# Patient Record
Sex: Male | Born: 1960 | Race: Black or African American | Hispanic: No | Marital: Married | Smoking: Former smoker
Health system: Southern US, Community
[De-identification: ages and names within clinical notes are randomized; demographics above are authoritative.]

## PROBLEM LIST (undated history)

## (undated) DIAGNOSIS — Z5189 Encounter for other specified aftercare: Secondary | ICD-10-CM

## (undated) DIAGNOSIS — B1920 Unspecified viral hepatitis C without hepatic coma: Secondary | ICD-10-CM

## (undated) DIAGNOSIS — E119 Type 2 diabetes mellitus without complications: Secondary | ICD-10-CM

## (undated) DIAGNOSIS — K760 Fatty (change of) liver, not elsewhere classified: Secondary | ICD-10-CM

## (undated) DIAGNOSIS — E785 Hyperlipidemia, unspecified: Secondary | ICD-10-CM

## (undated) DIAGNOSIS — I10 Essential (primary) hypertension: Secondary | ICD-10-CM

## (undated) DIAGNOSIS — D126 Benign neoplasm of colon, unspecified: Secondary | ICD-10-CM

## (undated) HISTORY — DX: Hyperlipidemia, unspecified: E78.5

## (undated) HISTORY — DX: Fatty (change of) liver, not elsewhere classified: K76.0

## (undated) HISTORY — DX: Benign neoplasm of colon, unspecified: D12.6

## (undated) HISTORY — DX: Encounter for other specified aftercare: Z51.89

## (undated) HISTORY — DX: Unspecified viral hepatitis C without hepatic coma: B19.20

## (undated) HISTORY — PX: COLONOSCOPY: SHX174

## (undated) HISTORY — DX: Type 2 diabetes mellitus without complications: E11.9

## (undated) HISTORY — DX: Essential (primary) hypertension: I10

## (undated) HISTORY — PX: POLYPECTOMY: SHX149

---

## 1998-08-06 ENCOUNTER — Emergency Department (HOSPITAL_COMMUNITY): Admission: EM | Admit: 1998-08-06 | Discharge: 1998-08-06 | Payer: Self-pay | Admitting: Emergency Medicine

## 1998-08-07 ENCOUNTER — Encounter: Payer: Self-pay | Admitting: Emergency Medicine

## 1998-08-13 ENCOUNTER — Ambulatory Visit (HOSPITAL_COMMUNITY): Admission: RE | Admit: 1998-08-13 | Discharge: 1998-08-13 | Payer: Self-pay | Admitting: Orthopedic Surgery

## 2001-04-30 ENCOUNTER — Encounter: Admission: RE | Admit: 2001-04-30 | Discharge: 2001-04-30 | Payer: Self-pay | Admitting: Family Medicine

## 2001-05-02 ENCOUNTER — Encounter: Admission: RE | Admit: 2001-05-02 | Discharge: 2001-05-02 | Payer: Self-pay | Admitting: Family Medicine

## 2005-07-11 ENCOUNTER — Emergency Department (HOSPITAL_COMMUNITY): Admission: EM | Admit: 2005-07-11 | Discharge: 2005-07-11 | Payer: Self-pay | Admitting: Emergency Medicine

## 2007-06-26 ENCOUNTER — Emergency Department (HOSPITAL_COMMUNITY): Admission: EM | Admit: 2007-06-26 | Discharge: 2007-06-26 | Payer: Self-pay | Admitting: Emergency Medicine

## 2007-09-27 ENCOUNTER — Encounter: Payer: Self-pay | Admitting: Gastroenterology

## 2007-10-15 ENCOUNTER — Encounter: Payer: Self-pay | Admitting: Gastroenterology

## 2008-02-21 ENCOUNTER — Ambulatory Visit: Payer: Self-pay | Admitting: Gastroenterology

## 2008-07-08 ENCOUNTER — Ambulatory Visit: Payer: Self-pay | Admitting: Gastroenterology

## 2008-12-01 ENCOUNTER — Ambulatory Visit: Payer: Self-pay | Admitting: Gastroenterology

## 2008-12-01 DIAGNOSIS — K921 Melena: Secondary | ICD-10-CM | POA: Insufficient documentation

## 2008-12-01 DIAGNOSIS — D509 Iron deficiency anemia, unspecified: Secondary | ICD-10-CM | POA: Insufficient documentation

## 2008-12-01 DIAGNOSIS — B182 Chronic viral hepatitis C: Secondary | ICD-10-CM | POA: Insufficient documentation

## 2008-12-29 ENCOUNTER — Telehealth: Payer: Self-pay | Admitting: Gastroenterology

## 2009-02-10 ENCOUNTER — Ambulatory Visit: Payer: Self-pay | Admitting: Gastroenterology

## 2011-07-22 ENCOUNTER — Ambulatory Visit (INDEPENDENT_AMBULATORY_CARE_PROVIDER_SITE_OTHER): Payer: BC Managed Care – PPO

## 2011-07-22 DIAGNOSIS — Z833 Family history of diabetes mellitus: Secondary | ICD-10-CM

## 2011-07-22 DIAGNOSIS — R12 Heartburn: Secondary | ICD-10-CM

## 2011-07-22 DIAGNOSIS — I10 Essential (primary) hypertension: Secondary | ICD-10-CM

## 2011-08-06 ENCOUNTER — Ambulatory Visit (INDEPENDENT_AMBULATORY_CARE_PROVIDER_SITE_OTHER): Payer: BC Managed Care – PPO

## 2011-08-06 DIAGNOSIS — E789 Disorder of lipoprotein metabolism, unspecified: Secondary | ICD-10-CM

## 2011-08-06 DIAGNOSIS — M542 Cervicalgia: Secondary | ICD-10-CM

## 2011-08-06 DIAGNOSIS — B1920 Unspecified viral hepatitis C without hepatic coma: Secondary | ICD-10-CM

## 2011-08-11 ENCOUNTER — Ambulatory Visit (INDEPENDENT_AMBULATORY_CARE_PROVIDER_SITE_OTHER): Payer: BC Managed Care – PPO

## 2011-08-11 DIAGNOSIS — R1011 Right upper quadrant pain: Secondary | ICD-10-CM

## 2011-08-11 DIAGNOSIS — B1920 Unspecified viral hepatitis C without hepatic coma: Secondary | ICD-10-CM

## 2011-08-11 DIAGNOSIS — S139XXA Sprain of joints and ligaments of unspecified parts of neck, initial encounter: Secondary | ICD-10-CM

## 2011-08-11 DIAGNOSIS — Z23 Encounter for immunization: Secondary | ICD-10-CM

## 2011-08-11 DIAGNOSIS — Z0000 Encounter for general adult medical examination without abnormal findings: Secondary | ICD-10-CM

## 2011-08-23 ENCOUNTER — Other Ambulatory Visit: Payer: Self-pay | Admitting: Internal Medicine

## 2011-08-23 DIAGNOSIS — R911 Solitary pulmonary nodule: Secondary | ICD-10-CM

## 2011-08-24 ENCOUNTER — Telehealth: Payer: Self-pay

## 2011-08-24 DIAGNOSIS — I10 Essential (primary) hypertension: Secondary | ICD-10-CM

## 2011-08-24 NOTE — Telephone Encounter (Signed)
Patient is requesting refill on his blood pressure medication.

## 2011-08-25 MED ORDER — LISINOPRIL-HYDROCHLOROTHIAZIDE 10-12.5 MG PO TABS: 1.0000 | ORAL_TABLET | Freq: Every day | ORAL | Status: DC

## 2011-08-30 ENCOUNTER — Other Ambulatory Visit: Payer: Self-pay

## 2011-08-30 ENCOUNTER — Ambulatory Visit
Admission: RE | Admit: 2011-08-30 | Discharge: 2011-08-30 | Disposition: A | Discharge status: Home / Self Care | Payer: Self-pay | Source: Ambulatory Visit | Attending: Internal Medicine | Admitting: Internal Medicine

## 2011-08-30 DIAGNOSIS — R911 Solitary pulmonary nodule: Secondary | ICD-10-CM

## 2011-08-30 MED ORDER — IOHEXOL 300 MG/ML  SOLN
75.0000 mL | Freq: Once | INTRAMUSCULAR | Status: AC | PRN
  Administered 2011-08-30: 75 mL via INTRAVENOUS

## 2011-09-05 ENCOUNTER — Telehealth: Payer: Self-pay

## 2011-09-05 NOTE — Telephone Encounter (Signed)
.  [facility] PT HAD LAB WORK DONE AND WOULD LIKE TO KNOW RESULTS PLEASE CALL [phone]

## 2011-09-05 NOTE — Telephone Encounter (Signed)
Message copied by Kerney Elbe on Mon Sep 05, 2011 12:06 PM ------      Message from: Jonita Albee      Created: Sun Sep 04, 2011  5:21 PM       Please pull chart

## 2011-09-06 NOTE — Telephone Encounter (Signed)
Called [facility] at Dr Ernestene Mention request to have results of test faxed. Received results and gave to Dr Perrin Maltese for review.

## 2011-09-13 ENCOUNTER — Ambulatory Visit (INDEPENDENT_AMBULATORY_CARE_PROVIDER_SITE_OTHER): Payer: BC Managed Care – PPO | Admitting: Internal Medicine

## 2011-09-13 VITALS — BP 155/85 | HR 71 | Temp 98.1°F | Resp 16 | Ht 70.25 in | Wt 195.0 lb

## 2011-09-13 DIAGNOSIS — B182 Chronic viral hepatitis C: Secondary | ICD-10-CM

## 2011-09-13 DIAGNOSIS — Z23 Encounter for immunization: Secondary | ICD-10-CM

## 2011-09-13 DIAGNOSIS — B1920 Unspecified viral hepatitis C without hepatic coma: Secondary | ICD-10-CM

## 2011-09-13 NOTE — Progress Notes (Signed)
  Subjective:    Patient ID: Alejandro [name], male    DOB: May 16, 1961, 51 y.o.   MRN: [id number]  HPI  Needs 2nd Hep B vac.  Review of Systems     Objective:   Physical Exam        Assessment & Plan:   Ordered Hep B vac.  Referral to Hep C clinic in process.

## 2012-01-19 ENCOUNTER — Ambulatory Visit (INDEPENDENT_AMBULATORY_CARE_PROVIDER_SITE_OTHER): Payer: BC Managed Care – PPO | Admitting: Gastroenterology

## 2012-01-19 DIAGNOSIS — B182 Chronic viral hepatitis C: Secondary | ICD-10-CM

## 2012-02-09 NOTE — Progress Notes (Signed)
NAME:  TRAYE, BATES  MR#:  [id number]      DATE:  01/19/2012  DOB:  [birth year]    cc: Primary Care Physician: Same Referring Physician: Robert Bellow, MD, [facility], [address] [place], [place] [address], Fax [phone]    REASON FOR VISIT:  Follow up of genotype 1b hepatitis C.   HISTORY:  It should be noted that the patient is a poor historian. For example, he has been followed by [place] for his genotype 1b hepatitis C with last appointment on 07/08/2008. He was unaware that he was seen here in the past until I reminded him. It will be recalled that he was first seen in our clinic for genotype 1b hepatitis C on 02/21/2008, having been first found to be hepatitis C antibody positive on routine lab work in December 2008. He was genotyped. Treatment was discussed with him at the initial appointment. He returned to see [place] in followup on 07/08/2008, when seen by the nurse practitioner in followup. A liver biopsy was never performed but a hepatitis C virus FibroSURE was performed, which suggested a fibrosis score of 0.4, equivalent to F1-F2 fibrosis. He had declined a liver biopsy. Given the lack of fibrosis on his fiber FibroSURE test, treatment was deferred and he was suppose to come back in a years' time. There is no documentation as to what occurred and he, as mentioned above, did not even remember, he was seen by [place] when I first started discussing his diagnosis.   Currently he has no symptoms referable to his history of hepatitis C. I gather he has been referred back simply because his hepatitis C has not been addressed to date rather than any specific new problem.   PAST MEDICAL HISTORY:  Otherwise significant for hypertension. He previously had a history of iron-deficiency anemia for which he underwent a colonoscopy and endoscopy sometime around 2008 or 2009.   CURRENT MEDICATIONS:  Lisinopril/hydrochlorothiazide 10/12.5 mg daily.   ALLERGIES:  Denies.   HABITS:    Smokes on occasion, rarely consumes alcohol.   REVIEW OF SYSTEMS:  All 10 systems reviewed today on the review of systems form, which was signed and placed in the chart. His CES-D was 4.   PHYSICAL EXAMINATION:  Constitutional: Well appearing without stigmata of chronic liver disease. Vital Signs: Height 69 inches, weight 197 pounds, blood pressure 143/106, pulse of 70, temperature 97.6 Fahrenheit. Ears, Nose, Mouth and Throat:  Unremarkable oropharynx.  No thyromegaly or neck masses.  Chest:  Resonant to percussion.  Clear to auscultation.  Cardiovascular:  Heart sounds normal S1, S2 without murmurs or rubs.  There is no peripheral edema.  Abdomen:  Normal bowel sounds.  No masses or tenderness.  I could not appreciate a liver edge or spleen tip.  I could not appreciate any hernias.  Lymphatics:  No cervical or inguinal lymphadenopathy.  Central Nervous System:  No asterixis or focal neurologic findings.  Dermatologic:  Anicteric without palmar erythema or spider angiomata.  Eyes:  Anicteric sclerae.  Pupils are equal and reactive to light.  laboratories:  Most recent labs in the referral notes from 07/23/2011, his AST was 126, ALT 118, ALP 67, total bilirubin 0.6, albumin 4.3, creatinine 1.03.   On 08/07/2011, his HCV RNA was 325,565 international units per mL, and his genotype was repeated to find again that it was 1b.   His last CBC, that I received was 07/22/2011, with a platelet count of 248.   ASSESSMENT:  The patient is a 51 year old gentleman with history of genotype 1b hepatitis C with a previous liver FibroSURE testing on 07/08/2008, suggesting F1-F2 fibrosis, but he has never been biopsied and he is naive to treatment. I do not see any contraindication to treating him, but at this time now, I am deferring therapy on most patients until the availability of sofosbuvir, which will dramatically reduce the length of treatment and in addition to improving the response rates to therapy.  Otherwise, it would seem that the patient needs vaccination against hepatitis A. He was previously negative for the total A antibody. He brings evidence today that he is on a schedule to finish his hepatitis B vaccination by July 2013.   In my discussion today with the patient, I have discussed his previous lab findings and their significance. We discussed the possibility of a liver biopsy versus waiting until the availability of the next generation direct acting antivirals. We discussed treatment, with direct acting antivirals and the response, after December 2014. He was content to wait.   PLAN:  1. Hepatitis A vaccine #1 given today.  2. He will need to return in 6 months' time for his second hepatitis A vaccine.  3. He is completing his hepatitis B series.  4. I did not draw any lab work today as is there is no need to.  5. He is to return in early 2014 to review his therapeutic options at that time.               Brooke Dare, MD   [phone]  D:  Thu Jun 27 18:57:07 2013 ; T:  Thu Jun 27 22:46:52 2013  Job #:  [id number]

## 2012-02-26 ENCOUNTER — Ambulatory Visit (INDEPENDENT_AMBULATORY_CARE_PROVIDER_SITE_OTHER): Payer: BC Managed Care – PPO | Admitting: Family Medicine

## 2012-02-26 ENCOUNTER — Ambulatory Visit: Payer: BC Managed Care – PPO

## 2012-02-26 VITALS — BP 146/80 | HR 76 | Temp 98.4°F | Resp 12 | Ht 69.5 in | Wt 198.0 lb

## 2012-02-26 DIAGNOSIS — M549 Dorsalgia, unspecified: Secondary | ICD-10-CM

## 2012-02-26 DIAGNOSIS — Z23 Encounter for immunization: Secondary | ICD-10-CM

## 2012-02-26 MED ORDER — METHOCARBAMOL 750 MG PO TABS: 750.0000 mg | ORAL_TABLET | Freq: Four times a day (QID) | ORAL | Status: AC

## 2012-02-26 MED ORDER — HEPATITIS B VAC RECOMBINANT 5 MCG/0.5ML IJ SUSP
0.5000 mL | Freq: Once | INTRAMUSCULAR | Status: AC
  Administered 2012-02-26: 5 ug via INTRAMUSCULAR

## 2012-02-26 NOTE — Progress Notes (Signed)
[facility] [address] [place] [address] [phone]  Date:  02/26/2012   Name:  Alejandro [name]   DOB:  07-31-1960   MRN:  [id number]  PCP:  Tally Due, MD    Chief Complaint: Back Pain   History of Present Illness:  Alejandro [name] is a 51 y.o. very pleasant male patient who presents with the following:  This past Monday (today is Sunday) he helped his daughter to move to a 3rd floor apartment.  He "overdid it" and hurt his back.  He noted pain while moving the furniture if he moved a certain way.  He continues to have pain in his lower back, but it does not radiate to his legs.  No numbness or weakness in his legs, no incontinence. He has occasionally had a backache with certain activities such as refinishing a floor, but never this bad.  He has used some ibuprofen- however this did not seem to help much.  Josph also has a history of Hep C.  He is being seen by the ID clinic.  He is in the process of completing his Hep B and Hep A vaccine series.  He had Hep B vaccine in 3/10, and then restarted the series with doses in January and February of this year.  He just had his 2nd Hep A shot at the ID clinic in June  Patient Active Problem List  Diagnosis  . HEPATITIS C-CHRONIC WITHOUT COMA  . ANEMIA, IRON DEFICIENCY  . BLOOD IN STOOL    Past Medical History  Diagnosis Date  . Hepatitis C   . Hypertension     No past surgical history on file.  History  Substance Use Topics  . Smoking status: Current Some Day Smoker  . Smokeless tobacco: Never Used  . Alcohol Use: Not on file    No family history on file.  Allergies  Allergen Reactions  . Tylenol (Acetaminophen) Hypertension    Medication list has been reviewed and updated.  Current Outpatient Prescriptions on File Prior to Visit  Medication Sig Dispense Refill  . lisinopril-hydrochlorothiazide (PRINZIDE,ZESTORETIC) 10-12.5 MG per tablet Take 1 tablet by mouth daily.  90  tablet  3    Review of Systems:  As per HPI- otherwise negative.   Physical Examination: Filed Vitals:   02/26/12 1142  BP: 146/80  Pulse: 76  Temp: 98.4 F (36.9 C)  Resp: 12   Filed Vitals:   02/26/12 1142  Height: 5' 9.5" (1.765 m)  Weight: 198 lb (89.812 kg)   Body mass index is 28.82 kg/(m^2). Ideal Body Weight: Weight in (lb) to have BMI = 25: 171.4   GEN: WDWN, NAD, Non-toxic, A & O x 3 HEENT: Atraumatic, Normocephalic. Neck supple. No masses, No LAD. Ears and Nose: No external deformity. CV: RRR, No M/G/R. No JVD. No thrill. No extra heart sounds. PULM: CTA B, no wheezes, crackles, rhonchi. No retractions. No resp. distress. No accessory muscle use. ABD: S, NT, ND, +BS. No rebound. No HSM. EXTR: No c/c/e.  NEURO Normal gait.  Normal strength and sensation both legs, normal patellar DTR bilaterally. Negative straight leg raise.  He has tenderness in his lumbar and thoracic Paraspinous muscles, and stiffness with flexion and extension.  PSYCH: Normally interactive. Conversant. Not depressed or anxious appearing.  Calm demeanor.   [facility] reading (PRIMARY) by  Dr. Patsy Lager. L spine: negative T spine: negative  THORACIC SPINE - 2 VIEW  Comparison: No priors.  Findings: AP, lateral and swimmers lateral views of the thoracic spine demonstrate no definite acute displaced fractures or compression type fractures. Alignment is anatomic. Visualized portions of the thorax are unremarkable.  IMPRESSION: 1. No acute radiographic abnormality of the thoracic spine to account for the patient's symptoms.  LUMBAR SPINE - 2-3 VIEW  Comparison: None  Findings: There is no evidence of lumbar spine fracture. Alignment is normal. Intervertebral disc spaces are maintained. Multiple stones identified within the gallbladder.  IMPRESSION:  1. No acute lumbar spine findings. 2. Gallstones.  Assessment and Plan: 1. Back pain  DG Lumbar Spine 2-3 Views, DG Thoracic Spine 2  View, methocarbamol (ROBAXIN-750) 750 MG tablet  2. Viral hepatitis vaccination  hepatitis B vac recombinant (RECOMBIVAX) injection 5 mcg   Will treat with robaxin as above for a back strain- this should be safe with his history of elevated LFTs, and he has used it in the past.  Let me know if he is not better within a few days- Sooner if worse.   Gave Hep B number 3- I had actually not been aware that he received another dose in February of this year, which would have equaled 3 doses counting his dose in 2010.  However, the extra dose will not be harmful to him.  Let him know about this extra dose and discussed the gallstones noted on his xray above. Explained that he needs to watch out for pain after eating, especially high fat foods as this may indicate gallbladder pain.   Abbe Amsterdam, MD

## 2012-10-04 ENCOUNTER — Other Ambulatory Visit: Payer: Self-pay | Admitting: Physician Assistant

## 2013-01-13 ENCOUNTER — Ambulatory Visit (INDEPENDENT_AMBULATORY_CARE_PROVIDER_SITE_OTHER): Payer: BC Managed Care – PPO | Admitting: Family Medicine

## 2013-01-13 VITALS — BP 152/96 | HR 91 | Temp 99.0°F | Resp 18 | Wt 202.0 lb

## 2013-01-13 DIAGNOSIS — B86 Scabies: Secondary | ICD-10-CM

## 2013-01-13 MED ORDER — IVERMECTIN 3 MG PO TABS: 3.0000 mg | ORAL_TABLET | Freq: Once | ORAL | Status: DC

## 2013-01-13 MED ORDER — PREDNISONE 20 MG PO TABS: ORAL_TABLET | ORAL | Status: DC

## 2013-01-13 NOTE — Patient Instructions (Addendum)
Scabies  Scabies are small bugs (mites) that burrow under the skin and cause red bumps and severe itching. These bugs can only be seen with a microscope. Scabies are highly contagious. They can spread easily from person to person by direct contact. They are also spread through sharing clothing or linens that have the scabies mites living in them. It is not unusual for an entire family to become infected through shared towels, clothing, or bedding.   HOME CARE INSTRUCTIONS   · Your caregiver may prescribe a cream or lotion to kill the mites. If cream is prescribed, massage the cream into the entire body from the neck to the bottom of both feet. Also massage the cream into the scalp and face if your child is less than 1 year old. Avoid the eyes and mouth. Do not wash your hands after application.  · Leave the cream on for 8 to 12 hours. Your child should bathe or shower after the 8 to 12 hour application period. Sometimes it is helpful to apply the cream to your child right before bedtime.  · One treatment is usually effective and will eliminate approximately 95% of infestations. For severe cases, your caregiver may decide to repeat the treatment in 1 week. Everyone in your household should be treated with one application of the cream.  · New rashes or burrows should not appear within 24 to 48 hours after successful treatment. However, the itching and rash may last for 2 to 4 weeks after successful treatment. Your caregiver may prescribe a medicine to help with the itching or to help the rash go away more quickly.  · Scabies can live on clothing or linens for up to 3 days. All of your child's recently used clothing, towels, stuffed toys, and bed linens should be washed in hot water and then dried in a dryer for at least 20 minutes on high heat. Items that cannot be washed should be enclosed in a plastic bag for at least 3 days.  · To help relieve itching, bathe your child in a cool bath or apply cool washcloths to the  affected areas.  · Your child may return to school after treatment with the prescribed cream.  SEEK MEDICAL CARE IF:   · The itching persists longer than 4 weeks after treatment.  · The rash spreads or becomes infected. Signs of infection include red blisters or yellow-tan crust.  Document Released: 07/11/2005 Document Revised: 10/03/2011 Document Reviewed: 11/19/2008  Exit[employer]® Patient Information ©2014 [employer].

## 2013-01-13 NOTE — Progress Notes (Signed)
52 year old gentleman with 2 weeks of itching on his forearms and in genitalia and lower abdomen. He's not had any recent sexual relations. This itching developed after working in the yard and patient originally but he had poison ivy, but he never developed full blown poison ivy rash.  Objective: A couple small interdigital papules, a few scattered papules on the forearms, and scattered papules in the inguinal and groin region.  Assessment: This is most consistent with scabies.  Scabies - Plan: ivermectin (STROMECTOL) 3 MG TABS, predniSONE (DELTASONE) 20 MG tablet  Signed, Elvina Sidle, MD

## 2013-01-14 ENCOUNTER — Telehealth: Payer: Self-pay

## 2013-01-14 NOTE — Telephone Encounter (Signed)
Which

## 2013-01-14 NOTE — Telephone Encounter (Signed)
Spoke to him about the medication, he is advised to take all four at once.

## 2013-01-14 NOTE — Telephone Encounter (Signed)
PT WOULD LIKE TO KNOW THE DIRECTIONS ON HOW TO TAKE HIS MEDICINE. PLEASE CALL [phone]

## 2013-01-17 ENCOUNTER — Telehealth: Payer: Self-pay

## 2013-01-17 NOTE — Telephone Encounter (Signed)
Pt called and is concerned that the medication that we gave him is making his tongue white. He wants to make sure that this is a normal side effect. Please call  [phone]

## 2013-01-18 NOTE — Telephone Encounter (Signed)
I do not think this is a SE - would recommend an OV

## 2013-01-18 NOTE — Telephone Encounter (Signed)
Pt advised.

## 2013-02-21 ENCOUNTER — Other Ambulatory Visit: Payer: Self-pay | Admitting: Physician Assistant

## 2013-02-24 NOTE — Telephone Encounter (Signed)
Patient is requesting a refill on his Lisinopril states he just went to [facility] and they told him there was no refill for him. Patient would like [place] to send in a refill to [facility] @ [address]. Thanks

## 2013-02-25 ENCOUNTER — Telehealth: Payer: Self-pay

## 2013-02-25 MED ORDER — LISINOPRIL-HYDROCHLOROTHIAZIDE 10-12.5 MG PO TABS: 1.0000 | ORAL_TABLET | Freq: Every day | ORAL | Status: DC

## 2013-02-25 NOTE — Telephone Encounter (Signed)
This was done on 7/31 called him to advise. Resent. He needs appt.

## 2013-02-25 NOTE — Telephone Encounter (Signed)
Patient needs a refill on Lisinopril. [name] [address].  [phone]

## 2013-04-12 ENCOUNTER — Telehealth: Payer: Self-pay

## 2013-04-12 MED ORDER — LISINOPRIL-HYDROCHLOROTHIAZIDE 10-12.5 MG PO TABS: 1.0000 | ORAL_TABLET | Freq: Every day | ORAL | Status: DC

## 2013-04-12 NOTE — Telephone Encounter (Signed)
Pt needs refill on lisinopril, blood pressure rx,.  Uses [facility] on [address].  Last time had a 3 month supply that was less expensive. Would like to do that again if possible.  Call at [phone].

## 2013-04-12 NOTE — Telephone Encounter (Signed)
The last Rx was for #30, because the patient is due for a refill. #90 sent, but need OV for additional refills.  Meds ordered this encounter  Medications  . lisinopril-hydrochlorothiazide (PRINZIDE,ZESTORETIC) 10-12.5 MG per tablet    Sig: Take 1 tablet by mouth daily. Needs office visit/labs, 2nd notice    Dispense:  90 tablet    Refill:  0    Order Specific Question:  Supervising Provider    Answer:  DOOLITTLE, ROBERT P [[id number]]

## 2013-04-12 NOTE — Telephone Encounter (Signed)
Spoke with pt advised to RTC but #90 was sent to pharmacy.

## 2013-07-31 ENCOUNTER — Telehealth: Payer: Self-pay

## 2013-07-31 NOTE — Telephone Encounter (Signed)
Patient states that he needs a refill on Lisinopril sent to [facility] on [address]

## 2013-07-31 NOTE — Telephone Encounter (Signed)
No, he needs an office visit. Called him to advise.

## 2013-08-01 ENCOUNTER — Ambulatory Visit (INDEPENDENT_AMBULATORY_CARE_PROVIDER_SITE_OTHER): Payer: BC Managed Care – PPO | Admitting: Emergency Medicine

## 2013-08-01 ENCOUNTER — Encounter: Payer: Self-pay | Admitting: Emergency Medicine

## 2013-08-01 VITALS — BP 142/80 | HR 89 | Temp 98.7°F | Resp 18 | Ht 69.5 in | Wt 207.0 lb

## 2013-08-01 DIAGNOSIS — B1920 Unspecified viral hepatitis C without hepatic coma: Secondary | ICD-10-CM

## 2013-08-01 DIAGNOSIS — I10 Essential (primary) hypertension: Secondary | ICD-10-CM

## 2013-08-01 MED ORDER — LISINOPRIL-HYDROCHLOROTHIAZIDE 20-25 MG PO TABS: 1.0000 | ORAL_TABLET | Freq: Every day | ORAL | Status: DC

## 2013-08-01 MED ORDER — LANSOPRAZOLE 30 MG PO CPDR
30.0000 mg | DELAYED_RELEASE_CAPSULE | Freq: Every day | ORAL | Status: DC
Start: 2013-08-01 — End: 2018-08-02

## 2013-08-01 NOTE — Progress Notes (Signed)
[facility] [address] [place] [address] [phone]  Date:  08/01/2013   Name:  Alejandro [name]   DOB:  30-Apr-1961   MRN:  [id number]  PCP:  Kennon Portela, MD    Chief Complaint: rx refills and Gastrophageal Reflux   History of Present Illness:  Alejandro [name] is a 53 y.o. very pleasant male patient who presents with the following:  Frequent heartburn and now is belching a lot.  Heavy coffee drinker.  Recently started a second job and is drinking a lot of carbonated caffeinated beverages.  Frequently awakening with heartburn.  No waterbrash.  Worse with spicy food and seasonings.  No nausea or vomiting.   Stopped smoking.  No improvement with over the counter medications or other home remedies. Denies other complaint or health concern today.   Patient Active Problem List   Diagnosis Date Noted  . HEPATITIS C-CHRONIC WITHOUT COMA 12/01/2008  . ANEMIA, IRON DEFICIENCY 12/01/2008  . BLOOD IN STOOL 12/01/2008    Past Medical History  Diagnosis Date  . Hepatitis C   . Hypertension     History reviewed. No pertinent past surgical history.  History  Substance Use Topics  . Smoking status: Current Some Day Smoker  . Smokeless tobacco: Never Used  . Alcohol Use: Yes    Family History  Problem Relation Age of Onset  . Heart disease Mother     Allergies  Allergen Reactions  . Tylenol [Acetaminophen] Hypertension    Medication list has been reviewed and updated.  Current Outpatient Prescriptions on File Prior to Visit  Medication Sig Dispense Refill  . lisinopril-hydrochlorothiazide (PRINZIDE,ZESTORETIC) 10-12.5 MG per tablet Take 1 tablet by mouth daily. Needs office visit/labs, 2nd notice  90 tablet  0  . ivermectin (STROMECTOL) 3 MG TABS Take 1 tablet (3 mg total) by mouth once.  4 tablet  0  . predniSONE (DELTASONE) 20 MG tablet 2 daily with food  10 tablet  1   No current facility-administered medications on file prior to  visit.    Review of Systems:  As per HPI, otherwise negative.  3  Physical Examination: Filed Vitals:   08/01/13 1831  BP: 142/80  Pulse: 89  Temp: 98.7 F (37.1 C)  Resp: 18   Filed Vitals:   08/01/13 1831  Height: 5' 9.5" (1.765 m)  Weight: 207 lb (93.895 kg)   Body mass index is 30.14 kg/(m^2). Ideal Body Weight: Weight in (lb) to have BMI = 25: 171.4  GEN: WDWN, NAD, Non-toxic, A & O x 3 HEENT: Atraumatic, Normocephalic. Neck supple. No masses, No LAD. Ears and Nose: No external deformity. CV: RRR, No M/G/R. No JVD. No thrill. No extra heart sounds. PULM: CTA B, no wheezes, crackles, rhonchi. No retractions. No resp. distress. No accessory muscle use. ABD: S, NT, ND, +BS. No rebound. No HSM. EXTR: No c/c/e NEURO Normal gait.  PSYCH: Normally interactive. Conversant. Not depressed or anxious appearing.  Calm demeanor.    Assessment and Plan: GERD Hypertension Future labs Increase lisinoril to 20/25  Signed,  Ellison Carwin, MD

## 2013-11-02 ENCOUNTER — Other Ambulatory Visit (INDEPENDENT_AMBULATORY_CARE_PROVIDER_SITE_OTHER): Payer: BC Managed Care – PPO | Admitting: *Deleted

## 2013-11-02 DIAGNOSIS — B1920 Unspecified viral hepatitis C without hepatic coma: Secondary | ICD-10-CM

## 2013-11-02 DIAGNOSIS — B150 Hepatitis A with hepatic coma: Secondary | ICD-10-CM

## 2013-11-02 DIAGNOSIS — I10 Essential (primary) hypertension: Secondary | ICD-10-CM

## 2013-11-02 LAB — LIPID PANEL
Cholesterol: 139 mg/dL (ref 0–200)
HDL: 37 mg/dL — AB (ref >39)
LDL Cholesterol: 53 mg/dL (ref 0–99)
TRIGLYCERIDES: 246 mg/dL — AB (ref <150)
Total CHOL/HDL Ratio: 3.8 Ratio
VLDL: 49 mg/dL — ABNORMAL HIGH (ref 0–40)

## 2013-11-02 LAB — CBC WITH DIFFERENTIAL/PLATELET

## 2013-11-02 LAB — COMPREHENSIVE METABOLIC PANEL
ALBUMIN: 4 g/dL (ref 3.5–5.2)
ALT: 58 U/L — AB (ref 0–53)
AST: 68 U/L — ABNORMAL HIGH (ref 0–37)
Alkaline Phosphatase: 64 U/L (ref 39–117)
BILIRUBIN TOTAL: 0.4 mg/dL (ref 0.2–1.2)
BUN: 12 mg/dL (ref 6–23)
CHLORIDE: 100 meq/L (ref 96–112)
CO2: 26 meq/L (ref 19–32)
Calcium: 9.3 mg/dL (ref 8.4–10.5)
Creat: 1.09 mg/dL (ref 0.50–1.35)
GLUCOSE: 111 mg/dL — AB (ref 70–99)
POTASSIUM: 4.3 meq/L (ref 3.5–5.3)
SODIUM: 136 meq/L (ref 135–145)
TOTAL PROTEIN: 7.2 g/dL (ref 6.0–8.3)

## 2013-11-02 LAB — PSA: PSA: 0.5 ng/mL (ref <=4.00)

## 2013-11-02 NOTE — Progress Notes (Signed)
Pt here for labs only. 

## 2013-11-03 ENCOUNTER — Telehealth: Payer: Self-pay | Admitting: *Deleted

## 2013-11-04 LAB — H. PYLORI ANTIBODY, IGG: H Pylori IgG: >8 {ISR} — ABNORMAL HIGH

## 2013-11-05 ENCOUNTER — Other Ambulatory Visit: Payer: Self-pay | Admitting: Emergency Medicine

## 2013-11-05 MED ORDER — AMOXICILL-CLARITHRO-LANSOPRAZ PO MISC: Freq: Two times a day (BID) | ORAL | Status: DC

## 2013-11-05 MED ORDER — ATORVASTATIN CALCIUM 20 MG PO TABS: 20.0000 mg | ORAL_TABLET | Freq: Every day | ORAL | Status: DC

## 2013-11-12 NOTE — Telephone Encounter (Signed)
error 

## 2014-01-20 ENCOUNTER — Ambulatory Visit (INDEPENDENT_AMBULATORY_CARE_PROVIDER_SITE_OTHER): Payer: BC Managed Care – PPO | Admitting: Family Medicine

## 2014-01-20 VITALS — BP 124/80 | HR 68 | Temp 98.1°F | Resp 16 | Ht 69.0 in | Wt 207.2 lb

## 2014-01-20 DIAGNOSIS — E785 Hyperlipidemia, unspecified: Secondary | ICD-10-CM

## 2014-01-20 DIAGNOSIS — T148 Other injury of unspecified body region: Secondary | ICD-10-CM

## 2014-01-20 DIAGNOSIS — IMO0002 Reserved for concepts with insufficient information to code with codable children: Secondary | ICD-10-CM

## 2014-01-20 DIAGNOSIS — L03114 Cellulitis of left upper limb: Secondary | ICD-10-CM

## 2014-01-20 DIAGNOSIS — B1920 Unspecified viral hepatitis C without hepatic coma: Secondary | ICD-10-CM

## 2014-01-20 DIAGNOSIS — W57XXXA Bitten or stung by nonvenomous insect and other nonvenomous arthropods, initial encounter: Secondary | ICD-10-CM

## 2014-01-20 LAB — LIPID PANEL
Cholesterol: 136 mg/dL (ref 0–200)
HDL: 46 mg/dL (ref >39)
LDL Cholesterol: 74 mg/dL (ref 0–99)
Total CHOL/HDL Ratio: 3 Ratio
Triglycerides: 80 mg/dL (ref <150)
VLDL: 16 mg/dL (ref 0–40)

## 2014-01-20 LAB — COMPLETE METABOLIC PANEL WITH GFR
ALT: 63 U/L — ABNORMAL HIGH (ref 0–53)
AST: 81 U/L — ABNORMAL HIGH (ref 0–37)
Albumin: 4.3 g/dL (ref 3.5–5.2)
Alkaline Phosphatase: 58 U/L (ref 39–117)
CO2: 28 mEq/L (ref 19–32)
Creat: 1.06 mg/dL (ref 0.50–1.35)
GFR, Est African American: >89 mL/min
Total Bilirubin: 0.4 mg/dL (ref 0.2–1.2)

## 2014-01-20 LAB — COMPLETE METABOLIC PANEL WITHOUT GFR
BUN: 14 mg/dL (ref 6–23)
Calcium: 9.1 mg/dL (ref 8.4–10.5)
Chloride: 101 meq/L (ref 96–112)
GFR, Est Non African American: 80 mL/min
Glucose, Bld: 91 mg/dL (ref 70–99)
Potassium: 5.1 meq/L (ref 3.5–5.3)
Sodium: 135 meq/L (ref 135–145)
Total Protein: 7.1 g/dL (ref 6.0–8.3)

## 2014-01-20 MED ORDER — DOXYCYCLINE HYCLATE 100 MG PO TABS: 100.0000 mg | ORAL_TABLET | Freq: Two times a day (BID) | ORAL | Status: DC

## 2014-01-20 NOTE — Progress Notes (Signed)
Chief Complaint:  Chief Complaint  Patient presents with  . tick bite    Friday, left upper arm is red    HPI: Alejandro [name] is a 53 y.o. male who is here for a Tick bite on his left axilla x 3 days ago He has picked out the tick with the head included, he has also subsequently  Picked at it with tweezers since he thought there was a white material that he msised the firest time He put Alcohol on it and also poison ivy cream without much relief He is here because the area had gotten more red and has expanded Denies fevers or chills, n/n, arm pain or swelling  He has hepatitis, which he has seen a hepatologist for a long time ago but they are no longer here, denies any IVDA,  He thinks he may have gotten it from a blood transfusion in [place] at [facility] when he was a child He was recently put on statin meds about 2-3 months ago and has not come back for f/u of his liver enzymes He is tolerating statins ok, is compliant.   Past Medical History  Diagnosis Date  . Hepatitis C   . Hypertension    History reviewed. No pertinent past surgical history. History   Social History  . Marital Status: Married    Spouse Name: N/A    Number of Children: N/A  . Years of Education: N/A   Social History Main Topics  . Smoking status: Former Research scientist (life sciences)  . Smokeless tobacco: Never Used  . Alcohol Use: Yes  . Drug Use: No  . Sexual Activity: Yes   Other Topics Concern  . None   Social History Narrative  . None   Family History  Problem Relation Age of Onset  . Heart disease Mother    Allergies  Allergen Reactions  . Tylenol [Acetaminophen] Hypertension   Prior to Admission medications   Medication Sig Start Date End Date Taking? Authorizing Benigna Delisi  atorvastatin (LIPITOR) 20 MG tablet Take 1 tablet (20 mg total) by mouth daily. 11/05/13  Yes Ellison Carwin, MD  amoxicillin-clarithromycin-lansoprazole [facility]) combo pack Take by mouth 2 (two) times daily.  Follow package directions.  Please dispense as generic components. 11/05/13   Ellison Carwin, MD  ivermectin (STROMECTOL) 3 MG TABS Take 1 tablet (3 mg total) by mouth once. 01/13/13   Robyn Haber, MD  lansoprazole (PREVACID) 30 MG capsule Take 1 capsule (30 mg total) by mouth daily at 12 noon. 08/01/13   Ellison Carwin, MD  lisinopril-hydrochlorothiazide (PRINZIDE,ZESTORETIC) 20-25 MG per tablet Take 1 tablet by mouth daily. 08/01/13   Ellison Carwin, MD  predniSONE (DELTASONE) 20 MG tablet 2 daily with food 01/13/13   Robyn Haber, MD     ROS: The patient denies fevers, chills, night sweats, unintentional weight loss, chest pain, palpitations, wheezing, dyspnea on exertion, nausea, vomiting, abdominal pain, dysuria, hematuria, melena, numbness, weakness, or tingling.   All other systems have been reviewed and were otherwise negative with the exception of those mentioned in the HPI and as above.    PHYSICAL EXAM: Filed Vitals:   01/20/14 0924  BP: 124/80  Pulse: 68  Temp: 98.1 F (36.7 C)  Resp: 16   Filed Vitals:   01/20/14 0924  Height: 5\' 9"  (1.753 m)  Weight: 207 lb 3.2 oz (93.985 kg)   Body mass index is 30.58 kg/(m^2).  General: Alert, no acute distress HEENT:  Normocephalic, atraumatic, oropharynx patent.  EOMI, PERRLA Cardiovascular:  Regular rate and rhythm, no rubs murmurs or gallops.  No Carotid bruits, radial pulse intact. No pedal edema.  Respiratory: Clear to auscultation bilaterally.  No wheezes, rales, or rhonchi.  No cyanosis, no use of accessory musculature GI: No organomegaly, abdomen is soft and non-tender, positive bowel sounds.  No masses. Skin: + cellulitic rash left axilla, no abscess Neurologic: Facial musculature symmetric. Psychiatric: Patient is appropriate throughout our interaction. Lymphatic: No cervical lymphadenopathy Musculoskeletal: Gait intact.   LABS: Results for orders placed in visit on 11/02/13  CBC WITH DIFFERENTIAL      Result  Value Ref Range   WBC TEST NOT PERFORMED  4.0 - 10.5 K/uL   RBC TEST NOT PERFORMED  4.22 - 5.81 MIL/uL   Hemoglobin TEST NOT PERFORMED  13.0 - 17.0 g/dL   HCT TEST NOT PERFORMED  39.0 - 52.0 %   MCV TEST NOT PERFORMED  78.0 - 100.0 fL   MCH TEST NOT PERFORMED  26.0 - 34.0 pg   MCHC TEST NOT PERFORMED  30.0 - 36.0 g/dL   RDW TEST NOT PERFORMED  11.5 - 15.5 %   Platelets TEST NOT PERFORMED  150 - 400 K/uL   Neutrophils Relative % TEST NOT PERFORMED  43 - 77 %   Neutro Abs TEST NOT PERFORMED  1.7 - 7.7 K/uL   Lymphocytes Relative TEST NOT PERFORMED  12 - 46 %   Lymphs Abs TEST NOT PERFORMED  0.7 - 4.0 K/uL   Monocytes Relative TEST NOT PERFORMED  3 - 12 %   Monocytes Absolute TEST NOT PERFORMED  0.1 - 1.0 K/uL   Eosinophils Relative TEST NOT PERFORMED  0 - 5 %   Eosinophils Absolute TEST NOT PERFORMED  0.0 - 0.7 K/uL   Basophils Relative TEST NOT PERFORMED  0 - 1 %   Basophils Absolute TEST NOT PERFORMED  0.0 - 0.1 K/uL   Smear Review TEST NOT PERFORMED    COMPREHENSIVE METABOLIC PANEL      Result Value Ref Range   Sodium 136  135 - 145 mEq/L   Potassium 4.3  3.5 - 5.3 mEq/L   Chloride 100  96 - 112 mEq/L   CO2 26  19 - 32 mEq/L   Glucose, Bld 111 (*) 70 - 99 mg/dL   BUN 12  6 - 23 mg/dL   Creat 1.09  0.50 - 1.35 mg/dL   Total Bilirubin 0.4  0.2 - 1.2 mg/dL   Alkaline Phosphatase 64  39 - 117 U/L   AST 68 (*) 0 - 37 U/L   ALT 58 (*) 0 - 53 U/L   Total Protein 7.2  6.0 - 8.3 g/dL   Albumin 4.0  3.5 - 5.2 g/dL   Calcium 9.3  8.4 - 10.5 mg/dL  LIPID PANEL      Result Value Ref Range   Cholesterol 139  0 - 200 mg/dL   Triglycerides 246 (*) <150 mg/dL   HDL 37 (*) >39 mg/dL   Total CHOL/HDL Ratio 3.8     VLDL 49 (*) 0 - 40 mg/dL   LDL Cholesterol 53  0 - 99 mg/dL  PSA      Result Value Ref Range   PSA 0.50  <=4.00 ng/mL  H. PYLORI ANTIBODY, IGG      Result Value Ref Range   H Pylori IgG >8.00 (*)      EKG/XRAY:   Primary read interpreted by Dr. Marin Comment at  [facility].  ASSESSMENT/PLAN: Encounter Diagnoses  Name Primary?  . Tick bite Yes  . Other and unspecified hyperlipidemia   . Hepatitis C virus infection without hepatic coma, unspecified chronicity   . Cellulitis of left upper extremity    Doxycycline 100 mg BID for tick bite Refer to [place] pending: CMP I am worried that he is on a statin while having hep c that has been unmanaged/treated. He has high TGs and low HDL but TC and LDL are WNL. May consider taking him off statin if LFTs are elevated F/u prn  Gross sideeffects, risk and benefits, and alternatives of medications d/w patient. Patient is aware that all medications have potential sideeffects and we are unable to predict every sideeffect or drug-drug interaction that may occur.  LE, [place], DO 01/20/2014 2:21 PM

## 2014-01-21 ENCOUNTER — Encounter: Payer: Self-pay | Admitting: Family Medicine

## 2014-02-03 ENCOUNTER — Other Ambulatory Visit: Payer: BC Managed Care – PPO

## 2014-08-27 ENCOUNTER — Other Ambulatory Visit: Payer: Self-pay | Admitting: Emergency Medicine

## 2014-08-31 ENCOUNTER — Other Ambulatory Visit: Payer: Self-pay | Admitting: Emergency Medicine

## 2014-09-14 ENCOUNTER — Emergency Department (HOSPITAL_COMMUNITY)
Admission: EM | Admit: 2014-09-14 | Discharge: 2014-09-14 | Disposition: A | Discharge status: Home / Self Care | Payer: BC Managed Care – PPO | Attending: Emergency Medicine | Admitting: Emergency Medicine

## 2014-09-14 ENCOUNTER — Ambulatory Visit (INDEPENDENT_AMBULATORY_CARE_PROVIDER_SITE_OTHER): Payer: BC Managed Care – PPO

## 2014-09-14 ENCOUNTER — Encounter (HOSPITAL_COMMUNITY): Payer: Self-pay | Admitting: Emergency Medicine

## 2014-09-14 ENCOUNTER — Ambulatory Visit (INDEPENDENT_AMBULATORY_CARE_PROVIDER_SITE_OTHER): Payer: BC Managed Care – PPO | Admitting: Emergency Medicine

## 2014-09-14 VITALS — BP 142/88 | HR 95 | Temp 98.0°F | Resp 16 | Ht 69.0 in | Wt 212.8 lb

## 2014-09-14 DIAGNOSIS — M5442 Lumbago with sciatica, left side: Secondary | ICD-10-CM

## 2014-09-14 DIAGNOSIS — Z8619 Personal history of other infectious and parasitic diseases: Secondary | ICD-10-CM | POA: Diagnosis not present

## 2014-09-14 DIAGNOSIS — E1165 Type 2 diabetes mellitus with hyperglycemia: Secondary | ICD-10-CM | POA: Insufficient documentation

## 2014-09-14 DIAGNOSIS — K8020 Calculus of gallbladder without cholecystitis without obstruction: Secondary | ICD-10-CM | POA: Insufficient documentation

## 2014-09-14 DIAGNOSIS — I10 Essential (primary) hypertension: Secondary | ICD-10-CM | POA: Diagnosis not present

## 2014-09-14 DIAGNOSIS — Z87891 Personal history of nicotine dependence: Secondary | ICD-10-CM | POA: Diagnosis not present

## 2014-09-14 DIAGNOSIS — R350 Frequency of micturition: Secondary | ICD-10-CM

## 2014-09-14 DIAGNOSIS — Z79899 Other long term (current) drug therapy: Secondary | ICD-10-CM | POA: Insufficient documentation

## 2014-09-14 DIAGNOSIS — R739 Hyperglycemia, unspecified: Secondary | ICD-10-CM | POA: Insufficient documentation

## 2014-09-14 DIAGNOSIS — Z792 Long term (current) use of antibiotics: Secondary | ICD-10-CM | POA: Diagnosis not present

## 2014-09-14 DIAGNOSIS — E119 Type 2 diabetes mellitus without complications: Secondary | ICD-10-CM

## 2014-09-14 LAB — BASIC METABOLIC PANEL
Anion gap: 7 (ref 5–15)
BUN: 23 mg/dL (ref 6–23)
CO2: 28 mmol/L (ref 19–32)
Calcium: 9.1 mg/dL (ref 8.4–10.5)
Chloride: 93 mmol/L — ABNORMAL LOW (ref 96–112)
Creatinine, Ser: 1.31 mg/dL (ref 0.50–1.35)
GFR calc Af Amer: 70 mL/min — ABNORMAL LOW (ref >90)
GFR calc non Af Amer: 60 mL/min — ABNORMAL LOW (ref >90)
Glucose, Bld: 514 mg/dL — ABNORMAL HIGH (ref 70–99)
Potassium: 4.5 mmol/L (ref 3.5–5.1)
SODIUM: 128 mmol/L — AB (ref 135–145)

## 2014-09-14 LAB — COMPREHENSIVE METABOLIC PANEL
ALK PHOS: 81 U/L (ref 39–117)
ALT: 61 U/L — ABNORMAL HIGH (ref 0–53)
ANION GAP: 10 (ref 5–15)
AST: 57 U/L — ABNORMAL HIGH (ref 0–37)
Albumin: 4.8 g/dL (ref 3.5–5.2)
BILIRUBIN TOTAL: 0.7 mg/dL (ref 0.3–1.2)
BUN: 26 mg/dL — ABNORMAL HIGH (ref 6–23)
CALCIUM: 10.1 mg/dL (ref 8.4–10.5)
CHLORIDE: 86 mmol/L — AB (ref 96–112)
CO2: 29 mmol/L (ref 19–32)
Creatinine, Ser: 1.4 mg/dL — ABNORMAL HIGH (ref 0.50–1.35)
GFR calc Af Amer: 64 mL/min — ABNORMAL LOW (ref >90)
GFR, EST NON AFRICAN AMERICAN: 56 mL/min — AB (ref >90)
Glucose, Bld: 713 mg/dL (ref 70–99)
Potassium: 4.9 mmol/L (ref 3.5–5.1)
Sodium: 125 mmol/L — ABNORMAL LOW (ref 135–145)
Total Protein: 8.7 g/dL — ABNORMAL HIGH (ref 6.0–8.3)

## 2014-09-14 LAB — CBC
HCT: 45.5 % (ref 39.0–52.0)
HEMOGLOBIN: 14.9 g/dL (ref 13.0–17.0)
MCH: 28.7 pg (ref 26.0–34.0)
MCHC: 32.7 g/dL (ref 30.0–36.0)
MCV: 87.5 fL (ref 78.0–100.0)
PLATELETS: 221 10*3/uL (ref 150–400)
RBC: 5.2 MIL/uL (ref 4.22–5.81)
RDW: 12.1 % (ref 11.5–15.5)
WBC: 7.2 10*3/uL (ref 4.0–10.5)

## 2014-09-14 LAB — POCT URINALYSIS DIPSTICK
Bilirubin, UA: NEGATIVE
GLUCOSE UA: 500
Ketones, UA: NEGATIVE
Leukocytes, UA: NEGATIVE
NITRITE UA: NEGATIVE
PROTEIN UA: NEGATIVE
Spec Grav, UA: <=1.005
Urobilinogen, UA: 0.2
pH, UA: 6

## 2014-09-14 LAB — POCT CBC
Granulocyte percent: 66.7 %G (ref 37–80)
HEMATOCRIT: 42.9 % — AB (ref 43.5–53.7)
Hemoglobin: 14 g/dL — AB (ref 14.1–18.1)
Lymph, poc: 1.9 (ref 0.6–3.4)
MCH, POC: 28.9 pg (ref 27–31.2)
MCHC: 32.7 g/dL (ref 31.8–35.4)
MCV: 88.4 fL (ref 80–97)
MID (CBC): 0.2 (ref 0–0.9)
MPV: 7.8 fL (ref 0–99.8)
PLATELET COUNT, POC: 186 10*3/uL (ref 142–424)
POC Granulocyte: 4.3 (ref 2–6.9)
POC LYMPH PERCENT: 30.1 %L (ref 10–50)
POC MID %: 3.2 % (ref 0–12)
RBC: 4.86 M/uL (ref 4.69–6.13)
RDW, POC: 12.4 %
WBC: 6.4 10*3/uL (ref 4.6–10.2)

## 2014-09-14 LAB — POCT UA - MICROSCOPIC ONLY
Casts, Ur, LPF, POC: NEGATIVE
Crystals, Ur, HPF, POC: NEGATIVE
Mucus, UA: NEGATIVE
WBC, UR, HPF, POC: NEGATIVE
YEAST UA: NEGATIVE

## 2014-09-14 LAB — CBG MONITORING, ED: Glucose-Capillary: >600 mg/dL (ref 70–99)

## 2014-09-14 LAB — GLUCOSE, POCT (MANUAL RESULT ENTRY)

## 2014-09-14 LAB — POCT GLYCOSYLATED HEMOGLOBIN (HGB A1C): Hemoglobin A1C: 11.1

## 2014-09-14 LAB — ETHANOL

## 2014-09-14 MED ORDER — TRAMADOL HCL 50 MG PO TABS: 50.0000 mg | ORAL_TABLET | Freq: Once | ORAL | Status: DC

## 2014-09-14 MED ORDER — METFORMIN HCL 500 MG PO TABS: 500.0000 mg | ORAL_TABLET | Freq: Two times a day (BID) | ORAL | Status: DC

## 2014-09-14 MED ORDER — SODIUM CHLORIDE 0.9 % IV BOLUS (SEPSIS)
2000.0000 mL | Freq: Once | INTRAVENOUS | Status: AC
  Administered 2014-09-14: 2000 mL via INTRAVENOUS

## 2014-09-14 NOTE — ED Provider Notes (Signed)
CSN: [id number]     Arrival date & time 09/14/14  1252 History   First MD Initiated Contact with Patient 09/14/14 1343     Chief Complaint  Patient presents with  . Hyperglycemia     (Consider location/radiation/quality/duration/timing/severity/associated sxs/prior Treatment) HPI   Alejandro [name] is a 54 y.o. male who presents for evaluation of known hyperglycemia.  He went to an urgent care today to be evaluated for malaise, and leg cramps.  While there, his sugar was found to be high, so he was sent here.  He recalls having urinary frequency but no dysuria or hematuria.  He denies nausea, vomiting, weakness or dizziness.  He thinks he has been eating too many sweets recently.  He has a family history which is positive for diabetes in multiple family members.  His history of hepatitis C but does not take treatments for it.  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  There are no other known modifying factors.   Past Medical History  Diagnosis Date  . Hepatitis C   . Hypertension    History reviewed. No pertinent past surgical history. Family History  Problem Relation Age of Onset  . Heart disease Mother    History  Substance Use Topics  . Smoking status: Former Research scientist (life sciences)  . Smokeless tobacco: Never Used  . Alcohol Use: Yes    Review of Systems  All other systems reviewed and are negative.     Allergies  Tylenol  Home Medications   Prior to Admission medications   Medication Sig Start Date End Date Taking? Authorizing Provider  lisinopril-hydrochlorothiazide (PRINZIDE,ZESTORETIC) 20-25 MG per tablet Take 1 tablet by mouth daily. PATIENT NEEDS OFFICE VISIT FOR ADDITIONAL REFILLS Patient taking differently: Take 1 tablet by mouth daily with breakfast.  08/28/14  Yes Alejandro Culver, MD  naproxen sodium (ANAPROX) 220 MG tablet Take 220-440 mg by mouth daily as needed (for pain).   Yes Historical Provider, MD  atorvastatin (LIPITOR) 20 MG  tablet Take 1 tablet (20 mg total) by mouth daily. Patient not taking: Reported on 09/14/2014 11/05/13   Alejandro Culver, MD  doxycycline (VIBRA-TABS) 100 MG tablet Take 1 tablet (100 mg total) by mouth 2 (two) times daily. Patient not taking: Reported on 09/14/2014 01/20/14   Alejandro P Le, DO  lansoprazole (PREVACID) 30 MG capsule Take 1 capsule (30 mg total) by mouth daily at 12 noon. Patient not taking: Reported on 09/14/2014 08/01/13   Alejandro Culver, MD   BP 148/101 mmHg  Pulse 83  Temp(Src) 98.4 F (36.9 C) (Oral)  Resp 16  SpO2 98% Physical Exam  Constitutional: He is oriented to person, place, and time. He appears well-developed and well-nourished. No distress.  HENT:  Head: Normocephalic and atraumatic.  Right Ear: External ear normal.  Left Ear: External ear normal.  Eyes: Conjunctivae and EOM are normal. Pupils are equal, round, and reactive to light.  Neck: Normal range of motion and phonation normal. Neck supple.  Cardiovascular: Normal rate, regular rhythm and normal heart sounds.   Pulmonary/Chest: Effort normal and breath sounds normal. He exhibits no bony tenderness.  Abdominal: Soft. There is no tenderness.  Musculoskeletal: Normal range of motion.  No nystagmus  Neurological: He is alert and oriented to person, place, and time. No cranial nerve deficit or sensory deficit. He exhibits normal muscle tone. Coordination normal.  Skin: Skin is warm, dry and intact.  Psychiatric: He has a normal mood and affect.  His behavior is normal. Judgment and thought content normal.  Nursing note and vitals reviewed.   ED Course  Procedures (including critical care time)  Medications  sodium chloride 0.9 % bolus 2,000 mL (2,000 mLs Intravenous New Bag/Given 09/14/14 1350)    Patient Vitals for the past 24 hrs:  BP Temp Temp src Pulse Resp SpO2  09/14/14 1257 (!) 148/101 mmHg 98.4 F (36.9 C) Oral 83 16 98 %    3:50 PM Reevaluation with update and discussion. After initial  assessment and treatment, an updated evaluation reveals no additional c/o. Findings discussed with pt., all questions answered. Alejandro [name]    Labs Review Labs Reviewed  COMPREHENSIVE METABOLIC PANEL - Abnormal; Notable for the following:    Sodium 125 (*)    Chloride 86 (*)    Glucose, Bld 713 (*)    BUN 26 (*)    Creatinine, Ser 1.40 (*)    Total Protein 8.7 (*)    AST 57 (*)    ALT 61 (*)    GFR calc non Af Amer 56 (*)    GFR calc Af Amer 64 (*)    All other components within normal limits  CBG MONITORING, ED - Abnormal; Notable for the following:    Glucose-Capillary >600 (*)    All other components within normal limits  CBC  ETHANOL  BASIC METABOLIC PANEL    Imaging Review Dg Lumbar Spine Complete  09/14/2014   CLINICAL DATA:  Low back pain occasionally in LEFT buttock to bottom of foot, no known injury  EXAM: LUMBAR SPINE - COMPLETE 4+ VIEW  COMPARISON:  02/26/2012  FINDINGS: Five non-rib-bearing lumbar vertebrae.  Slight disc space narrowing L4-L5.  Vertebral body and disc space heights otherwise maintained.  No acute fracture, subluxation, or bone destruction.  No spondylolysis.  SI joints symmetric.  Calcified gallstones in RIGHT upper quadrant.  IMPRESSION: Minimal degenerative disc disease changes L4-L5.  No acute lumbar spine abnormalities.  Cholelithiasis.   Electronically Signed   By: Alejandro [name] M.D.   On: 09/14/2014 12:26     EKG Interpretation None      MDM   Final diagnoses:  Hyperglycemia    Hyperglycemia with minimal symptoms.  Likely mild dehydration.  Creatinine levels elevated from baseline.  Chronic elevation of transaminases consistent with diagnosis of hepatitis C.  No evidence for acute hepatitis C problem today.  Patient safe to be be treated as an outpatient.  Initial treatment, in ED is fluid resuscitation.    Nursing Notes Reviewed/ Care Coordinated Applicable Imaging Reviewed Interpretation of Laboratory Data incorporated into ED  treatment  The patient appears reasonably screened and/or stabilized for discharge and I doubt any other medical condition or other [facility] requiring further screening, evaluation, or treatment in the ED at this time prior to discharge.  Plan: Home Medications- Metformin; Home Treatments- drink plenty of fluids, rest; return here if the recommended treatment, does not improve the symptoms; Recommended follow up- PCP of choice 1-2 weeks to establish ongoing care   Richarda Blade, MD 09/14/14 1601

## 2014-09-14 NOTE — ED Notes (Signed)
Nurse drawing labs. 

## 2014-09-14 NOTE — Progress Notes (Addendum)
This chart was scribed for Alejandro [name] by Edison Simon, ED Scribe. This patient was seen in room 12 and the patient's care was started at 10:45 AM.   Subjective:    Patient ID: Alejandro [name], male    DOB: 1961-01-09, 54 y.o.   MRN: [id number]  HPI  HPI Comments: Alejandro [name] is a 54 y.o. male who presents to the [facility] complaining of cramping to left leg and back pain. He denies any problems to his right leg. He states he was treated for back pain some years ago. He states he has been trying to drink water, vinegar, and eat mustard for his leg cramping without remission. 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 He states he last buffed 2 weeks ago. He states he has recently been urinating more and has had nocturia at least twice a night; he states he also has been drinking more water. He denies any difficulty urinating. He states he has not ben checked for diabetes but reports FHx of diabetes. He states his prostate was last checked over 1 year ago.   Review of Systems  Endocrine: Positive for polydipsia and polyuria.  Genitourinary: Positive for frequency. Negative for difficulty urinating.  Musculoskeletal: Positive for back pain.       Leg cramping  All other systems reviewed and are negative.      Objective:   Physical Exam  Nursing note and vitals reviewed.   CONSTITUTIONAL: Well developed/well nourished HEAD: Normocephalic/atraumatic EYES: EOMI/PERRL ENMT: Mucous membranes moist NECK: supple no meningeal signs SPINE/BACK:entire spine nontender, no cva pain, no tenderness of lumbar spine, straight leg raise elicits pain at 38-10 degrees CV: S1/S2 noted, no murmurs/rubs/gallops noted LUNGS: Lungs are clear to auscultation bilaterally, no apparent distress ABDOMEN: soft, nontender, no rebound or guarding, bowel sounds noted throughout abdomen GU:no cva  tenderness RECTAL: externl hemorhoids noted, normal sized protate with nodularity NEURO: Pt is awake/alert/appropriate, moves all extremitiesx4.  No facial droop.  reflexes 2+ and symmtrical , motor strength symmetrical EXTREMITIES: pulses normal/equal, full ROM SKIN: warm, color normal PSYCH: no abnormalities of mood noted, alert and oriented to situation  Results for orders placed or performed in visit on 09/14/14  POCT glucose (manual entry)  Result Value Ref Range   POC Glucose over 444 70 - 99 mg/dl  POCT urinalysis dipstick  Result Value Ref Range   Color, UA yellow    Clarity, UA clear    Glucose, UA 500    Bilirubin, UA neg    Ketones, UA neg    Spec Grav, UA <=1.005    Blood, UA trace    pH, UA 6.0    Protein, UA neg    Urobilinogen, UA 0.2    Nitrite, UA neg    Leukocytes, UA Negative   POCT UA - Microscopic Only  Result Value Ref Range   WBC, Ur, HPF, POC neg    RBC, urine, microscopic 0-1    Bacteria, U Microscopic trace    Mucus, UA neg    Epithelial cells, urine per micros 0-1    Crystals, Ur, HPF, POC neg    Casts, Ur, LPF, POC neg    Yeast, UA neg   POCT CBC  Result Value Ref Range   WBC 6.4 4.6 - 10.2 K/uL   Lymph, poc 1.9 0.6 - 3.4   POC LYMPH PERCENT 30.1 10 -  50 %L   MID (cbc) 0.2 0 - 0.9   POC MID % 3.2 0 - 12 %M   POC Granulocyte 4.3 2 - 6.9   Granulocyte percent 66.7 37 - 80 %G   RBC 4.86 4.69 - 6.13 M/uL   Hemoglobin 14.0 (A) 14.1 - 18.1 g/dL   HCT, POC 42.9 (A) 43.5 - 53.7 %   MCV 88.4 80 - 97 fL   MCH, POC 28.9 27 - 31.2 pg   MCHC 32.7 31.8 - 35.4 g/dL   RDW, POC 12.4 %   Platelet Count, POC 186 142 - 424 K/uL   MPV 7.8 0 - 99.8 fL  POCT glycosylated hemoglobin (Hb A1C)  Result Value Ref Range   Hemoglobin A1C 11.1    [facility] (PRIMARY) x-ray report read by Dr. Everlene Farrier: lumbar spine normal, multiple gallstones    Assessment & Plan:  Patient's glucose reading was too high for her machine. He is sent to the hospital for IV fluids and insulin  therapy. We can follow-up here once we have an accurate reading on his sugar and electrolytes.

## 2014-09-14 NOTE — ED Notes (Signed)
Pt was seen at urgent care today for leg and back pain. While at urgent care pt's CBG was too high to register. Pt has no hx of diabetes. Pt c/o urinary frequency. A&Ox4. Ambulatory to triage.

## 2014-09-14 NOTE — Discharge Instructions (Signed)
Drink 2 quarts of water, each day. Avoid all concentrated sweets, and carbohydrates. Use the resource guide to find a doctor to see for follow-up care in one or 2 weeks. Return here, if needed, for problems.    Hyperglycemia Hyperglycemia occurs when the glucose (sugar) in your blood is too high. Hyperglycemia can happen for many reasons, but it most often happens to people who do not know they have diabetes or are not managing their diabetes properly.  CAUSES  Whether you have diabetes or not, there are other causes of hyperglycemia. Hyperglycemia can occur when you have diabetes, but it can also occur in other situations that you might not be as aware of, such as: Diabetes  If you have diabetes and are having problems controlling your blood glucose, hyperglycemia could occur because of some of the following reasons:  Not following your meal plan.  Not taking your diabetes medications or not taking it properly.  Exercising less or doing less activity than you normally do.  Being sick. Pre-diabetes  This cannot be ignored. Before people develop Type 2 diabetes, they almost always have "pre-diabetes." This is when your blood glucose levels are higher than normal, but not yet high enough to be diagnosed as diabetes. Research has shown that some long-term damage to the body, especially the heart and circulatory system, may already be occurring during pre-diabetes. If you take action to manage your blood glucose when you have pre-diabetes, you may delay or prevent Type 2 diabetes from developing. Stress  If you have diabetes, you may be "diet" controlled or on oral medications or insulin to control your diabetes. However, you may find that your blood glucose is higher than usual in the hospital whether you have diabetes or not. This is often referred to as "stress hyperglycemia." Stress can elevate your blood glucose. This happens because of hormones put out by the body during times of  stress. If stress has been the cause of your high blood glucose, it can be followed regularly by your caregiver. That way he/she can make sure your hyperglycemia does not continue to get worse or progress to diabetes. Steroids  Steroids are medications that act on the infection fighting system (immune system) to block inflammation or infection. One side effect can be a rise in blood glucose. Most people can produce enough extra insulin to allow for this rise, but for those who cannot, steroids make blood glucose levels go even higher. It is not unusual for steroid treatments to "uncover" diabetes that is developing. It is not always possible to determine if the hyperglycemia will go away after the steroids are stopped. A special blood test called an A1c is sometimes done to determine if your blood glucose was elevated before the steroids were started. SYMPTOMS  Thirsty.  Frequent urination.  Dry mouth.  Blurred vision.  Tired or fatigue.  Weakness.  Sleepy.  Tingling in feet or leg. DIAGNOSIS  Diagnosis is made by monitoring blood glucose in one or all of the following ways:  A1c test. This is a chemical found in your blood.  Fingerstick blood glucose monitoring.  Laboratory results. TREATMENT  First, knowing the cause of the hyperglycemia is important before the hyperglycemia can be treated. Treatment may include, but is not be limited to:  Education.  Change or adjustment in medications.  Change or adjustment in meal plan.  Treatment for an illness, infection, etc.  More frequent blood glucose monitoring.  Change in exercise plan.  Decreasing or stopping steroids.  Lifestyle changes. HOME CARE INSTRUCTIONS   Test your blood glucose as directed.  Exercise regularly. Your caregiver will give you instructions about exercise. Pre-diabetes or diabetes which comes on with stress is helped by exercising.  Eat wholesome, balanced meals. Eat often and at regular, fixed  times. Your caregiver or nutritionist will give you a meal plan to guide your sugar intake.  Being at an ideal weight is important. If needed, losing as little as 10 to 15 pounds may help improve blood glucose levels. SEEK MEDICAL CARE IF:   You have questions about medicine, activity, or diet.  You continue to have symptoms (problems such as increased thirst, urination, or weight gain). SEEK IMMEDIATE MEDICAL CARE IF:   You are vomiting or have diarrhea.  Your breath smells fruity.  You are breathing faster or slower.  You are very sleepy or incoherent.  You have numbness, tingling, or pain in your feet or hands.  You have chest pain.  Your symptoms get worse even though you have been following your caregiver's orders.  If you have any other questions or concerns. Document Released: 01/04/2001 Document Revised: 10/03/2011 Document Reviewed: 11/07/2011 [facility] Patient Information 2015 [place], [place]. This information is not intended to replace advice given to you by your health care provider. Make sure you discuss any questions you have with your health care provider.   Basic Carbohydrate Counting for Diabetes Mellitus Carbohydrate counting is a method for keeping track of the amount of carbohydrates you eat. Eating carbohydrates naturally increases the level of sugar (glucose) in your blood, so it is important for you to know the amount that is okay for you to have in every meal. Carbohydrate counting helps keep the level of glucose in your blood within normal limits. The amount of carbohydrates allowed is different for every person. A dietitian can help you calculate the amount that is right for you. Once you know the amount of carbohydrates you can have, you can count the carbohydrates in the foods you want to eat. Carbohydrates are found in the following foods:  Grains, such as breads and cereals.  Dried beans and soy products.  Starchy vegetables, such as potatoes, peas,  and corn.  Fruit and fruit juices.  Milk and yogurt.  Sweets and snack foods, such as cake, cookies, candy, chips, soft drinks, and fruit drinks. CARBOHYDRATE COUNTING There are two ways to count the carbohydrates in your food. You can use either of the methods or a combination of both. Reading the "Nutrition Facts" on [place] The "Nutrition Facts" is an area that is included on the labels of almost all packaged food and beverages in the [place]. It includes the serving size of that food or beverage and information about the nutrients in each serving of the food, including the grams (g) of carbohydrate per serving.  Decide the number of servings of this food or beverage that you will be able to eat or drink. Multiply that number of servings by the number of grams of carbohydrate that is listed on the label for that serving. The total will be the amount of carbohydrates you will be having when you eat or drink this food or beverage. Learning Standard Serving Sizes of Food When you eat food that is not packaged or does not include "Nutrition Facts" on the label, you need to measure the servings in order to count the amount of carbohydrates.A serving of most carbohydrate-rich foods contains about 15 g of carbohydrates. The following list includes  serving sizes of carbohydrate-rich foods that provide 15 g ofcarbohydrate per serving:   1 slice of bread (1 oz) or 1 six-inch tortilla.    of a hamburger bun or English muffin.  4-6 crackers.   cup unsweetened dry cereal.    cup hot cereal.   cup rice or pasta.    cup mashed potatoes or  of a large baked potato.  1 cup fresh fruit or one small piece of fruit.    cup canned or frozen fruit or fruit juice.  1 cup milk.   cup plain fat-free yogurt or yogurt sweetened with artificial sweeteners.   cup cooked dried beans or starchy vegetable, such as peas, corn, or potatoes.  Decide the number of standard-size  servings that you will eat. Multiply that number of servings by 15 (the grams of carbohydrates in that serving). For example, if you eat 2 cups of strawberries, you will have eaten 2 servings and 30 g of carbohydrates (2 servings x 15 g = 30 g). For foods such as soups and casseroles, in which more than one food is mixed in, you will need to count the carbohydrates in each food that is included. EXAMPLE OF CARBOHYDRATE COUNTING Sample Dinner  3 oz chicken breast.   cup of brown rice.   cup of corn.  1 cup milk.   1 cup strawberries with sugar-free whipped topping.  Carbohydrate Calculation Step 1: Identify the foods that contain carbohydrates:   Rice.   Corn.   Milk.   Strawberries. Step 2:Calculate the number of servings eaten of each:   2 servings of rice.   1 serving of corn.   1 serving of milk.   1 serving of strawberries. Step 3: Multiply each of those number of servings by 15 g:   2 servings of rice x 15 g = 30 g.   1 serving of corn x 15 g = 15 g.   1 serving of milk x 15 g = 15 g.   1 serving of strawberries x 15 g = 15 g. Step 4: Add together all of the amounts to find the total grams of carbohydrates eaten: 30 g + 15 g + 15 g + 15 g = 75 g. Document Released: 07/11/2005 Document Revised: 11/25/2013 Document Reviewed: 06/07/2013 [facility] Patient Information 2015 [place], [place]. This information is not intended to replace advice given to you by your health care provider. Make sure you discuss any questions you have with your health care provider.    Emergency Department Resource Guide 1) Find a Doctor and Pay Out of Pocket Although you won't have to find out who is covered by your insurance plan, it is a good idea to ask around and get recommendations. You will then need to call the office and see if the doctor you have chosen will accept you as a new patient and what types of options they offer for patients who are self-pay. Some doctors  offer discounts or will set up payment plans for their patients who do not have insurance, but you will need to ask so you aren't surprised when you get to your appointment.  2) Contact Your Local Health Department Not all health departments have doctors that can see patients for sick visits, but many do, so it is worth a call to see if yours does. If you don't know where your local health department is, you can check in your phone book. The CDC also has a tool to help you  locate your state's health department, and many state websites also have listings of all of their local health departments.  3) Find a [facility] If your illness is not likely to be very severe or complicated, you may want to try a walk in clinic. These are popping up all over the country in pharmacies, drugstores, and shopping centers. They're usually staffed by nurse practitioners or physician assistants that have been trained to treat common illnesses and complaints. They're usually fairly quick and inexpensive. However, if you have serious medical issues or chronic medical problems, these are probably not your best option.  No Primary Care Doctor: - Call Health Connect at  [phone] - they can help you locate a primary care doctor that  accepts your insurance, provides certain services, etc. - Physician Referral Service- [phone]  Chronic Pain Problems: Organization         Address  Phone   Notes  [facility]  [phone] Patients need to be referred by their primary care doctor.   Medication Assistance: Organization         Address  Phone   Notes  [facility] [address] [place], [place] [address] [phone] --Must be a resident of [facility] -- Must have NO insurance coverage whatsoever (no Medicaid/ Medicare, etc.) -- The pt. MUST have a primary care doctor that directs their care regularly and follows them in the community    MedAssist  [phone]   [employer]  [phone]    Agencies that provide inexpensive medical care: Organization         Address  Phone   Notes  [place]  [phone]   [facility]    [phone]   [facility] [place], [place] [address] [phone]   [place] [address], [place] [phone]   Planned Parenthood    [phone]   [facility]    [phone]   [place] and [place] [address], [place] Phone:  [phone], Fax:  [phone] Hours of Operation:  9 am - 6 pm, M-F.  Also accepts Medicaid/Medicare and self-pay.  [facility] for [place] [address], [address], [place] Phone: [phone], Fax: [phone]. Hours of Operation:  8:30 am - 5:30 pm, M-F.  Also accepts Medicaid and self-pay.  [facility] [place] [address] [place] Phone: [phone]   [place], [place], [place] [phone], Ext. [phone] Mondays & Thursdays: 7-9 AM.  First 15 patients are seen on a first come, first serve basis.    [place] Providers:  Organization         Address  Phone   Notes  [facility] [address], [place] [phone] Also accepts self-pay patients.  [facility] [address], [place]  [phone]   [place], [address], [place] [phone]   [facility] [address], [place] [phone]   Lucianne Lei [address], [place]   [phone] Only accepts [place] Access [place] patients after they have their name applied to their card.   Self-Pay (no insurance) in [facility]:  Organization  Address  Phone   Notes  Sickle Cell Patients, [facility] Internal  Medicine [place] [phone]   [facility] [place] [phone]   [facility]   [place], [address],  [phone]   [facility]/Dr. Osei-Bonsu  [address] [place] or [address], [place] [phone] Phone number for both [place] and [place] locations is the same.  [facility] [address], [place] [phone]   [facility] [address], [place] or [address] [phone]   [facility] [address] [place] [phone], phone; [phone], fax Sees patients 1st and 3rd Saturday of every month.  Must not qualify for public or private insurance (i.e. Medicaid, Medicare, [place] Health Choice, Veterans' Benefits)  Household income should be no more than 200% of the poverty level The clinic cannot treat you if you are pregnant or think you are pregnant  Sexually transmitted diseases are not treated at the clinic.    Dental Care: Organization         Address  Phone  Notes  [facility] Department of [facility] [place] [phone] Accepts children up to age 52 who are enrolled in [place] or [place]; pregnant women with a Medicaid card; and children who have applied for Medicaid or [place] Health Choice, but were declined, whose parents can pay a reduced fee at time of service.  [facility] Department of [facility]  [address], [place] [phone] Accepts children up to age 19 who are enrolled in [place] or [place]; pregnant women with a Medicaid card; and children who have applied for Medicaid or [place] Health Choice, but were declined, whose parents can pay a reduced fee at time of service.  [place] Adult [facility] Access PRO[facility]  [place] [phone] Patients are seen by appointment only. Walk-ins are not accepted. [place] will see patients 39 years of age and older. Monday - Tuesday (8am-5pm) Most Wednesdays (8:30-5pm) $30 per visit, cash only  [facility] Access PRO[facility]  [address], [facility] [phone] Patients are seen by appointment only. Walk-ins are not accepted. [place] will see patients 62 years of age and older. One Wednesday Evening (Monthly: Volunteer Based).  $30 per visit, cash only  [place]  [phone] for adults; Children under age 89, call Graduate Pediatric Dentistry at [phone]. Children aged 8-14, please call [phone] to request a pediatric application.  Dental services are provided in all areas of dental care including fillings, crowns and bridges, complete and partial dentures, implants, gum treatment, root canals, and extractions. Preventive care is also provided. Treatment is provided to both adults and children. Patients are selected via a lottery and there is often a waiting list.   [facility] [address], [place]  [phone] [email or web address]   [facility] [address] [place], [place] [phone], Ext. [phone] Second and Fourth Thursday of each month, opens at 6:30 AM; Clinic ends at 9 AM.  Patients are seen on a first-come first-served basis, and a limited number are seen during each clinic.   [facility]  [address] [place], [place]  [place], [place] [phone]   Eligibility Requirements You must have lived in [place], [place], or [place] counties for at least the last three months.   You cannot be eligible for state or federal sponsored [employer], including [employer], [place], or [employer].   You generally cannot be eligible for healthcare insurance through your employer.    How to apply: Eligibility screenings are held every Tuesday and  Wednesday afternoon from 1:00 pm until 4:00 pm. You do not need an appointment for the interview!  [facility] [address] [place], [place]   [place]  [place] Department  [place]  [phone]    Behavioral Health Resources in the Community: Intensive Outpatient Programs Organization         Address  Phone  Notes  [place]. [address] [place], [place] [phone]   [facility] Outpatient [address] [place], [place]   ADS: Alcohol & Drug Svcs [address], [place], [place]   [place] [address],  [place], [place] or [phone]   Substance Abuse Resources Organization         Address  Phone  Notes  Alcohol and Drug Services  [phone]   [place]  [phone]   The [place]   Chinita Pester  [phone]   Residential & Outpatient Substance Abuse Program  [phone]   Psychological Services Organization         Address  Phone  Notes  [facility] [place]  [place]  [phone]   [place] [address], [place] or [phone]    Mobile Crisis Teams Organization         Address  Phone  Notes  [facility], Mobile Crisis Care Unit  [phone]   [facility]  [address] [place], [place]   [place] [address], [place] [phone]    Self-Help/Support Groups Organization         Address  Phone             Notes  [place]. of [place] - variety of support groups  [name] Call for more information  Narcotics Anonymous (NA), [facility] [address], [employer] [place]  2 meetings at this location   Financial planner         Address  Phone  Notes  [facility] [place],    [place]  [phone]   [facility]  [address] [place] [address], [place], [place]   [place], [place] [phone] Admissions: 8am-3pm M-F  Incentives Substance [place] [address],    [place], [place] [phone]   [facility] [address] Jadene Pierini [place], [place]   [facility] [address],  [place], [place]   [facility] Programs - Intensive Outpatient [address], [place], [place]   [facility] [place].,  [place], [place] or [phone]   [facility] (RTS) [address] [place], [place] Accepts Medicaid  [facility] [place] [address],  [place] [phone] Substance Abuse/Addiction Treatment   [facility] Resources Organization  Address  Phone  Notes  [facility]  [phone]   Domenic Schwab, PhD [address] [place]lis [place], [place]   [phone] or [phone]   [place], [place] [phone]   [address], [place], [place] [phone] Insurance/Medicaid/sponsorship through [facility] and Families [address], [address] [place]                                    [place], [place] [phone] [place] [address], [place] [phone]    Dr. Adele Schilder  [phone]   [facility] Dept. 1) [address] [place] 2) [place] 3)  [place] [address], [place] [phone]  [phone]   [place] [phone] or [phone] (After Hours)

## 2014-09-14 NOTE — Patient Instructions (Signed)
Please go to the hospital where they can do an accurate reading on your glucose and check urine electrolytes with consideration for IV fluids and IV insulin

## 2014-09-14 NOTE — ED Notes (Signed)
Nurse currently starting IV 

## 2014-09-15 ENCOUNTER — Telehealth: Payer: Self-pay

## 2014-09-15 LAB — PSA: PSA: 0.52 ng/mL (ref <=4.00)

## 2014-09-15 NOTE — Telephone Encounter (Signed)
Patient is returning call regarding his back pain.    [phone]

## 2014-09-16 ENCOUNTER — Telehealth: Payer: Self-pay

## 2014-09-16 ENCOUNTER — Encounter: Payer: Self-pay | Admitting: Family Medicine

## 2014-09-16 ENCOUNTER — Ambulatory Visit (INDEPENDENT_AMBULATORY_CARE_PROVIDER_SITE_OTHER): Payer: BC Managed Care – PPO | Admitting: Family Medicine

## 2014-09-16 VITALS — BP 130/80 | HR 78 | Temp 97.7°F | Resp 16 | Ht 69.0 in | Wt 211.0 lb

## 2014-09-16 DIAGNOSIS — M545 Low back pain, unspecified: Secondary | ICD-10-CM

## 2014-09-16 DIAGNOSIS — E1165 Type 2 diabetes mellitus with hyperglycemia: Secondary | ICD-10-CM

## 2014-09-16 DIAGNOSIS — Z23 Encounter for immunization: Secondary | ICD-10-CM

## 2014-09-16 DIAGNOSIS — E1129 Type 2 diabetes mellitus with other diabetic kidney complication: Secondary | ICD-10-CM

## 2014-09-16 DIAGNOSIS — B182 Chronic viral hepatitis C: Secondary | ICD-10-CM

## 2014-09-16 DIAGNOSIS — I10 Essential (primary) hypertension: Secondary | ICD-10-CM

## 2014-09-16 LAB — POCT URINALYSIS DIPSTICK
BILIRUBIN UA: NEGATIVE
Blood, UA: NEGATIVE
Glucose, UA: >=1000
KETONES UA: NEGATIVE
Leukocytes, UA: NEGATIVE
Nitrite, UA: NEGATIVE
Protein, UA: NEGATIVE
SPEC GRAV UA: 1.01
Urobilinogen, UA: 0.2
pH, UA: 6

## 2014-09-16 LAB — GLUCOSE, POCT (MANUAL RESULT ENTRY): POC Glucose: 405 mg/dl — AB (ref 70–99)

## 2014-09-16 LAB — COMPREHENSIVE METABOLIC PANEL
ALT: 55 U/L — AB (ref 0–53)
AST: 53 U/L — ABNORMAL HIGH (ref 0–37)
Albumin: 4.4 g/dL (ref 3.5–5.2)
Alkaline Phosphatase: 60 U/L (ref 39–117)
BILIRUBIN TOTAL: 0.5 mg/dL (ref 0.2–1.2)
BUN: 19 mg/dL (ref 6–23)
CALCIUM: 9.7 mg/dL (ref 8.4–10.5)
CHLORIDE: 93 meq/L — AB (ref 96–112)
CO2: 24 mEq/L (ref 19–32)
Creat: 1.11 mg/dL (ref 0.50–1.35)
Glucose, Bld: 356 mg/dL — ABNORMAL HIGH (ref 70–99)
Potassium: 4.7 mEq/L (ref 3.5–5.3)
Sodium: 128 mEq/L — ABNORMAL LOW (ref 135–145)
Total Protein: 7.6 g/dL (ref 6.0–8.3)

## 2014-09-16 LAB — POCT UA - MICROSCOPIC ONLY
Bacteria, U Microscopic: NEGATIVE
Casts, Ur, LPF, POC: NEGATIVE
Crystals, Ur, HPF, POC: NEGATIVE
Mucus, UA: NEGATIVE
WBC, UR, HPF, POC: NEGATIVE
YEAST UA: NEGATIVE

## 2014-09-16 LAB — TSH: TSH: 2.037 u[IU]/mL (ref 0.350–4.500)

## 2014-09-16 MED ORDER — CYCLOBENZAPRINE HCL 10 MG PO TABS: 10.0000 mg | ORAL_TABLET | Freq: Every day | ORAL | Status: DC

## 2014-09-16 MED ORDER — BLOOD GLUCOSE MONITOR KIT: PACK | Status: DC

## 2014-09-16 MED ORDER — LISINOPRIL-HYDROCHLOROTHIAZIDE 20-25 MG PO TABS: 1.0000 | ORAL_TABLET | Freq: Every day | ORAL | Status: DC

## 2014-09-16 MED ORDER — TRAMADOL HCL 50 MG PO TABS: 50.0000 mg | ORAL_TABLET | Freq: Three times a day (TID) | ORAL | Status: DC | PRN

## 2014-09-16 NOTE — Patient Instructions (Signed)
Diabetes Mellitus and Food It is important for you to manage your blood sugar (glucose) level. Your blood glucose level can be greatly affected by what you eat. Eating healthier foods in the appropriate amounts throughout the day at about the same time each day will help you control your blood glucose level. It can also help slow or prevent worsening of your diabetes mellitus. Healthy eating may even help you improve the level of your blood pressure and reach or maintain a healthy weight.  HOW CAN FOOD AFFECT ME? Carbohydrates Carbohydrates affect your blood glucose level more than any other type of food. Your dietitian will help you determine how many carbohydrates to eat at each meal and teach you how to count carbohydrates. Counting carbohydrates is important to keep your blood glucose at a healthy level, especially if you are using insulin or taking certain medicines for diabetes mellitus. Alcohol Alcohol can cause sudden decreases in blood glucose (hypoglycemia), especially if you use insulin or take certain medicines for diabetes mellitus. Hypoglycemia can be a life-threatening condition. Symptoms of hypoglycemia (sleepiness, dizziness, and disorientation) are similar to symptoms of having too much alcohol.  If your health care provider has given you approval to drink alcohol, do so in moderation and use the following guidelines:  Women should not have more than one drink per day, and men should not have more than two drinks per day. One drink is equal to:  12 oz of beer.  5 oz of wine.  1 oz of hard liquor.  Do not drink on an empty stomach.  Keep yourself hydrated. Have water, diet soda, or unsweetened iced tea.  Regular soda, juice, and other mixers might contain a lot of carbohydrates and should be counted. WHAT FOODS ARE NOT RECOMMENDED? As you make food choices, it is important to remember that all foods are not the same. Some foods have fewer nutrients per serving than other  foods, even though they might have the same number of calories or carbohydrates. It is difficult to get your body what it needs when you eat foods with fewer nutrients. Examples of foods that you should avoid that are high in calories and carbohydrates but low in nutrients include:  Trans fats (most processed foods list trans fats on the Nutrition Facts label).  Regular soda.  Juice.  Candy.  Sweets, such as cake, pie, doughnuts, and cookies.  Fried foods. WHAT FOODS CAN I EAT? Have nutrient-rich foods, which will nourish your body and keep you healthy. The food you should eat also will depend on several factors, including:  The calories you need.  The medicines you take.  Your weight.  Your blood glucose level.  Your blood pressure level.  Your cholesterol level. You also should eat a variety of foods, including:  Protein, such as meat, poultry, fish, tofu, nuts, and seeds (lean animal proteins are best).  Fruits.  Vegetables.  Dairy products, such as milk, cheese, and yogurt (low fat is best).  Breads, grains, pasta, cereal, rice, and beans.  Fats such as olive oil, trans fat-free margarine, canola oil, avocado, and olives. DOES EVERYONE WITH DIABETES MELLITUS HAVE THE SAME MEAL PLAN? Because every person with diabetes mellitus is different, there is not one meal plan that works for everyone. It is very important that you meet with a dietitian who will help you create a meal plan that is just right for you. Document Released: 04/07/2005 Document Revised: 07/16/2013 Document Reviewed: 06/07/2013 [employer] Patient Information 2015 [employer]. This   information is not intended to replace advice given to you by your health care provider. Make sure you discuss any questions you have with your health care provider.   Diabetes and Standards of Medical Care Diabetes is complicated. You may find that your diabetes team includes a dietitian, nurse, diabetes educator, eye  doctor, and more. To help everyone know what is going on and to help you get the care you deserve, the following schedule of care was developed to help keep you on track. Below are the tests, exams, vaccines, medicines, education, and plans you will need. HbA1c test This test shows how well you have controlled your glucose over the past 2-3 months. It is used to see if your diabetes management plan needs to be adjusted.   It is performed at least 2 times a year if you are meeting treatment goals.  It is performed 4 times a year if therapy has changed or if you are not meeting treatment goals. Blood pressure test  This test is performed at every routine medical visit. The goal is less than 140/90 mm Hg for most people, but 130/80 mm Hg in some cases. Ask your health care provider about your goal. Dental exam  Follow up with the dentist regularly. Eye exam  If you are diagnosed with type 1 diabetes as a child, get an exam upon reaching the age of 42 years or older and have had diabetes for 3-5 years. Yearly eye exams are recommended after that initial eye exam.  If you are diagnosed with type 1 diabetes as an adult, get an exam within 5 years of diagnosis and then yearly.  If you are diagnosed with type 2 diabetes, get an exam as soon as possible after the diagnosis and then yearly. Foot care exam  Visual foot exams are performed at every routine medical visit. The exams check for cuts, injuries, or other problems with the feet.  A comprehensive foot exam should be done yearly. This includes visual inspection as well as assessing foot pulses and testing for loss of sensation.  Check your feet nightly for cuts, injuries, or other problems with your feet. Tell your health care provider if anything is not healing. Kidney function test (urine microalbumin)  This test is performed once a year.  Type 1 diabetes: The first test is performed 5 years after diagnosis.  Type 2 diabetes: The first  test is performed at the time of diagnosis.  A serum creatinine and estimated glomerular filtration rate (eGFR) test is done once a year to assess the level of chronic kidney disease (CKD), if present. Lipid profile (cholesterol, HDL, LDL, triglycerides)  Performed every 5 years for most people.  The goal for LDL is less than 100 mg/dL. If you are at high risk, the goal is less than 70 mg/dL.  The goal for HDL is 40 mg/dL-50 mg/dL for men and 50 mg/dL-60 mg/dL for women. An HDL cholesterol of 60 mg/dL or higher gives some protection against heart disease.  The goal for triglycerides is less than 150 mg/dL. Influenza vaccine, pneumococcal vaccine, and hepatitis B vaccine  The influenza vaccine is recommended yearly.  It is recommended that people with diabetes who are over 99 years old get the pneumonia vaccine. In some cases, two separate shots may be given. Ask your health care provider if your pneumonia vaccination is up to date.  The hepatitis B vaccine is also recommended for adults with diabetes. Diabetes self-management education  Education is recommended at  diagnosis and ongoing as needed. Treatment plan  Your treatment plan is reviewed at every medical visit. Document Released: 05/08/2009 Document Revised: 11/25/2013 Document Reviewed: 12/11/2012 [facility] Patient Information 2015 [place], [place]. This information is not intended to replace advice given to you by your health care provider. Make sure you discuss any questions you have with your health care provider.   Diabetes and Exercise Diabetes mellitus is a common, chronic disease, in which the pancreas is unable to adequately control blood glucose (sugar) levels. There are 2 types of diabetes. Type 1 diabetes patients are unable to produce insulin, a hormone that causes sugar in the blood to be stored in the body. People with type 1 diabetes may compensate by giving themselves injections of insulin. Type 2 diabetes involves  not producing adequate amounts of insulin to control blood glucose levels. People with type 2 diabetes control their blood glucose by monitoring their food intake or by taking medicine. Exercise is an important part of diabetes treatment. During exercise, the muscles use a greater amount of glucose from the blood for energy. This lowers your blood glucose, which is the same effect you would get from taking insulin. It has been shown that endurance athletes are more sensitive to insulin than inactive people. SYMPTOMS  Many people with a mild case of diabetes have no symptoms. However, if left uncontrolled, diabetes can lead to several complications that could be prevented with treatment of the disease. General symptoms of diabetes include:   Frequent urination (polyuria).  Frequent thirst and drinking (polydipsia).  Increased food consumption (polyphagia).  Fatigue.  Poor exercise performance.  Blurred vision.  Inflammation of the vagina (vaginitis) caused by fungal infections.  Skin infections (uncommon).  Numbness in the feet, caused by nerve injury.  Kidney disease. CAUSES  The cause of most cases of diabetes is unknown. In children, diabetes is often due to an autoimmune response to the cells in the pancreas that make insulin. It is also linked with other diseases, such as cystic fibrosis. Diabetes may have a genetic link. PREVENTION  Athletes should strive to begin exercise with blood glucose in a well-controlled state.  Feet should always be kept clean and dry.  Activities in which low blood sugar levels cannot be treated easily (scuba diving, rock climbing, swimming) should be avoided.  Anticipate alterations in diet or training to avoid low blood sugar (hypoglycemia) and high blood sugar (hyperglycemia).  Athletes should try to increase sugar consumption after strenuous exercise to avoid hypoglycemia.  Short-acting insulin should not be injected into an actively  exercising muscle. The athlete should rest the injection site for about 1 hour after exercise.  Patients with diabetes should get routine checkups of the feet to prevent complications. PROGNOSIS  Exercise provides many benefits to the person with diabetes:   Reduced body fat.  Lower blood pressure.  Often, reduced need for medicines.  Improved exercise tolerance.  Lower insulin levels.  Weight loss.  Improved lipid profile (decreased cholesterol and low-density lipoproteins). RELATED COMPLICATIONS  If performed incorrectly, exercise can result in complications of diabetes:   Poor control of blood sugar, when exercise is performed at the wrong time.  Increase in renal disease, from loss of body fluids (dehydration).  Increased risk of nerve injury (neuropathy) when performing exercises that increase foot injury.  Increased risk of eye problems when performing activities that involve breath holding or lowering or jarring the head.  Increased risk of sudden death from exercise in patients with heart disease.  Worsening  of hypertension with heavy lifting (more than 10 lb/4.5 kg). Altered blood glucose and insulin dose as a result of mild illness that produces loss of appetite.  Altered uptake of insulin after injection when insulin injection site is changed. NOTE: Exercise can lower blood glucose effectively, but the effects are short-lasting (no more than a couple of days). Exercise has been shown to improve your sensitivity to insulin. This may alter how your body responds to a given dose of injected insulin. It is important for every patient with diabetes to know how his or her body may react to exercise, and to adjust insulin dosages accordingly. TREATMENT  Eat about 1 to 3 hours before exercise.  Check blood glucose immediately before and after exercise.  Stop exercise if blood glucose is more than 250 mg/dL.  Stop exercise if blood glucose is less than 100 mg/dL.  Do  not exercise within 1 hour of an insulin injection.  Be prepared to treat low blood glucose while exercising. Keep some sugar product with you, such as a candy bar.  For prolonged exercise, use a sports drink to maintain your glucose level.  Replace used-up glucose in the body after exercise.  Consume fluids during and after exercise to avoid dehydration. SEEK MEDICAL CARE IF:  You have vision changes after a run.  You notice a loss of sensation in your feet after exercise.  You have increased numbness, tingling, or pins and needles sensations after exercise.  You have chest pain during or after exercise.  You have a fast, irregular heartbeat (palpitations) during or after exercise.  Your exercise tolerance gets worse.  You have fainting or dizzy spells for brief periods during or after exercise. Document Released: 07/11/2005 Document Revised: 11/25/2013 Document Reviewed: 10/23/2008 [facility] Patient Information 2015 [place], [place]. This information is not intended to replace advice given to you by your health care provider. Make sure you discuss any questions you have with your health care provider.   Blood Glucose Monitoring Monitoring your blood glucose (also know as blood sugar) helps you to manage your diabetes. It also helps you and your health care provider monitor your diabetes and determine how well your treatment plan is working. WHY SHOULD YOU MONITOR YOUR BLOOD GLUCOSE?  It can help you understand how food, exercise, and medicine affect your blood glucose.  It allows you to know what your blood glucose is at any given moment. You can quickly tell if you are having low blood glucose (hypoglycemia) or high blood glucose (hyperglycemia).  It can help you and your health care provider know how to adjust your medicines.  It can help you understand how to manage an illness or adjust medicine for exercise. WHEN SHOULD YOU TEST? Your health care provider will help you  decide how often you should check your blood glucose. This may depend on the type of diabetes you have, your diabetes control, or the types of medicines you are taking. Be sure to write down all of your blood glucose readings so that this information can be reviewed with your health care provider. See below for examples of testing times that your health care provider may suggest. Type 1 Diabetes  Test 4 times a day if you are in good control, using an insulin pump, or perform multiple daily injections.  If your diabetes is not well controlled or if you are sick, you may need to monitor more often.  It is a good idea to also monitor:  Before and after exercise.  Between meals and 2 hours after a meal.  Occasionally between 2:00 a.m. and 3:00 a.m. Type 2 Diabetes  It can vary with each person, but generally, if you are on insulin, test 4 times a day.  If you take medicines by mouth (orally), test 2 times a day.  If you are on a controlled diet, test once a day.  If your diabetes is not well controlled or if you are sick, you may need to monitor more often. HOW TO MONITOR YOUR BLOOD GLUCOSE Supplies Needed  Blood glucose meter.  Test strips for your meter. Each meter has its own strips. You must use the strips that go with your own meter.  A pricking needle (lancet).  A device that holds the lancet (lancing device).  A journal or log book to write down your results. Procedure  Wash your hands with soap and water. Alcohol is not preferred.  Prick the side of your finger (not the tip) with the lancet.  Gently milk the finger until a small drop of blood appears.  Follow the instructions that come with your meter for inserting the test strip, applying blood to the strip, and using your blood glucose meter. Other Areas to Get Blood for Testing Some meters allow you to use other areas of your body (other than your finger) to test your blood. These areas are called alternative  sites. The most common alternative sites are:  The forearm.  The thigh.  The back area of the lower leg.  The palm of the hand. The blood flow in these areas is slower. Therefore, the blood glucose values you get may be delayed, and the numbers are different from what you would get from your fingers. Do not use alternative sites if you think you are having hypoglycemia. Your reading will not be accurate. Always use a finger if you are having hypoglycemia. Also, if you cannot feel your lows (hypoglycemia unawareness), always use your fingers for your blood glucose checks. ADDITIONAL TIPS FOR GLUCOSE MONITORING  Do not reuse lancets.  Always carry your supplies with you.  All blood glucose meters have a 24-hour "hotline" number to call if you have questions or need help.  Adjust (calibrate) your blood glucose meter with a control solution after finishing a few boxes of strips. BLOOD GLUCOSE RECORD KEEPING It is a good idea to keep a daily record or log of your blood glucose readings. Most glucose meters, if not all, keep your glucose records stored in the meter. Some meters come with the ability to download your records to your home computer. Keeping a record of your blood glucose readings is especially helpful if you are wanting to look for patterns. Make notes to go along with the blood glucose readings because you might forget what happened at that exact time. Keeping good records helps you and your health care provider to work together to achieve good diabetes management.  Document Released: 07/14/2003 Document Revised: 11/25/2013 Document Reviewed: 12/03/2012 [facility] Patient Information 2015 [place], [place]. This information is not intended to replace advice given to you by your health care provider. Make sure you discuss any questions you have with your health care provider.   Sciatica Sciatica is pain, weakness, numbness, or tingling along the path of the sciatic nerve. The nerve  starts in the lower back and runs down the back of each leg. The nerve controls the muscles in the lower leg and in the back of the knee, while also providing sensation to  the back of the thigh, lower leg, and the sole of your foot. Sciatica is a symptom of another medical condition. For instance, nerve damage or certain conditions, such as a herniated disk or bone spur on the spine, pinch or put pressure on the sciatic nerve. This causes the pain, weakness, or other sensations normally associated with sciatica. Generally, sciatica only affects one side of the body. CAUSES   Herniated or slipped disc.  Degenerative disk disease.  A pain disorder involving the narrow muscle in the buttocks (piriformis syndrome).  Pelvic injury or fracture.  Pregnancy.  Tumor (rare). SYMPTOMS  Symptoms can vary from mild to very severe. The symptoms usually travel from the low back to the buttocks and down the back of the leg. Symptoms can include:  Mild tingling or dull aches in the lower back, leg, or hip.  Numbness in the back of the calf or sole of the foot.  Burning sensations in the lower back, leg, or hip.  Sharp pains in the lower back, leg, or hip.  Leg weakness.  Severe back pain inhibiting movement. These symptoms may get worse with coughing, sneezing, laughing, or prolonged sitting or standing. Also, being overweight may worsen symptoms. DIAGNOSIS  Your caregiver will perform a physical exam to look for common symptoms of sciatica. He or she may ask you to do certain movements or activities that would trigger sciatic nerve pain. Other tests may be performed to find the cause of the sciatica. These may include:  Blood tests.  X-rays.  Imaging tests, such as an MRI or CT scan. TREATMENT  Treatment is directed at the cause of the sciatic pain. Sometimes, treatment is not necessary and the pain and discomfort goes away on its own. If treatment is needed, your caregiver may  suggest:  Over-the-counter medicines to relieve pain.  Prescription medicines, such as anti-inflammatory medicine, muscle relaxants, or narcotics.  Applying heat or ice to the painful area.  Steroid injections to lessen pain, irritation, and inflammation around the nerve.  Reducing activity during periods of pain.  Exercising and stretching to strengthen your abdomen and improve flexibility of your spine. Your caregiver may suggest losing weight if the extra weight makes the back pain worse.  Physical therapy.  Surgery to eliminate what is pressing or pinching the nerve, such as a bone spur or part of a herniated disk. HOME CARE INSTRUCTIONS   Only take over-the-counter or prescription medicines for pain or discomfort as directed by your caregiver.  Apply ice to the affected area for 20 minutes, 3-4 times a day for the first 48-72 hours. Then try heat in the same way.  Exercise, stretch, or perform your usual activities if these do not aggravate your pain.  Attend physical therapy sessions as directed by your caregiver.  Keep all follow-up appointments as directed by your caregiver.  Do not wear high heels or shoes that do not provide proper support.  Check your mattress to see if it is too soft. A firm mattress may lessen your pain and discomfort. SEEK IMMEDIATE MEDICAL CARE IF:   You lose control of your bowel or bladder (incontinence).  You have increasing weakness in the lower back, pelvis, buttocks, or legs.  You have redness or swelling of your back.  You have a burning sensation when you urinate.  You have pain that gets worse when you lie down or awakens you at night.  Your pain is worse than you have experienced in the past.  Your pain  is lasting longer than 4 weeks.  You are suddenly losing weight without reason. MAKE SURE YOU:  Understand these instructions.  Will watch your condition.  Will get help right away if you are not doing well or get  worse. Document Released: 07/05/2001 Document Revised: 01/10/2012 Document Reviewed: 11/20/2011 [facility] Patient Information 2015 [place], [place]. This information is not intended to replace advice given to you by your health care provider. Make sure you discuss any questions you have with your health care provider.

## 2014-09-16 NOTE — Telephone Encounter (Signed)
Patient would like a call back for education on glucose monitoring, normal and abnormal values, etc.  He said today was his first time checking his blood sugar on his own, and he wants to know more information about what is a good/bad level.  Please advise.  Thank you.  CB#: [phone]

## 2014-09-16 NOTE — Progress Notes (Addendum)
This chart was scribed for [name], MD by Lowella Petties, ED Scribe. The patient was seen in room . Patient's care was started at 11:31 AM.  Subjective:   Patient ID: Alejandro [name], male    DOB: 07-14-1961, 54 y.o.   MRN: [id number]  Chief Complaint  Patient presents with  . Back Pain    Lower/ X 2 weeks  . Polyuria    HPI  HPI Comments: Alejandro [name] is a 54 y.o. male with a history of Hepatitis C and HTN who presents to the [facility] for a follow up of his newly diagnosed DM 2 days ago. He was seen for this by Dr. Everlene Farrier two days ago, and he was noting polyuria and polydipsia at that time as well. A blood glucose was drawn which was over 444 with a hemoglobin A1C of 11.1. He was diagnosed with type 2 DM. He was transferred to the ER for acute hyperglycemia. He was given several liters of IV fluid, had severe hyponitremia, his glucose was 713, he had some signs of acute renal failure and liver failure attributed to mild dehydration and he was sent home. He was placed on placed on Metformin 500 MG BID. He has a history of hepatitis C. 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   Today he states that he normally sees Dr. Elder Cyphers. He reports persistent lower back pain, and he states that this has become worse now that he cannot take OTC medications for pain with his DM. He reports that his parents had a history of DM. He states that he continued eating regularly, drinking a lot of water, and going to the bathroom very frequently. He is agreeable to diabetic education and using a glucometer. He reports taking HCTZ. He reports eating last at about 8:00 AM. He states that at one time he took a baby Asprin everyday, and he is agreeable to doing this again. He states that he has an eye doctor that he hasn't seen in a while, but he is agreeable to doing so. He denies foot pain, numbness, and tingling. He states he has been off of  work for the past 2 days. He denies nausea, vomiting, and diarrhea thus far. He has not had a flu shot this year. He Tetanus is not UTD and he has not had the pneumovax.    Past Medical History  Diagnosis Date  . Hepatitis C   . Hypertension    Current Outpatient Prescriptions on File Prior to Visit  Medication Sig Dispense Refill  . lisinopril-hydrochlorothiazide (PRINZIDE,ZESTORETIC) 20-25 MG per tablet Take 1 tablet by mouth daily. PATIENT NEEDS OFFICE VISIT FOR ADDITIONAL REFILLS (Patient taking differently: Take 1 tablet by mouth daily with breakfast. ) 30 tablet 0  . metFORMIN (GLUCOPHAGE) 500 MG tablet Take 1 tablet (500 mg total) by mouth 2 (two) times daily with a meal. 60 tablet 3  . [DISCONTINUED] atorvastatin (LIPITOR) 20 MG tablet Take 1 tablet (20 mg total) by mouth daily. (Patient not taking: Reported on 09/14/2014) 90 tablet 3  . [DISCONTINUED] lansoprazole (PREVACID) 30 MG capsule Take 1 capsule (30 mg total) by mouth daily at 12 noon. (Patient not taking: Reported on 09/14/2014) 30 capsule 5   No current facility-administered medications on file prior to visit.   Allergies  Allergen Reactions  . Tylenol [Acetaminophen] Hypertension   Review of Systems  Constitutional: Negative for chills.  HENT: Negative  for congestion, rhinorrhea and sore throat.   Respiratory: Negative for cough and shortness of breath.   Gastrointestinal: Negative for nausea, vomiting, diarrhea and constipation.  Genitourinary: Positive for frequency.    BP 130/80 mmHg  [name] 78  Temp(Src) 97.7 F (36.5 C) (Oral)  Resp 16  Ht _0  (1.753 m)  Wt 211 lb (95.709 kg)  BMI 31.15 kg/m2  SpO2 97%   Objective:  Physical Exam  Constitutional: He is oriented to person, place, and time. He appears well-developed and well-nourished. No distress.  HENT:  Head: Normocephalic and atraumatic.  Mouth/Throat: Oropharynx is clear and moist.  Eyes: Conjunctivae and EOM are normal. Pupils are equal, round,  and reactive to light.  Neck: Neck supple. No tracheal deviation present.  Cardiovascular: Normal rate, regular rhythm, S1 normal, S2 normal, normal heart sounds and intact distal pulses.   No murmur heard. Pulmonary/Chest: Effort normal and breath sounds normal. He has no wheezes. He has no rales. He exhibits no tenderness.  Musculoskeletal: Normal range of motion.  2+ patellar and achilles reflex bilaterally. No tenderness to palpation over lumbar spinous process or Paraspinous process muscles. Negative SLR bilaterally. 5/5 lower extremity strength bilaterally.   Lymphadenopathy:    He has no cervical adenopathy.  Neurological: He is alert and oriented to person, place, and time.  Skin: Skin is warm and dry.  Psychiatric: He has a normal mood and affect. His behavior is normal.  Nursing note and vitals reviewed.  Results for orders placed or performed in visit on 09/16/14  Comprehensive metabolic panel  Result Value Ref Range   Sodium 128 (L) 135 - 145 mEq/L   Potassium 4.7 3.5 - 5.3 mEq/L   Chloride 93 (L) 96 - 112 mEq/L   CO2 24 19 - 32 mEq/L   Glucose, Bld 356 (H) 70 - 99 mg/dL   BUN 19 6 - 23 mg/dL   Creat 1.11 0.50 - 1.35 mg/dL   Total Bilirubin 0.5 0.2 - 1.2 mg/dL   Alkaline Phosphatase 60 39 - 117 U/L   AST 53 (H) 0 - 37 U/L   ALT 55 (H) 0 - 53 U/L   Total Protein 7.6 6.0 - 8.3 g/dL   Albumin 4.4 3.5 - 5.2 g/dL   Calcium 9.7 8.4 - 10.5 mg/dL  TSH  Result Value Ref Range   TSH 2.037 0.350 - 4.500 uIU/mL  POCT glucose (manual entry)  Result Value Ref Range   POC Glucose 405 (A) 70 - 99 mg/dl  POCT urinalysis dipstick  Result Value Ref Range   Color, UA yellow    Clarity, UA clear    Glucose, UA >=1000    Bilirubin, UA neg    Ketones, UA neg    Spec Grav, UA 1.010    Blood, UA neg    pH, UA 6.0    Protein, UA neg    Urobilinogen, UA 0.2    Nitrite, UA neg    Leukocytes, UA Negative   POCT UA - Microscopic Only  Result Value Ref Range   WBC, Ur, HPF, POC neg     RBC, urine, microscopic 0-1    Bacteria, U Microscopic neg    Mucus, UA neg    Epithelial cells, urine per micros 0-1    Crystals, Ur, HPF, POC neg    Casts, Ur, LPF, POC neg    Yeast, UA neg     Assessment & Plan:   Type 2 diabetes mellitus with hyperglycemia - Plan: Microalbumin, urine,  POCT glucose (manual entry), POCT urinalysis dipstick, POCT UA - Microscopic Only - new diagnosis 2d prior when cbg was >700 - was transferred to ER - found to be hyponatremic at 125, K 4.9 w/ mild ARR w/ Cr 1.4 - rehydrated with IVF and d/c'ed from Er on metformin 500 bid.  Starting to get diarrhea from metformin but if tolerates cons increased to 1039m bid in 1-2 wks and recheck in OV in 1 mo.  Start asa 81 qd, cont acei. TDaP and pneumovax in OV today. Pt refuses flu vaccine.  Reviewed diet and how to check cbgs - start bid - fasting and 2 hr PP - vary the meal it is checked after - record and bring log to f/u OV. Pt reminded to f/u w/ optho and have note sent here. Rec sched appt for CPEx in next sev mos. Needs flp at next OV.  Type 2 diabetes mellitus with other diabetic kidney complication - Plan: TSH  Chronic hepatitis C without hepatic coma - Plan: TSH, Microalbumin, urine - vaccinated against Hep A and B prev - pt would like to start treatment so refer to hep C clinic.  Essential hypertension, benign - Plan: Comprehensive metabolic panel, TSH - refilled lisinopril-hctz  Left-sided low back pain without sciatica - Plan: Comprehensive metabolic panel, Ambulatory referral to Physical Therapy - monofil decreased slightly on Lt but suspect due to radiculopathy rather than neuropathy - fortunately pt does not have any other radicular sxs at this time - glucocorticoids contraind due to acute hyperglycemia, nsaids due to ARF, and acetaminophen due to hep C/transaminitis - try flexeril qhs, cont heat and gentle stretching. Refer to PT.  Prn tramadol.  Recheck 6-8 wks.  Meds ordered this encounter    Medications  . blood glucose meter kit and supplies KIT    Sig: Dispense based on patient and insurance preference. Use up to four times daily as directed. (FOR ICD-9 250.00, 250.01).    Dispense:  1 each    Refill:  0    Please go over how to use glucometer and lancets. Patient is newly diagnosed.    Order Specific Question:  Number of strips    Answer:  100    Order Specific Question:  Number of lancets    Answer:  11  . cyclobenzaprine (FLEXERIL) 10 MG tablet    Sig: Take 1 tablet (10 mg total) by mouth at bedtime. And during the day as needed up to every 8 hours for muscle spasm    Dispense:  30 tablet    Refill:  0  . lisinopril-hydrochlorothiazide (PRINZIDE,ZESTORETIC) 20-25 MG per tablet    Sig: Take 1 tablet by mouth daily.    Dispense:  90 tablet    Refill:  1  . traMADol (ULTRAM) 50 MG tablet    Sig: Take 1 tablet (50 mg total) by mouth every 8 (eight) hours as needed.    Dispense:  30 tablet    Refill:  1   Over 40 min spent in face-to-face evaluation of and consultation with patient and coordination of care.  Over 50% of this time was spent counseling this patient.   I personally performed the services described in this documentation, which was scribed in my presence. The recorded information has been reviewed and considered, and addended by me as needed.  EDelman Cheadle MD MPH

## 2014-09-17 LAB — MICROALBUMIN, URINE: Microalb, Ur: 0.3 mg/dL (ref <2.0)

## 2014-09-19 NOTE — Telephone Encounter (Signed)
Left message for pt to call back  °

## 2014-09-22 NOTE — Telephone Encounter (Signed)
Left message with wife to have pt contact us back regarding his questions.  Considering his DM resulted over 700

## 2014-09-22 NOTE — Telephone Encounter (Signed)
To follow to my previous message, because his DM are coming down he should continue to monitor his DM as recommended per Brigitte Pulse. An average range is 60-150, but his will need to come down slowly and healthy.  If his sugar starts dropping too fast, this should be addressed.

## 2014-09-23 NOTE — Telephone Encounter (Signed)
Spoke with pt, his BS have  170, 123, 107, 236. I advised him that these numbers are definitely better than his initial blood sugar (in the 700's). I advised him to keep a diary of his blood sugars and bring them to his next appt. Pt understood and agreed.

## 2014-09-27 ENCOUNTER — Encounter: Payer: Self-pay | Admitting: Dietician

## 2014-09-27 ENCOUNTER — Encounter: Payer: BC Managed Care – PPO | Attending: Family Medicine | Admitting: Dietician

## 2014-09-27 VITALS — Ht 70.0 in | Wt 216.2 lb

## 2014-09-27 DIAGNOSIS — E1129 Type 2 diabetes mellitus with other diabetic kidney complication: Secondary | ICD-10-CM | POA: Diagnosis not present

## 2014-09-27 DIAGNOSIS — Z713 Dietary counseling and surveillance: Secondary | ICD-10-CM | POA: Diagnosis not present

## 2014-09-27 NOTE — Progress Notes (Signed)
Diabetes Self-Management Education  Visit Type:    Appt. Start Time: 1115 Appt. End Time: 1215  09/27/2014  [name], identified by name and date of birth, is a 54 y.o. male with a diagnosis of Diabetes: Type 2.  Other people present during visit:  Patient   ASSESSMENT  Height 5\' 10"  (1.778 m), weight 216 lb 3.2 oz (98.068 kg). Body mass index is 31.02 kg/(m^2).  Initial Visit Information:  Are you currently following a meal plan?: Yes What type of meal plan do you follow?: staying away from starches and sugars Are you taking your medications as prescribed?: Yes Are you checking your feet?: Yes How many days per week are you checking your feet?: 7 How often do you need to have someone help you when you read instructions, pamphlets, or other written materials from your doctor or pharmacy?: 1 - Never What is the last grade level you completed in school?: 12th grand  Psychosocial:     Patient Belief/Attitude about Diabetes: Motivated to manage diabetes Self-management support: Doctor's office, Friends, Family Other persons present: Patient Patient Concerns: Weight Control, Nutrition/Meal planning Special Needs: None Preferred Learning Style: No preference indicated Learning Readiness: Ready  Complications:   Last HgB A1C per patient/outside source: 11.1 mg/dL (08/2014) How often do you check your blood sugar?: 3-4 times/day Fasting Blood glucose range (mg/dL): 70-129, 130-179, 180-200 Postprandial Blood glucose range (mg/dL): 130-179, 70-129, 180-200 Number of hypoglycemic episodes per month: 0 Number of hyperglycemic episodes per week: 0 Have you had a dilated eye exam in the past 12 months?: Yes Have you had a dental exam in the past 12 months?: Yes  Diet Intake:  Breakfast: bacon, 2 eggs, grits  Snack (morning): cashews or almond or fruit Lunch: salad with dressing sometimes with chicken Snack (afternoon): cashews or almond or fruit Dinner: soup or  salad Snack (evening): cashews or almond or fruit Beverage(s): water, diluted iced tea, small amount of soda  Exercise:  Exercise: ADL's, Light (walking / raking leaves) Light Exercise amount of time (min / week): 150  Individualized Plan for Diabetes Self-Management Training:   Learning Objective:  Patient will have a greater understanding of diabetes self-management.  Patient education plan per assessed needs and concerns is to attend individual sessions for     Education Topics Reviewed with Patient Today:  Definition of diabetes, type 1 and 2, and the diagnosis of diabetes Food label reading, portion sizes and measuring food., Carbohydrate counting, Role of diet in the treatment of diabetes and the relationship between the three main macronutrients and blood glucose level, Reviewed blood glucose goals for pre and post meals and how to evaluate the patients' food intake on their blood glucose level., Effects of alcohol on blood glucose and safety factors with consumption of alcohol. Role of exercise on diabetes management, blood pressure control and cardiac health. Reviewed patients medication for diabetes, action, purpose, timing of dose and side effects. Taught/discussed recording of test results and interpretation of SMBG., Daily foot exams, Yearly dilated eye exam, Interpreting lab values - A1C, lipid, urine microalbumina., Purpose and frequency of SMBG. Taught treatment of hypoglycemia - the 15 rule., Discussed and identified patients' treatment of hyperglycemia. Relationship between chronic complications and blood glucose control, Assessed and discussed foot care and prevention of foot problems, Dental care, Retinopathy and reason for yearly dilated eye exams Role of stress on diabetes      PATIENTS GOALS/Plan (Developed by the patient):  Nutrition: Follow meal plan discussed Physical Activity: 30  minutes per day, Exercise 5-7 days per week Medications: take my medication as  prescribed Monitoring : test my blood glucose as discussed (note x per day with comment) (3-4 x day) Reducing Risk: examine blood glucose patterns, do foot checks daily, treat hypoglycemia with 15 grams of carbs if blood glucose less than 70mg /dL  Plan:   Patient Instructions  Goals:  Follow Diabetes Meal Plan as instructed  Eat 3 meals and 2 snacks, every 3-5 hrs  Limit carbohydrate intake to 45-60 grams carbohydrate/meal  Limit carbohydrate intake to 15 grams carbohydrate/snack  Continue phasing out soda, try coffee with spenda  Add lean protein foods to meals/snacks  Monitor glucose levels as instructed by your doctor  Aim for 30 mins of physical activity daily  Bring food record and glucose log to your next nutrition visit   Expected Outcomes:  Demonstrated interest in learning. Expect positive outcomes  Education material provided: Living Well with Diabetes, A1C conversion sheet, Meal plan card and Snack sheet  If problems or questions, patient to contact team via:  Phone  Future DSME appointment: PRN

## 2014-09-27 NOTE — Patient Instructions (Signed)
Goals:  Follow Diabetes Meal Plan as instructed  Eat 3 meals and 2 snacks, every 3-5 hrs  Limit carbohydrate intake to 45-60 grams carbohydrate/meal  Limit carbohydrate intake to 15 grams carbohydrate/snack  Continue phasing out soda, try coffee with spenda  Add lean protein foods to meals/snacks  Monitor glucose levels as instructed by your doctor  Aim for 30 mins of physical activity daily  Bring food record and glucose log to your next nutrition visit

## 2014-10-03 ENCOUNTER — Other Ambulatory Visit: Payer: Self-pay | Admitting: Family Medicine

## 2014-10-08 ENCOUNTER — Telehealth: Payer: Self-pay

## 2014-10-08 NOTE — Telephone Encounter (Signed)
Pt states he need some scripts for his diabetic meds. Please call [phone] Also would like to know if he could get his sugar twice a day instead     [name]

## 2014-10-09 MED ORDER — GLUCOSE BLOOD VI STRP: ORAL_STRIP | Status: DC

## 2014-10-09 NOTE — Telephone Encounter (Signed)
Spoke with pt, he needs another Rx sent to his pharmacy with the directions being twice daily.

## 2014-10-14 ENCOUNTER — Other Ambulatory Visit: Payer: Self-pay

## 2014-10-14 MED ORDER — GLUCOSE BLOOD VI STRP: ORAL_STRIP | Status: DC

## 2014-10-15 ENCOUNTER — Telehealth: Payer: Self-pay

## 2014-10-15 NOTE — Telephone Encounter (Signed)
Patient is calling in regards to the medication that was recently sent to [facility]. Patient states he got a call from them stating that they never got approval for the medication. Patient was informed that the medication was approved in [place]. Please call patient! [phone]

## 2014-10-16 NOTE — Telephone Encounter (Signed)
I had resent a Rx yesterday to [facility] in response to a refill req from them. Pt stated that he did get word that they received it and the [facility] should hold him until mail order gets to him. D/W pt BS readings and when and how to keep records for his f/up visit in April. Pt agreed to [facility] w/ any further ?s, or if his BS continues to be above 200 after eating.

## 2014-10-31 ENCOUNTER — Ambulatory Visit: Payer: BC Managed Care – PPO | Admitting: Family Medicine

## 2014-11-21 ENCOUNTER — Ambulatory Visit (INDEPENDENT_AMBULATORY_CARE_PROVIDER_SITE_OTHER): Payer: BC Managed Care – PPO | Admitting: Family Medicine

## 2014-11-21 ENCOUNTER — Encounter: Payer: Self-pay | Admitting: Family Medicine

## 2014-11-21 ENCOUNTER — Other Ambulatory Visit: Payer: Self-pay | Admitting: Family Medicine

## 2014-11-21 VITALS — BP 118/72 | HR 82 | Temp 98.8°F | Resp 16 | Ht 69.5 in | Wt 215.0 lb

## 2014-11-21 DIAGNOSIS — B070 Plantar wart: Secondary | ICD-10-CM | POA: Diagnosis not present

## 2014-11-21 DIAGNOSIS — L84 Corns and callosities: Secondary | ICD-10-CM

## 2014-11-21 DIAGNOSIS — B182 Chronic viral hepatitis C: Secondary | ICD-10-CM

## 2014-11-21 DIAGNOSIS — B351 Tinea unguium: Secondary | ICD-10-CM | POA: Diagnosis not present

## 2014-11-21 DIAGNOSIS — E1129 Type 2 diabetes mellitus with other diabetic kidney complication: Secondary | ICD-10-CM

## 2014-11-21 MED ORDER — TRAMADOL HCL 50 MG PO TABS: 50.0000 mg | ORAL_TABLET | Freq: Three times a day (TID) | ORAL | Status: DC | PRN

## 2014-11-21 MED ORDER — CYCLOBENZAPRINE HCL 10 MG PO TABS: 10.0000 mg | ORAL_TABLET | Freq: Three times a day (TID) | ORAL | Status: DC | PRN

## 2014-11-21 MED ORDER — METFORMIN HCL 500 MG PO TABS: 500.0000 mg | ORAL_TABLET | Freq: Two times a day (BID) | ORAL | Status: DC

## 2014-11-21 NOTE — Patient Instructions (Signed)
Diabetes and Foot Care Diabetes may cause you to have problems because of poor blood supply (circulation) to your feet and legs. This may cause the skin on your feet to become thinner, break easier, and heal more slowly. Your skin may become dry, and the skin may peel and crack. You may also have nerve damage in your legs and feet causing decreased feeling in them. You may not notice minor injuries to your feet that could lead to infections or more serious problems. Taking care of your feet is one of the most important things you can do for yourself.  HOME CARE INSTRUCTIONS  Wear shoes at all times, even in the house. Do not go barefoot. Bare feet are easily injured.  Check your feet daily for blisters, cuts, and redness. If you cannot see the bottom of your feet, use a mirror or ask someone for help.  Wash your feet with warm water (do not use hot water) and mild soap. Then pat your feet and the areas between your toes until they are completely dry. Do not soak your feet as this can dry your skin.  Apply a moisturizing lotion or petroleum jelly (that does not contain alcohol and is unscented) to the skin on your feet and to dry, brittle toenails. Do not apply lotion between your toes.  Trim your toenails straight across. Do not dig under them or around the cuticle. File the edges of your nails with an emery board or nail file.  Do not cut corns or calluses or try to remove them with medicine.  Wear clean socks or stockings every day. Make sure they are not too tight. Do not wear knee-high stockings since they may decrease blood flow to your legs.  Wear shoes that fit properly and have enough cushioning. To break in new shoes, wear them for just a few hours a day. This prevents you from injuring your feet. Always look in your shoes before you put them on to be sure there are no objects inside.  Do not cross your legs. This may decrease the blood flow to your feet.  If you find a minor scrape,  cut, or break in the skin on your feet, keep it and the skin around it clean and dry. These areas may be cleansed with mild soap and water. Do not cleanse the area with peroxide, alcohol, or iodine.  When you remove an adhesive bandage, be sure not to damage the skin around it.  If you have a wound, look at it several times a day to make sure it is healing.  Do not use heating pads or hot water bottles. They may burn your skin. If you have lost feeling in your feet or legs, you may not know it is happening until it is too late.  Make sure your health care provider performs a complete foot exam at least annually or more often if you have foot problems. Report any cuts, sores, or bruises to your health care provider immediately. SEEK MEDICAL CARE IF:   You have an injury that is not healing.  You have cuts or breaks in the skin.  You have an ingrown nail.  You notice redness on your legs or feet.  You feel burning or tingling in your legs or feet.  You have pain or cramps in your legs and feet.  Your legs or feet are numb.  Your feet always feel cold. SEEK IMMEDIATE MEDICAL CARE IF:   There is increasing redness,   swelling, or pain in or around a wound.  There is a red line that goes up your leg.  Pus is coming from a wound.  You develop a fever or as directed by your health care provider.  You notice a bad smell coming from an ulcer or wound. Document Released: 07/08/2000 Document Revised: 03/13/2013 Document Reviewed: 12/18/2012 [facility] Patient Information 2015 [place], [place]. This information is not intended to replace advice given to you by your health care provider. Make sure you discuss any questions you have with your health care provider. Complementary and Alternative Medical Therapies for Diabetes Complementary and alternative medicines are health care practices or products that are not always accepted as part of routine medicine. Complementary medicine is used along  with routine medicine (medical therapy). Alternative medicine can sometimes be used instead of routine medicine. Some people use these methods to treat diabetes. While some of these therapies may be effective, others may not be. Some may even be harmful. Patients using these methods need to tell their caregiver. It is important to let your caregivers know what you are doing. Some of these therapies are discussed below. For more information, talk with your caregiver. THERAPIES Acupuncture Acupuncture is done by a professional who inserts needles into certain points on the skin. Some scientists believe that this triggers the release of the body's natural painkillers. It has been shown to relieve long-term (chronic) pain. This may help patients with painful nerve damage caused by diabetes. Biofeedback Biofeedback helps a person become more aware of the body's response to pain. It also helps you learn to deal with the pain. This alternative therapy focuses on relaxation and stress-reduction techniques. Thinking of peaceful mental images (guided imagery) is one technique. Some people believe these images can ease their condition. MEDICATIONS Chromium Several studies report that chromium supplements may improve diabetes control. Chromium helps insulin improve its action. Research is not yet certain. Supplements have not been recommended or approved. Caution is needed if you have kidney (renal) problems. Ginseng There are several types of ginseng plants. American ginseng is used for diabetes studies. Those studies have shown some glucose-lowering effects. Those effects have been seen with fasting and after-meal blood glucose levels. They have also been seen in A1c levels (average blood glucose levels over a 26-month period). More long-term studies are needed before recommendations for use of ginseng can be made. Magnesium Experts have studied the relationship between magnesium and diabetes for many years. But  it is not yet fully understood. Studies suggest that a low amount of magnesium may make blood glucose control worse in type 2 diabetes. Research also shows that a low amount may contribute to certain diabetes complications. One study showed that people who consume more magnesium had less risk of type 2 diabetes. Eating whole grains, nuts, and green leafy vegetables raises the magnesium level. Vanadium Vanadium is a compound found in tiny amounts in plants and animals. Early studies showed that vanadium improved blood glucose levels in animals with type 1 and type 2 diabetes. One study found that when given vanadium, those with diabetes were able to decrease their insulin dosage. Researchers still need to learn how it works in the body to discover any side effects, and to find safe dosages. Cinnamon There have been a couple of studies that seem to indicate cinnamon decreases insulin resistance and increases insulin production. By doing so, it may lower blood glucose. Exact doses are unknown, but it may work best when used in combination with other  diabetes medicines. Document Released: 05/08/2007 Document Revised: 10/03/2011 Document Reviewed: 05/21/2009 [facility] Patient Information 2015 [place], [place]. This information is not intended to replace advice given to you by your health care provider. Make sure you discuss any questions you have with your health care provider. Diabetes Mellitus and Food It is important for you to manage your blood sugar (glucose) level. Your blood glucose level can be greatly affected by what you eat. Eating healthier foods in the appropriate amounts throughout the day at about the same time each day will help you control your blood glucose level. It can also help slow or prevent worsening of your diabetes mellitus. Healthy eating may even help you improve the level of your blood pressure and reach or maintain a healthy weight.  HOW CAN FOOD AFFECT  ME? Carbohydrates Carbohydrates affect your blood glucose level more than any other type of food. Your dietitian will help you determine how many carbohydrates to eat at each meal and teach you how to count carbohydrates. Counting carbohydrates is important to keep your blood glucose at a healthy level, especially if you are using insulin or taking certain medicines for diabetes mellitus. Alcohol Alcohol can cause sudden decreases in blood glucose (hypoglycemia), especially if you use insulin or take certain medicines for diabetes mellitus. Hypoglycemia can be a life-threatening condition. Symptoms of hypoglycemia (sleepiness, dizziness, and disorientation) are similar to symptoms of having too much alcohol.  If your health care provider has given you approval to drink alcohol, do so in moderation and use the following guidelines:  Women should not have more than one drink per day, and men should not have more than two drinks per day. One drink is equal to:  12 oz of beer.  5 oz of wine.  1 oz of hard liquor.  Do not drink on an empty stomach.  Keep yourself hydrated. Have water, diet soda, or unsweetened iced tea.  Regular soda, juice, and other mixers might contain a lot of carbohydrates and should be counted. WHAT FOODS ARE NOT RECOMMENDED? As you make food choices, it is important to remember that all foods are not the same. Some foods have fewer nutrients per serving than other foods, even though they might have the same number of calories or carbohydrates. It is difficult to get your body what it needs when you eat foods with fewer nutrients. Examples of foods that you should avoid that are high in calories and carbohydrates but low in nutrients include:  Trans fats (most processed foods list trans fats on the Nutrition Facts label).  Regular soda.  Juice.  Candy.  Sweets, such as cake, pie, doughnuts, and cookies.  Fried foods. WHAT FOODS CAN I EAT? Have nutrient-rich  foods, which will nourish your body and keep you healthy. The food you should eat also will depend on several factors, including:  The calories you need.  The medicines you take.  Your weight.  Your blood glucose level.  Your blood pressure level.  Your cholesterol level. You also should eat a variety of foods, including:  Protein, such as meat, poultry, fish, tofu, nuts, and seeds (lean animal proteins are best).  Fruits.  Vegetables.  Dairy products, such as milk, cheese, and yogurt (low fat is best).  Breads, grains, pasta, cereal, rice, and beans.  Fats such as olive oil, trans fat-free margarine, canola oil, avocado, and olives. DOES EVERYONE WITH DIABETES MELLITUS HAVE THE SAME MEAL PLAN? Because every person with diabetes mellitus is different, there is not  one meal plan that works for everyone. It is very important that you meet with a dietitian who will help you create a meal plan that is just right for you. Document Released: 04/07/2005 Document Revised: 07/16/2013 Document Reviewed: 06/07/2013 [facility] Patient Information 2015 [place], [place]. This information is not intended to replace advice given to you by your health care provider. Make sure you discuss any questions you have with your health care provider.

## 2014-11-21 NOTE — Progress Notes (Signed)
Subjective:  This chart was scribed for Alejandro Cheadle, MD by Alejandro [name], medical scribe at [facility].The patient was seen in exam room 25 and the patient's care was started at 2:12 PM.   Patient ID: Alejandro [name], male    DOB: Aug 20, 1960, 54 y.o.   MRN: [id number] Chief Complaint  Patient presents with  . Follow-up  . Diabetes  . Hypertension  . Referral    FOOT DR. & LBP   HPI  HPI Comments: Alejandro [name] is a 54 y.o. male with a history of hypertension and diabetes who presents to [facility] for a follow up.   Last seen two months ago, after he was newly diagnosed with type II diabetes, blood glucose was 713 at that visit. He was on metformin 500 mg BID, diarrhea as a side effect but was going to continue and asked to raise dose to 1000 mg if he could tolerate. Started diabetic nutrition sources, where he was recommended to cut out soda and sugar in his coffee. Discussed monitoring CBG and asked to bring in a log at follow up visit. Blood sugars have been around 110-130, occasional 200. Some in the 80-90 and he has felt fine here. Pt still has intermittent diarrhea, his stool is only more loose. Pt has developed calluses on the soles of his feet. He denies urinary symptoms and cough. He has changed his diet drastically.   He is taking lisinopril for hypertension.  Pt has a history of Hepatitis C. He was referred to clinic to see if he could qualify for treatment.  Also having lower back pain and treated with as needed flexeril and tramadol. Heat and stretching. Pt was also refereed to physical therapy. Taking his aspirin every day.  Flexeril has been working well at night when he feels pain. Pt works two jobs.One at [employer], the other also strips wax floor. Neither job is too strenuous on his back.  Past Medical History  Diagnosis Date  . Hepatitis C   . Hypertension   . Diabetes mellitus without complication    Current Outpatient  Prescriptions on File Prior to Visit  Medication Sig Dispense Refill  . cyclobenzaprine (FLEXERIL) 10 MG tablet Take 1 tablet (10 mg total) by mouth at bedtime. And during the day as needed up to every 8 hours for muscle spasm 30 tablet 0  . glucose blood (ONE TOUCH ULTRA TEST) test strip Check blood sugar twice a day. 200 each 1  . lisinopril-hydrochlorothiazide (PRINZIDE,ZESTORETIC) 20-25 MG per tablet Take 1 tablet by mouth daily. 90 tablet 1  . metFORMIN (GLUCOPHAGE) 500 MG tablet Take 1 tablet (500 mg total) by mouth 2 (two) times daily with a meal. 60 tablet 3  . traMADol (ULTRAM) 50 MG tablet Take 1 tablet (50 mg total) by mouth every 8 (eight) hours as needed. 30 tablet 1  . [DISCONTINUED] atorvastatin (LIPITOR) 20 MG tablet Take 1 tablet (20 mg total) by mouth daily. (Patient not taking: Reported on 09/14/2014) 90 tablet 3  . [DISCONTINUED] lansoprazole (PREVACID) 30 MG capsule Take 1 capsule (30 mg total) by mouth daily at 12 noon. (Patient not taking: Reported on 09/14/2014) 30 capsule 5   No current facility-administered medications on file prior to visit.   Allergies  Allergen Reactions  . Tylenol [Acetaminophen] Hypertension    Review of Systems  Constitutional: Positive for activity change. Negative for fever, chills, diaphoresis, appetite change, fatigue and unexpected weight change.  Gastrointestinal: Positive for diarrhea.  Musculoskeletal: Positive for myalgias, back pain and arthralgias.  Skin: Negative for rash.  Neurological: Negative for tremors, weakness and light-headedness.  Psychiatric/Behavioral: The patient is not nervous/anxious.       Objective:  BP 118/72 mmHg  [name] 82  Temp(Src) 98.8 F (37.1 C)  Resp 16  Ht 5' 9.5" (1.765 m)  Wt 215 lb (97.523 kg)  BMI 31.31 kg/m2  SpO2 99% Physical Exam  Constitutional: He is oriented to person, place, and time. He appears well-developed and well-nourished. No distress.  HENT:  Head: Normocephalic and  atraumatic.  Eyes: Pupils are equal, round, and reactive to light.  Neck: Normal range of motion.  Cardiovascular: Normal rate and regular rhythm.   Pulses:      Dorsalis pedis pulses are 2+ on the right side, and 2+ on the left side.  Pulmonary/Chest: Effort normal. No respiratory distress.  Musculoskeletal: Normal range of motion.  Neurological: He is alert and oriented to person, place, and time.  Skin: Skin is warm and dry.  Psychiatric: He has a normal mood and affect. His behavior is normal.  Nursing note and vitals reviewed.  Diabetic Foot Exam - Simple   Simple Foot Form  Diabetic Foot exam was performed with the following findings:  Yes 11/21/2014  1:32 PM  Visual Inspection  Sensation Testing  [name] Check  Comments    Used 10 blade to lightly debride calluses on plantar surface of first phalanx and MTP    Assessment & Plan:   1. Type 2 diabetes mellitus with other diabetic kidney complication   2. Chronic hepatitis C without hepatic coma   3. Foot callus   4. Plantar wart of left foot   5. Onychomycosis of right great toe     Orders Placed This Encounter  Procedures  . Ambulatory referral to Podiatry    Referral Priority:  Routine    Referral Type:  Consultation    Referral Reason:  Specialty Services Required    Requested Specialty:  Podiatry    Number of Visits Requested:  1    Meds ordered this encounter  Medications  . DISCONTD: metFORMIN (GLUCOPHAGE) 500 MG tablet    Sig: Take 1 tablet (500 mg total) by mouth 2 (two) times daily with a meal.    Dispense:  180 tablet    Refill:  0  . DISCONTD: cyclobenzaprine (FLEXERIL) 10 MG tablet    Sig: Take 1 tablet (10 mg total) by mouth 3 (three) times daily as needed for muscle spasms (and qhs).    Dispense:  90 tablet    Refill:  0  . traMADol (ULTRAM) 50 MG tablet    Sig: Take 1 tablet (50 mg total) by mouth every 8 (eight) hours as needed.    Dispense:  30 tablet    Refill:  1    I personally performed  the services described in this documentation, which was scribed in my presence. The recorded information has been reviewed and considered, and addended by me as needed.  Alejandro Cheadle, MD MPH

## 2014-11-22 ENCOUNTER — Telehealth: Payer: Self-pay

## 2014-11-22 NOTE — Telephone Encounter (Signed)
Patient was seen yesterday by Dr. Brigitte Pulse and his prescriptions were sent to [facility] and he would like this changed to [facility] on [address] so he can go ahead and pick them up. He says its cheaper to keep all his prescriptions with [facility]. Cb# [phone].

## 2014-11-23 MED ORDER — METFORMIN HCL 500 MG PO TABS: 500.0000 mg | ORAL_TABLET | Freq: Two times a day (BID) | ORAL | Status: DC

## 2014-11-23 MED ORDER — CYCLOBENZAPRINE HCL 10 MG PO TABS: 10.0000 mg | ORAL_TABLET | Freq: Three times a day (TID) | ORAL | Status: DC | PRN

## 2014-11-23 NOTE — Telephone Encounter (Signed)
Resent Flexeril and Metformin to [facility]/ called patient to advise.

## 2015-02-24 ENCOUNTER — Encounter: Payer: Self-pay | Admitting: *Deleted

## 2015-03-25 ENCOUNTER — Telehealth: Payer: Self-pay

## 2015-03-25 NOTE — Telephone Encounter (Signed)
Pt is needing prior authorization for his diabtic supplies  Insurance co gave this number to patient for [place] to call   [phone]

## 2015-03-26 NOTE — Telephone Encounter (Signed)
Patient is calling to follow up on refill because he is completely out of strips

## 2015-03-27 ENCOUNTER — Ambulatory Visit (INDEPENDENT_AMBULATORY_CARE_PROVIDER_SITE_OTHER): Payer: BC Managed Care – PPO | Admitting: Emergency Medicine

## 2015-03-27 VITALS — BP 130/86 | HR 86 | Temp 98.0°F | Resp 16 | Ht 69.5 in | Wt 214.0 lb

## 2015-03-27 DIAGNOSIS — E1165 Type 2 diabetes mellitus with hyperglycemia: Secondary | ICD-10-CM

## 2015-03-27 DIAGNOSIS — I10 Essential (primary) hypertension: Secondary | ICD-10-CM

## 2015-03-27 DIAGNOSIS — IMO0002 Reserved for concepts with insufficient information to code with codable children: Secondary | ICD-10-CM

## 2015-03-27 LAB — LIPID PANEL
CHOL/HDL RATIO: 3.3 ratio (ref <=5.0)
Cholesterol: 148 mg/dL (ref 125–200)
HDL: 45 mg/dL (ref >=40)
LDL CALC: 76 mg/dL (ref <130)
TRIGLYCERIDES: 133 mg/dL (ref <150)
VLDL: 27 mg/dL (ref <30)

## 2015-03-27 LAB — COMPREHENSIVE METABOLIC PANEL
ALBUMIN: 4.3 g/dL (ref 3.6–5.1)
ALT: 57 U/L — ABNORMAL HIGH (ref 9–46)
AST: 50 U/L — AB (ref 10–35)
Alkaline Phosphatase: 54 U/L (ref 40–115)
BILIRUBIN TOTAL: 0.4 mg/dL (ref 0.2–1.2)
BUN: 16 mg/dL (ref 7–25)
CALCIUM: 10.1 mg/dL (ref 8.6–10.3)
CHLORIDE: 97 mmol/L — AB (ref 98–110)
CO2: 28 mmol/L (ref 20–31)
Creat: 1.02 mg/dL (ref 0.70–1.33)
Glucose, Bld: 65 mg/dL (ref 65–99)
POTASSIUM: 4.2 mmol/L (ref 3.5–5.3)
Sodium: 135 mmol/L (ref 135–146)
Total Protein: 7.2 g/dL (ref 6.1–8.1)

## 2015-03-27 LAB — POCT URINALYSIS DIPSTICK
BILIRUBIN UA: NEGATIVE
Blood, UA: NEGATIVE
Glucose, UA: NEGATIVE
KETONES UA: NEGATIVE
LEUKOCYTES UA: NEGATIVE
Nitrite, UA: NEGATIVE
PH UA: 5.5
Protein, UA: NEGATIVE
SPEC GRAV UA: 1.01
Urobilinogen, UA: 0.2

## 2015-03-27 LAB — CBC
HCT: 44 % (ref 39.0–52.0)
HEMOGLOBIN: 14.7 g/dL (ref 13.0–17.0)
MCH: 28.5 pg (ref 26.0–34.0)
MCHC: 33.4 g/dL (ref 30.0–36.0)
MCV: 85.4 fL (ref 78.0–100.0)
MPV: 9.3 fL (ref 8.6–12.4)
PLATELETS: 237 10*3/uL (ref 150–400)
RBC: 5.15 MIL/uL (ref 4.22–5.81)
RDW: 13.2 % (ref 11.5–15.5)
WBC: 6.1 10*3/uL (ref 4.0–10.5)

## 2015-03-27 LAB — GLUCOSE, POCT (MANUAL RESULT ENTRY): POC GLUCOSE: 70 mg/dL (ref 70–99)

## 2015-03-27 LAB — POCT GLYCOSYLATED HEMOGLOBIN (HGB A1C): Hemoglobin A1C: 6.3

## 2015-03-27 MED ORDER — LISINOPRIL-HYDROCHLOROTHIAZIDE 20-25 MG PO TABS: 1.0000 | ORAL_TABLET | Freq: Every day | ORAL | Status: DC

## 2015-03-27 MED ORDER — GLUCOSE BLOOD VI STRP: ORAL_STRIP | Status: DC

## 2015-03-27 NOTE — Patient Instructions (Signed)

## 2015-03-27 NOTE — Progress Notes (Signed)
Subjective:  Patient ID: Alejandro [name], male    DOB: Apr 09, 1961  Age: 54 y.o. MRN: [id number]  CC: Diabetes   HPI Alejandro [name] presents  requesting a refill on his glucose strips area he has a history of type 2 diabetes initially seen in February and then again in April. He's been taking 1 g metformin daily and ran out of his test strips while he was in [place] for a month so he hasn't taken his sugar he said that his sugar prior to that was "good" he feels "fine"  History Alejandro [name] has a past medical history of Hepatitis C; Hypertension; and Diabetes mellitus without complication.   He has no past surgical history on file.   His  family history includes Diabetes in his maternal grandfather and paternal grandfather; Heart disease in his mother.  He   reports that he has quit smoking. He has never used smokeless tobacco. He reports that he drinks alcohol. He reports that he does not use illicit drugs.  Outpatient Prescriptions Prior to Visit  Medication Sig Dispense Refill  . cyclobenzaprine (FLEXERIL) 10 MG tablet Take 1 tablet (10 mg total) by mouth 3 (three) times daily as needed for muscle spasms (and qhs). 90 tablet 0  . metFORMIN (GLUCOPHAGE) 500 MG tablet Take 1 tablet (500 mg total) by mouth 2 (two) times daily with a meal. 180 tablet 0  . traMADol (ULTRAM) 50 MG tablet Take 1 tablet (50 mg total) by mouth every 8 (eight) hours as needed. 30 tablet 1  . glucose blood (ONE TOUCH ULTRA TEST) test strip Check blood sugar twice a day. 200 each 1  . lisinopril-hydrochlorothiazide (PRINZIDE,ZESTORETIC) 20-25 MG per tablet Take 1 tablet by mouth daily. 90 tablet 1   No facility-administered medications prior to visit.    Social History   Social History  . Marital Status: Married    Spouse Name: N/A  . Number of Children: N/A  . Years of Education: N/A   Social History Main Topics  . Smoking status: Former Research scientist (life sciences)  . Smokeless tobacco: Never Used  . Alcohol Use: Yes   . Drug Use: No  . Sexual Activity: Yes   Other Topics Concern  . None   Social History Narrative     Review of Systems  Constitutional: Negative for fever, chills and appetite change.  HENT: Negative for congestion, ear pain, postnasal drip, sinus pressure and sore throat.   Eyes: Negative for pain and redness.  Respiratory: Negative for cough, shortness of breath and wheezing.   Cardiovascular: Negative for leg swelling.  Gastrointestinal: Negative for nausea, vomiting, abdominal pain, diarrhea, constipation and blood in stool.  Endocrine: Negative for polyuria.  Genitourinary: Negative for dysuria, urgency, frequency and flank pain.  Musculoskeletal: Negative for gait problem.  Skin: Negative for rash.  Neurological: Negative for weakness and headaches.  Psychiatric/Behavioral: Negative for confusion and decreased concentration. The patient is not nervous/anxious.     Objective:  BP 130/86 mmHg  Pulse 86  Temp(Src) 98 F (36.7 C) (Oral)  Resp 16  Ht 5' 9.5" (1.765 m)  Wt 214 lb (97.07 kg)  BMI 31.16 kg/m2  SpO2 98%  Physical Exam  Constitutional: He is oriented to person, place, and time. He appears well-developed and well-nourished. No distress.  HENT:  Head: Normocephalic and atraumatic.  Right Ear: External ear normal.  Left Ear: External ear normal.  Nose: Nose normal.  Eyes: Conjunctivae and EOM are normal. Pupils are equal, round, and reactive to  light. No scleral icterus.  Neck: Normal range of motion. Neck supple. No tracheal deviation present.  Cardiovascular: Normal rate, regular rhythm and normal heart sounds.   Pulmonary/Chest: Effort normal. No respiratory distress. He has no wheezes. He has no rales.  Abdominal: He exhibits no mass. There is no tenderness. There is no rebound and no guarding.  Musculoskeletal: He exhibits no edema.  Lymphadenopathy:    He has no cervical adenopathy.  Neurological: He is alert and oriented to person, place, and  time. Coordination normal.  Skin: Skin is warm and dry. No rash noted.  Psychiatric: He has a normal mood and affect. His behavior is normal.      Assessment & Plan:   Alejandro [name] was seen today for diabetes.  Diagnoses and all orders for this visit:  Uncontrolled diabetes mellitus -     POCT glucose (manual entry) -     POCT glycosylated hemoglobin (Hb A1C) -     Comprehensive metabolic panel -     Lipid panel -     POCT urinalysis dipstick  Essential hypertension, benign -     POCT glucose (manual entry) -     POCT glycosylated hemoglobin (Hb A1C) -     Comprehensive metabolic panel -     Lipid panel -     POCT urinalysis dipstick -     CBC  Other orders -     glucose blood (ONE TOUCH ULTRA TEST) test strip; Check blood sugar twice a day. -     lisinopril-hydrochlorothiazide (PRINZIDE,ZESTORETIC) 20-25 MG per tablet; Take 1 tablet by mouth daily.   I am having Alejandro [name] maintain his traMADol, metFORMIN, cyclobenzaprine, amoxicillin, HYDROcodone-acetaminophen, glucose blood, and lisinopril-hydrochlorothiazide.  Meds ordered this encounter  Medications  . amoxicillin (AMOXIL) 500 MG capsule    Sig: Take 500 mg by mouth 2 (two) times daily.  Marland Kitchen HYDROcodone-acetaminophen (NORCO/VICODIN) 5-325 MG per tablet    Sig: Take 1 tablet by mouth every 6 (six) hours as needed for moderate pain.  Marland Kitchen glucose blood (ONE TOUCH ULTRA TEST) test strip    Sig: Check blood sugar twice a day.    Dispense:  200 each    Refill:  1  . lisinopril-hydrochlorothiazide (PRINZIDE,ZESTORETIC) 20-25 MG per tablet    Sig: Take 1 tablet by mouth daily.    Dispense:  90 tablet    Refill:  1    Appropriate red flag conditions were discussed with the patient as well as actions that should be taken.  Patient expressed his understanding.  Follow-up: No Follow-up on file.  Roselee Culver, MD   Results for orders placed or performed in visit on 03/27/15  POCT glucose (manual entry)  Result Value  Ref Range   POC Glucose 70 70 - 99 mg/dl  POCT glycosylated hemoglobin (Hb A1C)  Result Value Ref Range   Hemoglobin A1C 6.3   POCT urinalysis dipstick  Result Value Ref Range   Color, UA yellow    Clarity, UA clear    Glucose, UA neg    Bilirubin, UA neg    Ketones, UA neg    Spec Grav, UA 1.010    Blood, UA neg    pH, UA 5.5    Protein, UA neg    Urobilinogen, UA 0.2    Nitrite, UA neg    Leukocytes, UA Negative Negative

## 2015-03-28 ENCOUNTER — Encounter: Payer: Self-pay | Admitting: Family Medicine

## 2015-04-01 NOTE — Telephone Encounter (Signed)
Pt came for OV on 03/27/15 and was given an new Rx. Have not received anything from pharm asking for PA.

## 2015-06-30 ENCOUNTER — Telehealth: Payer: Self-pay | Admitting: *Deleted

## 2015-06-30 ENCOUNTER — Ambulatory Visit (INDEPENDENT_AMBULATORY_CARE_PROVIDER_SITE_OTHER): Payer: BC Managed Care – PPO | Admitting: Physician Assistant

## 2015-06-30 VITALS — BP 128/70 | HR 94 | Temp 98.3°F | Resp 18 | Ht 70.5 in | Wt 213.4 lb

## 2015-06-30 DIAGNOSIS — K648 Other hemorrhoids: Secondary | ICD-10-CM

## 2015-06-30 DIAGNOSIS — K645 Perianal venous thrombosis: Secondary | ICD-10-CM | POA: Diagnosis not present

## 2015-06-30 MED ORDER — HYDROCORTISONE ACETATE 25 MG RE SUPP: 25.0000 mg | Freq: Two times a day (BID) | RECTAL | Status: DC

## 2015-06-30 MED ORDER — PSYLLIUM 28 % PO PACK: 2.0000 | PACK | Freq: Two times a day (BID) | ORAL | Status: DC

## 2015-06-30 MED ORDER — AMOXICILLIN-POT CLAVULANATE 875-125 MG PO TABS: 1.0000 | ORAL_TABLET | Freq: Two times a day (BID) | ORAL | Status: DC

## 2015-06-30 MED ORDER — IBUPROFEN 600 MG PO TABS: 600.0000 mg | ORAL_TABLET | Freq: Three times a day (TID) | ORAL | Status: DC | PRN

## 2015-06-30 NOTE — Progress Notes (Signed)
06/30/2015 1:59 PM   DOB: 1961/05/30 / MRN: [id number]  SUBJECTIVE:  Alejandro [name] is a 54 y.o. male with a history of controlled diabetes. presenting for a worsening of hemorrhoids that worsened 3 days ago.  Reports quite a bit of pain, made worse by sitting.  Denies fever, chills.  He has tried OTC preparation H and tucks pads which has provided minimal relief. Reports that this episode has been made worse by driving to [place].    He is allergic to tylenol.   He  has a past medical history of Hepatitis C; Hypertension; and Diabetes mellitus without complication ([place]).    He  reports that he has quit smoking. He has never used smokeless tobacco. He reports that he drinks alcohol. He reports that he does not use illicit drugs. He  reports that he currently engages in sexual activity. The patient  has no past surgical history on file.  His family history includes Diabetes in his maternal grandfather and paternal grandfather; Heart disease in his mother.  Review of Systems  Constitutional: Negative for fever.  Gastrointestinal: Negative for abdominal pain, diarrhea, constipation and blood in stool.  Neurological: Negative for dizziness.    Problem list and medications reviewed and updated by myself where necessary, and exist elsewhere in the encounter.   OBJECTIVE:  BP 128/70 mmHg  Pulse 94  Temp(Src) 98.3 F (36.8 C) (Oral)  Resp 18  Ht 5' 10.5" (1.791 m)  Wt 213 lb 6.4 oz (96.798 kg)  BMI 30.18 kg/m2  SpO2 97% CrCl cannot be calculated (Patient has no serum creatinine result on file.).  Physical Exam  Constitutional: He is oriented to person, place, and time.  Cardiovascular: Normal rate.   Pulmonary/Chest: Effort normal.  Abdominal: Soft. Bowel sounds are normal.  Genitourinary:     Musculoskeletal: Normal range of motion.  Neurological: He is alert and oriented to person, place, and time.  Skin: Skin is warm and dry.  Psychiatric: He has a normal mood and  affect.  Vitals reviewed.  Risk and benefits discussed and verbal consent obtained. Anesthetic allergies reviewed. Patient anesthetized using 1:1 mix of 2% lidocaine with epi. A 1 cm incision was made using a number 11 blade and purulent material was expressed.  The was not wound packed. The patient tolerated the procedure without difficulty.   A clean dressing was placed and wound care instructions were provided.  Philis Fendt, MS, PA-C 1:59 PM, 06/30/2015    No results found for this or any previous visit (from the past 48 hour(s)).  ASSESSMENT AND PLAN  Alejandro [name] was seen today for hemorrhoids.  Diagnoses and all orders for this visit:  Thrombosed external hemorrhoid: He has numerous hemorrhoids.  I am concerned for the possibility of infection and I was unable to remove any thrombosis.  Will send to to a surgeon for further evaluation and management.  He does no take any fiber and I am starting this today.   -     amoxicillin-clavulanate (AUGMENTIN) 875-125 MG tablet; Take 1 tablet by mouth 2 (two) times daily. -     ibuprofen (ADVIL,MOTRIN) 600 MG tablet; Take 1 tablet (600 mg total) by mouth every 8 (eight) hours as needed. -     psyllium (METAMUCIL SMOOTH TEXTURE) 28 % packet; Take 2 packets by mouth 2 (two) times daily. -     Ambulatory referral to General Surgery -     hydrocortisone (ANUSOL-HC) 25 MG suppository; Place 1 suppository (25 mg total) rectally  2 (two) times daily.    The patient was advised to call or return to clinic if he does not see an improvement in symptoms or to seek the care of the closest emergency department if he worsens with the above plan.   Philis Fendt, MHS, PA-C [facility] 06/30/2015 1:59 PM

## 2015-06-30 NOTE — Telephone Encounter (Signed)
Tried to call pt to let him know that Alejandro [name] sent in some suppositories for him  No answer/ No VM

## 2015-07-01 NOTE — Telephone Encounter (Signed)
Spoke with pt, he would like his procedure done after 07/15/15 because he is off of work until the [employer].

## 2015-07-01 NOTE — Telephone Encounter (Signed)
Pt.notified

## 2015-08-26 ENCOUNTER — Ambulatory Visit (INDEPENDENT_AMBULATORY_CARE_PROVIDER_SITE_OTHER): Payer: BC Managed Care – PPO | Admitting: Physician Assistant

## 2015-08-26 VITALS — BP 146/86 | HR 87 | Temp 98.0°F | Resp 18 | Ht 69.0 in | Wt 224.0 lb

## 2015-08-26 DIAGNOSIS — Z139 Encounter for screening, unspecified: Secondary | ICD-10-CM | POA: Diagnosis not present

## 2015-08-26 DIAGNOSIS — Z23 Encounter for immunization: Secondary | ICD-10-CM

## 2015-08-26 DIAGNOSIS — R894 Abnormal immunological findings in specimens from other organs, systems and tissues: Secondary | ICD-10-CM

## 2015-08-26 DIAGNOSIS — E119 Type 2 diabetes mellitus without complications: Secondary | ICD-10-CM | POA: Diagnosis not present

## 2015-08-26 DIAGNOSIS — R768 Other specified abnormal immunological findings in serum: Secondary | ICD-10-CM

## 2015-08-26 LAB — POCT GLYCOSYLATED HEMOGLOBIN (HGB A1C): Hemoglobin A1C: 7

## 2015-08-26 LAB — COMPLETE METABOLIC PANEL WITHOUT GFR
ALT: 66 U/L — ABNORMAL HIGH (ref 9–46)
AST: 55 U/L — ABNORMAL HIGH (ref 10–35)
Albumin: 3.8 g/dL (ref 3.6–5.1)
Alkaline Phosphatase: 49 U/L (ref 40–115)
BUN: 10 mg/dL (ref 7–25)
CO2: 27 mmol/L (ref 20–31)
Calcium: 8.9 mg/dL (ref 8.6–10.3)
Chloride: 99 mmol/L (ref 98–110)
Creat: 0.99 mg/dL (ref 0.70–1.33)
GFR, Est African American: >89 mL/min
GFR, Est Non African American: 85 mL/min
Glucose, Bld: 201 mg/dL — ABNORMAL HIGH (ref 65–99)
Potassium: 4.6 mmol/L (ref 3.5–5.3)
Sodium: 136 mmol/L (ref 135–146)
Total Bilirubin: 0.4 mg/dL (ref 0.2–1.2)
Total Protein: 6.6 g/dL (ref 6.1–8.1)

## 2015-08-26 LAB — GLUCOSE, POCT (MANUAL RESULT ENTRY): POC Glucose: 211 mg/dL — AB (ref 70–99)

## 2015-08-26 MED ORDER — METFORMIN HCL 1000 MG PO TABS: 1000.0000 mg | ORAL_TABLET | Freq: Two times a day (BID) | ORAL | Status: DC

## 2015-08-26 NOTE — Patient Instructions (Addendum)
Continue your metformin.   Go to your eye exam.  I have placed a referral for you.   Start a fiber regimen using metamucil.   Check your sugar daily, in the morning before eating.

## 2015-08-26 NOTE — Progress Notes (Signed)
08/27/2015 12:29 PM   DOB: 02/14/1961 / MRN: [id number]  SUBJECTIVE:  Alejandro [name] is a 55 y.o. male presenting for a recheck of his diabetes.  He has quit smoking.  He checks his blood sugar once daily and reports sugars ranging in the 150-200 fasting.  Denies sock and glove paresthesia.  Denies chest pain, SOB, new DOE.  Has not had an eye exam.   He is allergic to tylenol.   He  has a past medical history of Hepatitis C; Hypertension; and Diabetes mellitus without complication ([place]).    He  reports that he has quit smoking. He has never used smokeless tobacco. He reports that he drinks alcohol. He reports that he does not use illicit drugs. He  reports that he currently engages in sexual activity. The patient  has no past surgical history on file.  His family history includes Diabetes in his maternal grandfather and paternal grandfather; Heart disease in his mother.  Review of Systems  Constitutional: Negative for fever and chills.  Eyes: Negative for blurred vision.  Respiratory: Negative for cough.   Cardiovascular: Negative for chest pain.  Gastrointestinal: Negative for nausea and abdominal pain.  Skin: Negative for rash.  Neurological: Negative for dizziness and headaches.    Problem list and medications reviewed and updated by myself where necessary, and exist elsewhere in the encounter.   OBJECTIVE:  BP 146/86 mmHg  Pulse 87  Temp(Src) 98 F (36.7 C) (Oral)  Resp 18  Ht _0  (1.753 m)  Wt 224 lb (101.606 kg)  BMI 33.06 kg/m2  SpO2 99%  Physical Exam  Constitutional: He is oriented to person, place, and time. He appears well-developed. He does not appear ill.  Eyes: Conjunctivae and EOM are normal. Pupils are equal, round, and reactive to light.  Cardiovascular: Normal rate.   Pulmonary/Chest: Effort normal.  Abdominal: He exhibits no distension.  Musculoskeletal: Normal range of motion.  Neurological: He is alert and oriented to person, place, and time. No  cranial nerve deficit. Coordination normal.  Skin: Skin is warm and dry. He is not diaphoretic.  Psychiatric: He has a normal mood and affect.  Nursing note and vitals reviewed.   Results for orders placed or performed in visit on 08/26/15 (from the past 72 hour(s))  COMPLETE METABOLIC PANEL WITH GFR     Status: Abnormal   Collection Time: 08/26/15  2:10 PM  Result Value Ref Range   Sodium 136 135 - 146 mmol/L   Potassium 4.6 3.5 - 5.3 mmol/L   Chloride 99 98 - 110 mmol/L   CO2 27 20 - 31 mmol/L   Glucose, Bld 201 (H) 65 - 99 mg/dL   BUN 10 7 - 25 mg/dL   Creat 0.99 0.70 - 1.33 mg/dL   Total Bilirubin 0.4 0.2 - 1.2 mg/dL   Alkaline Phosphatase 49 40 - 115 U/L   AST 55 (H) 10 - 35 U/L   ALT 66 (H) 9 - 46 U/L   Total Protein 6.6 6.1 - 8.1 g/dL   Albumin 3.8 3.6 - 5.1 g/dL   Calcium 8.9 8.6 - 10.3 mg/dL   GFR, Est African American >89 >=60 mL/min   GFR, Est Non African American 85 >=60 mL/min    Comment:   The estimated GFR is a calculation valid for adults (>=22 years old) that uses the CKD-EPI algorithm to adjust for age and sex. It is   not to be used for children, pregnant women, hospitalized patients,  patients on dialysis, or with rapidly changing kidney function. According to the NKDEP, eGFR >89 is normal, 60-89 shows mild impairment, 30-59 shows moderate impairment, 15-29 shows severe impairment and <15 is ESRD.     Microalbumin, urine     Status: None   Collection Time: 08/26/15  2:17 PM  Result Value Ref Range   Microalb, Ur <0.2 Not estab mg/dL    Comment: The ADA has defined abnormalities in albumin excretion as follows:           Category           Result                            (mcg/mg creatinine)                 Normal:    <30       Microalbuminuria:    30 - 299   Clinical albuminuria:    > or = 300   The ADA recommends that at least two of three specimens collected within a 3 - 6 month period be abnormal before considering a patient to be within a  diagnostic category.     Hepatitis C antibody     Status: Abnormal   Collection Time: 08/26/15  2:17 PM  Result Value Ref Range   HCV Ab REACTIVE (A) NEGATIVE  Hepatitis C RNA quantitative     Status: None (Preliminary result)   Collection Time: 08/26/15  2:17 PM  Result Value Ref Range   HCV Quantitative  <15 IU/mL   HCV Quantitative Log  <1.18 log 10    Comment:   This test utilizes the [place] FDA approved Roche HCV Test Kit by RT-PCR.   POCT glycosylated hemoglobin (Hb A1C)     Status: None   Collection Time: 08/26/15  2:38 PM  Result Value Ref Range   Hemoglobin A1C 7.0   POCT glucose (manual entry)     Status: Abnormal   Collection Time: 08/26/15  2:38 PM  Result Value Ref Range   POC Glucose 211 (A) 70 - 99 mg/dl    No results found.  ASSESSMENT AND PLAN  Alejandro [name] was seen today for diabetes and medication refill.  Diagnoses and all orders for this visit:  Controlled type 2 diabetes mellitus without complication, without long-term current use of insulin ([place]): He has good control however somewhat elevated from last check.  I will increase his metformin to 1000 mg BID. RTC in 3 months for recheck.  I have rechecked his BP personally and this was 135/80.  His urine micro albumin is negative.  Will hold off of ACE inhibitor for now.         -     POCT glycosylated hemoglobin (Hb A1C) -     POCT glucose (manual entry) -     COMPLETE METABOLIC PANEL WITH GFR -     HM Diabetes Foot Exam -     Ambulatory referral to Ophthalmology -     Microalbumin, urine -     Ambulatory referral to Podiatry -     Hepatitis C antibody  Hepatitis C antibody test positive: Will await reflex and refer to ID as needed.    Other orders -     metFORMIN (GLUCOPHAGE) 1000 MG tablet; Take 1 tablet (1,000 mg total) by mouth 2 (two) times daily with a meal. -     Pneumococcal polysaccharide vaccine 23-valent greater than  or equal to 2yo subcutaneous/IM -     Hepatitis C RNA quantitative    The  patient was advised to call or return to clinic if he does not see an improvement in symptoms or to seek the care of the closest emergency department if he worsens with the above plan.   Philis Fendt, MHS, PA-C [facility] 08/27/2015 12:29 PM

## 2015-08-27 LAB — MICROALBUMIN, URINE

## 2015-08-27 LAB — HEPATITIS C ANTIBODY: HCV AB: REACTIVE — AB

## 2015-08-28 LAB — HEPATITIS C RNA QUANTITATIVE
HCV QUANT LOG: 5.98 {Log} — AB (ref <1.18)
HCV Quantitative: 956997 IU/mL — ABNORMAL HIGH (ref <15)

## 2015-09-18 ENCOUNTER — Encounter: Payer: Self-pay | Admitting: Podiatry

## 2015-09-18 ENCOUNTER — Ambulatory Visit (INDEPENDENT_AMBULATORY_CARE_PROVIDER_SITE_OTHER): Payer: BC Managed Care – PPO | Admitting: Podiatry

## 2015-09-18 VITALS — BP 162/69 | HR 75 | Resp 12

## 2015-09-18 DIAGNOSIS — B351 Tinea unguium: Secondary | ICD-10-CM | POA: Diagnosis not present

## 2015-09-18 DIAGNOSIS — M79674 Pain in right toe(s): Secondary | ICD-10-CM

## 2015-09-18 DIAGNOSIS — M79675 Pain in left toe(s): Secondary | ICD-10-CM

## 2015-09-18 DIAGNOSIS — L84 Corns and callosities: Secondary | ICD-10-CM | POA: Diagnosis not present

## 2015-09-18 NOTE — Progress Notes (Signed)
   Subjective:    Patient ID: Alejandro [name], male    DOB: July 16, 1961, 55 y.o.   MRN: [id number]  HPI  55 year old male presents the office concerns of callus throughout this big toes as well as for painful elongated toenails which she cannot to himself. Denies any redness or drainage.  He states his blood sugar is better controlled and ready below 200. Denies history of ulceration. No other complaints at this time.    Review of Systems  Skin: Positive for color change.       Objective:   Physical Exam General: AAO x3, NAD  Dermatological: Hyperkeratotic lesions bilateral hallux. Upon debridement no underlying ulceration, drainage or other signs of infection. The nails are hypertrophic, dystrophic, brittle, discolored, elongated 10. No swelling erythema or drainage. There is tenderness in nails modified bilaterally. No open lesions or pre-ulcerative lesions otherwise.  Vascular: Dorsalis Pedis artery and Posterior Tibial artery pedal pulses are 2/4 bilateral with immedate capillary fill time. Pedal hair growth present. No varicosities and no lower extremity edema present bilateral. There is no pain with calf compression, swelling, warmth, erythema.   Neruologic: Grossly intact via light touch bilateral. Vibratory intact via tuning fork bilateral. Protective threshold with Semmes Wienstein monofilament intact to all pedal sites bilateral. Patellar and Achilles deep tendon reflexes 2+ bilateral. No Babinski or clonus noted bilateral.   Musculoskeletal: No gross boney pedal deformities bilateral. No pain, crepitus, or limitation noted with foot and ankle range of motion bilateral. Muscular strength 5/5 in all groups tested bilateral.  Gait: Unassisted, Nonantalgic.       Assessment & Plan:  Symptomatic onychomycosis, hyperkeratotic lesions -Treatment options discussed including all alternatives, risks, and complications -Etiology of symptoms were discussed -Nails debrided 10 without  complications or bleeding -Hyperkeratotic lesions debrided 2 without complications or bleeding. -Daily foot inspection. -Follow-up in 3 months or sooner if any problems arise. In the meantime, encouraged to call the office with any questions, concerns, change in symptoms.   Celesta Gentile, DPM

## 2015-09-21 ENCOUNTER — Encounter: Payer: Self-pay | Admitting: Podiatry

## 2015-10-13 ENCOUNTER — Telehealth: Payer: Self-pay | Admitting: Family Medicine

## 2015-10-13 DIAGNOSIS — Z1211 Encounter for screening for malignant neoplasm of colon: Secondary | ICD-10-CM

## 2015-10-13 NOTE — Telephone Encounter (Signed)
Spoke with patient and he declined his flu shot.   He would like for [place] to refer him for his colonoscopy.  I sent in the referral to [facility].

## 2015-10-13 NOTE — Telephone Encounter (Signed)
Unable to leave message at this time.  Will try again later to reach patient about his flu shot.

## 2015-10-29 ENCOUNTER — Telehealth: Payer: Self-pay

## 2015-10-29 DIAGNOSIS — Z1211 Encounter for screening for malignant neoplasm of colon: Secondary | ICD-10-CM

## 2015-10-29 NOTE — Telephone Encounter (Signed)
Patient called in and wants a referral for a colonoscopy please. He feels it's time to get it done.

## 2015-10-30 NOTE — Telephone Encounter (Signed)
See chart - he was referred on 3/21 - looks like they are going to get him in to see Dr. Fuller Plan at [facility] but that he apparently needs to get reports of his last endoscopy done elsewhere sent to [facility] prior to him being scheduled per referral notes. . . .

## 2015-10-30 NOTE — Telephone Encounter (Signed)
[facility] faxing reports from Colonoscopy and endoscopy completed March 2009.  Will forward them to [facility] - Dr. Fuller Plan.

## 2015-10-30 NOTE — Telephone Encounter (Signed)
Order pended

## 2015-10-30 NOTE — Telephone Encounter (Signed)
Received reports, faxed to Dr. Fuller Plan, placed in scan slot to be scanned into patient's records.

## 2015-11-02 ENCOUNTER — Encounter: Payer: Self-pay | Admitting: Gastroenterology

## 2015-11-12 ENCOUNTER — Ambulatory Visit (INDEPENDENT_AMBULATORY_CARE_PROVIDER_SITE_OTHER): Payer: BC Managed Care – PPO | Admitting: Family Medicine

## 2015-11-12 VITALS — BP 116/70 | HR 80 | Temp 98.7°F | Resp 18 | Ht 70.0 in | Wt 227.0 lb

## 2015-11-12 DIAGNOSIS — B182 Chronic viral hepatitis C: Secondary | ICD-10-CM

## 2015-11-12 DIAGNOSIS — E1129 Type 2 diabetes mellitus with other diabetic kidney complication: Secondary | ICD-10-CM

## 2015-11-12 DIAGNOSIS — Z114 Encounter for screening for human immunodeficiency virus [HIV]: Secondary | ICD-10-CM | POA: Diagnosis not present

## 2015-11-12 DIAGNOSIS — Z125 Encounter for screening for malignant neoplasm of prostate: Secondary | ICD-10-CM | POA: Diagnosis not present

## 2015-11-12 LAB — COMPREHENSIVE METABOLIC PANEL
ALK PHOS: 49 U/L (ref 40–115)
ALT: 73 U/L — AB (ref 9–46)
AST: 62 U/L — ABNORMAL HIGH (ref 10–35)
Albumin: 4 g/dL (ref 3.6–5.1)
BUN: 16 mg/dL (ref 7–25)
CO2: 24 mmol/L (ref 20–31)
CREATININE: 1.14 mg/dL (ref 0.70–1.33)
Calcium: 9.4 mg/dL (ref 8.6–10.3)
Chloride: 101 mmol/L (ref 98–110)
Glucose, Bld: 121 mg/dL — ABNORMAL HIGH (ref 65–99)
Potassium: 4.1 mmol/L (ref 3.5–5.3)
SODIUM: 136 mmol/L (ref 135–146)
TOTAL PROTEIN: 7.1 g/dL (ref 6.1–8.1)
Total Bilirubin: 0.4 mg/dL (ref 0.2–1.2)

## 2015-11-12 LAB — CBC WITH DIFFERENTIAL/PLATELET
BASOS PCT: 0 %
Basophils Absolute: 0 cells/uL (ref 0–200)
EOS PCT: 1 %
Eosinophils Absolute: 66 cells/uL (ref 15–500)
HCT: 43.3 % (ref 38.5–50.0)
Hemoglobin: 14.3 g/dL (ref 13.2–17.1)
Lymphocytes Relative: 36 %
Lymphs Abs: 2376 cells/uL (ref 850–3900)
MCH: 29.1 pg (ref 27.0–33.0)
MCHC: 33 g/dL (ref 32.0–36.0)
MCV: 88 fL (ref 80.0–100.0)
MONOS PCT: 11 %
MPV: 9.8 fL (ref 7.5–12.5)
Monocytes Absolute: 726 cells/uL (ref 200–950)
NEUTROS ABS: 3432 {cells}/uL (ref 1500–7800)
Neutrophils Relative %: 52 %
PLATELETS: 191 10*3/uL (ref 140–400)
RBC: 4.92 MIL/uL (ref 4.20–5.80)
RDW: 13.7 % (ref 11.0–15.0)
WBC: 6.6 10*3/uL (ref 3.8–10.8)

## 2015-11-12 LAB — POCT GLYCOSYLATED HEMOGLOBIN (HGB A1C): HEMOGLOBIN A1C: 6.5

## 2015-11-12 LAB — LIPID PANEL
CHOLESTEROL: 168 mg/dL (ref 125–200)
HDL: 47 mg/dL (ref >=40)
LDL Cholesterol: 73 mg/dL (ref <130)
Total CHOL/HDL Ratio: 3.6 Ratio (ref <=5.0)
Triglycerides: 238 mg/dL — ABNORMAL HIGH (ref <150)
VLDL: 48 mg/dL — AB (ref <30)

## 2015-11-12 LAB — GLUCOSE, POCT (MANUAL RESULT ENTRY): POC GLUCOSE: 138 mg/dL — AB (ref 70–99)

## 2015-11-12 NOTE — Progress Notes (Signed)
Subjective:    Patient ID: Alejandro [name], male    DOB: Jan 10, 1961, 55 y.o.   MRN: [id number]  11/12/2015  Follow-up   HPI This 55 y.o. male presents for three month follow-up of the following:   1. 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  Last HgbA1c of 7.0.  Sugars ranging from 90-300.  Non-compliant with Metformin; misses twice per week.  Checks sugar once daily.  Last diabetic eye exam; plans to complete through [place]. Weight up 12 pounds in year; walks a lot.  No formal exercise.  2. HTN: Patient reports good compliance with medication, good tolerance to medication, and good symptom control.  Home BP 114-140/70.  Takes Lisinopril every day.  No ASA.  Drinks 3 glasses of red wine per week.  3. Hepatitis C:  No iv drug use; no tattoos; no blood transfusions; was stationed in [place].    4.  Bowel irregularity/constipation: after eating, usually will need to have a bowel movement.  Has noticed that having smaller bowel movements than usual; ongoing for past several months with recent worsening in past two weeks.  Has been exposed to contaminated water while in service in [place].  Due for repeat colonoscopy in June 2017.  Possible contamination while in [place].   Review of Systems  Constitutional: Negative for fever, chills, diaphoresis, activity change, appetite change and fatigue.  Respiratory: Negative for cough and shortness of breath.   Cardiovascular: Negative for chest pain, palpitations and leg swelling.  Gastrointestinal: Positive for constipation. Negative for nausea, vomiting, abdominal pain, diarrhea, blood in stool, abdominal distention, anal bleeding and rectal pain.  Endocrine: Negative for cold intolerance, heat intolerance, polydipsia, polyphagia and polyuria.  Skin: Negative for color change, rash and wound.  Neurological: Negative for dizziness, tremors, seizures, syncope, facial asymmetry, speech  difficulty, weakness, light-headedness, numbness and headaches.  Psychiatric/Behavioral: Negative for sleep disturbance and dysphoric mood. The patient is not nervous/anxious.     Past Medical History  Diagnosis Date  . Hepatitis C   . Hypertension   . Diabetes mellitus without complication ([place])    History reviewed. No pertinent past surgical history. Allergies  Allergen Reactions  . Tylenol [Acetaminophen] Hypertension   Current Outpatient Prescriptions  Medication Sig Dispense Refill  . glucose blood (ONE TOUCH ULTRA TEST) test strip Check blood sugar twice a day. 200 each 1  . lisinopril-hydrochlorothiazide (PRINZIDE,ZESTORETIC) 20-25 MG per tablet Take 1 tablet by mouth daily. 90 tablet 1  . metFORMIN (GLUCOPHAGE) 1000 MG tablet Take 1 tablet (1,000 mg total) by mouth 2 (two) times daily with a meal. 180 tablet 3  . [DISCONTINUED] atorvastatin (LIPITOR) 20 MG tablet Take 1 tablet (20 mg total) by mouth daily. (Patient not taking: Reported on 09/14/2014) 90 tablet 3  . [DISCONTINUED] lansoprazole (PREVACID) 30 MG capsule Take 1 capsule (30 mg total) by mouth daily at 12 noon. (Patient not taking: Reported on 09/14/2014) 30 capsule 5   No current facility-administered medications for this visit.   Social History   Social History  . Marital Status: Married    Spouse Name: N/A  . Number of Children: N/A  . Years of Education: N/A   Occupational History  . Not on file.   Social History Main Topics  . Smoking status: Former Research scientist (life sciences)  . Smokeless tobacco: Never Used  . Alcohol Use: Yes  . Drug Use: No  . Sexual Activity: Yes   Other Topics Concern  . Not  on file   Social History Narrative   Marital status: married x 24 years      Children: 1 child; no grandchildren      Lives: with wife; daughter in [place]      Employment: Estate agent x 14 years; [employer]      Tobacco: quit smoking after ten years       Alcohol: 3 glasses of wine weekly      Exercise: none            Family History  Problem Relation Age of Onset  . Heart disease Mother 3    CAD/AMI  . Diabetes Mother   . Hypertension Mother   . Diabetes Maternal Grandfather   . Diabetes Paternal Grandfather   . Diabetes Father   . Diabetes Sister   . Hypertension Sister   . Hypertension Brother   . Diabetes Brother        Objective:    BP 116/70 mmHg  Pulse 80  Temp(Src) 98.7 F (37.1 C) (Oral)  Resp 18  Ht 5\' 10"  (1.778 m)  Wt 227 lb (102.967 kg)  BMI 32.57 kg/m2  SpO2 98% Physical Exam  Constitutional: He is oriented to person, place, and time. He appears well-developed and well-nourished. No distress.  HENT:  Head: Normocephalic and atraumatic.  Right Ear: External ear normal.  Left Ear: External ear normal.  Nose: Nose normal.  Mouth/Throat: Oropharynx is clear and moist.  Eyes: Conjunctivae and EOM are normal. Pupils are equal, round, and reactive to light.  Neck: Normal range of motion. Neck supple. Carotid bruit is not present. No thyromegaly present.  Cardiovascular: Normal rate, regular rhythm, normal heart sounds and intact distal pulses.  Exam reveals no gallop and no friction rub.   No murmur heard. Pulmonary/Chest: Effort normal and breath sounds normal. He has no wheezes. He has no rales.  Abdominal: Soft. Bowel sounds are normal. He exhibits no distension and no mass. There is no tenderness. There is no rebound and no guarding.  Lymphadenopathy:    He has no cervical adenopathy.  Neurological: He is alert and oriented to person, place, and time. No cranial nerve deficit.  Skin: Skin is warm and dry. No rash noted. He is not diaphoretic.  Psychiatric: He has a normal mood and affect. His behavior is normal.  Nursing note and vitals reviewed.  Results for orders placed or performed in visit on 11/12/15  POCT glucose (manual entry)  Result Value Ref Range   POC Glucose 138 (A) 70 - 99 mg/dl  POCT  glycosylated hemoglobin (Hb A1C)  Result Value Ref Range   Hemoglobin A1C 6.5        Assessment & Plan:   1. Type 2 diabetes mellitus with other diabetic kidney complication, without long-term current use of insulin ([place])   2. Chronic hepatitis C without hepatic coma ([facility])   3. Screening for HIV (human immunodeficiency virus)   4. Screening for prostate cancer    -controlled diabetes; continue Metformin bid.  Recommend annual eye exam; [facility] -discussed the need for statin due to DMII and age.  Pt to consider.   -recommend ASA 81mg  daily per current guidelines. -highly recommend referral to ID for Hepatitis C; pt desires to undergo treatment via [facility]. -obtain appropriate age appropriate screening. -highly recommend exercise, weight loss, low-carbohydrate food choices.  Orders Placed This Encounter  Procedures  . CBC with  Differential/Platelet  . Comprehensive metabolic panel    Order Specific Question:  Has the patient fasted?    Answer:  Yes  . Lipid panel    Order Specific Question:  Has the patient fasted?    Answer:  Yes  . PSA  . HIV antibody  . POCT glucose (manual entry)  . POCT glycosylated hemoglobin (Hb A1C)   No orders of the defined types were placed in this encounter.    Return in about 3 months (around 02/11/2016).    Poet Hineman Elayne Guerin, M.D. [facility] [address] [place], [place]  [address] [phone] phone [phone] fax

## 2015-11-12 NOTE — Patient Instructions (Addendum)
1.  Recommend yearly diabetic eye exam. 2.  Recommend referral to specialist for treatment of hepatitis C. 3.  Recommend weight loss, exercise, low-sugar and low-carbohydrate food choices.    IF you received an x-ray today, you will receive an invoice from [facility]. Please contact [facility] at [phone] with questions or concerns regarding your invoice.   IF you received labwork today, you will receive an invoice from [facility]. Please contact [facility] at [phone] with questions or concerns regarding your invoice.   Our billing staff will not be able to assist you with questions regarding bills from these companies.  You will be contacted with the lab results as soon as they are available. The fastest way to get your results is to activate your My Chart account. Instructions are located on the last page of this paperwork. If you have not heard from [place] regarding the results in 2 weeks, please contact this office.

## 2015-11-13 LAB — HIV ANTIBODY (ROUTINE TESTING W REFLEX): HIV: NONREACTIVE

## 2015-11-13 LAB — PSA: PSA: 0.43 ng/mL (ref <=4.00)

## 2015-11-27 ENCOUNTER — Encounter: Payer: Self-pay | Admitting: Family Medicine

## 2015-12-01 ENCOUNTER — Ambulatory Visit (INDEPENDENT_AMBULATORY_CARE_PROVIDER_SITE_OTHER): Payer: BC Managed Care – PPO | Admitting: Family Medicine

## 2015-12-01 VITALS — BP 138/88 | HR 74 | Temp 98.6°F | Resp 17 | Ht 70.5 in | Wt 224.0 lb

## 2015-12-01 DIAGNOSIS — R21 Rash and other nonspecific skin eruption: Secondary | ICD-10-CM

## 2015-12-01 LAB — CBC WITH DIFFERENTIAL/PLATELET
BASOS PCT: 0 %
Basophils Absolute: 0 cells/uL (ref 0–200)
EOS ABS: 59 {cells}/uL (ref 15–500)
Eosinophils Relative: 1 %
HEMATOCRIT: 44.5 % (ref 38.5–50.0)
Hemoglobin: 14.3 g/dL (ref 13.2–17.1)
Lymphocytes Relative: 33 %
Lymphs Abs: 1947 cells/uL (ref 850–3900)
MCH: 28.6 pg (ref 27.0–33.0)
MCHC: 32.1 g/dL (ref 32.0–36.0)
MCV: 89 fL (ref 80.0–100.0)
MONO ABS: 531 {cells}/uL (ref 200–950)
MONOS PCT: 9 %
MPV: 9.6 fL (ref 7.5–12.5)
NEUTROS ABS: 3363 {cells}/uL (ref 1500–7800)
Neutrophils Relative %: 57 %
PLATELETS: 229 10*3/uL (ref 140–400)
RBC: 5 MIL/uL (ref 4.20–5.80)
RDW: 13.4 % (ref 11.0–15.0)
WBC: 5.9 10*3/uL (ref 3.8–10.8)

## 2015-12-01 LAB — C-REACTIVE PROTEIN

## 2015-12-01 LAB — HEPATIC FUNCTION PANEL
ALT: 59 U/L — AB (ref 9–46)
AST: 54 U/L — AB (ref 10–35)
Albumin: 4.2 g/dL (ref 3.6–5.1)
Alkaline Phosphatase: 46 U/L (ref 40–115)
BILIRUBIN DIRECT: 0.1 mg/dL (ref <=0.2)
BILIRUBIN INDIRECT: 0.4 mg/dL (ref 0.2–1.2)
Total Bilirubin: 0.5 mg/dL (ref 0.2–1.2)
Total Protein: 7.3 g/dL (ref 6.1–8.1)

## 2015-12-01 LAB — POCT SEDIMENTATION RATE: POCT SED RATE: 10 mm/hr (ref 0–22)

## 2015-12-01 NOTE — Progress Notes (Signed)
By signing my name below I, Tereasa Coop, attest that this documentation has been prepared under the direction and in the presence of [name], MD. Electonically Signed. Tereasa Coop, Scribe 12/01/2015 at 10:05 AM  Subjective:    Patient ID: Alejandro [name], male    DOB: May 13, 1961, 55 y.o.   MRN: [id number]  Chief Complaint  Patient presents with  . Rash    in armpit     HPI Alejandro [name] is a 55 y.o. male who presents to the [facility] complaining of left armpit rash that was first noticed yesterday. Rash improved on its own yesterday, then returned today. Pt reports having similar rash in his left armpit multiple times throughout the years. Rash is pruritic and not painful.  Pt reports that he served in [employer] and was told that there was chemical contaminates in the water at [facility] in [place] and is concerned that the rash is a result of being exposed to the contaminates.  Pt's DM is well controlled.   Patient Active Problem List   Diagnosis Date Noted  . Diabetes ([place]) 09/14/2014  . Gallstones 09/14/2014  . Chronic hepatitis C virus infection ([place]) 12/01/2008  . ANEMIA, IRON DEFICIENCY 12/01/2008  . BLOOD IN STOOL 12/01/2008   Current Outpatient Prescriptions on File Prior to Visit  Medication Sig Dispense Refill  . glucose blood (ONE TOUCH ULTRA TEST) test strip Check blood sugar twice a day. 200 each 1  . lisinopril-hydrochlorothiazide (PRINZIDE,ZESTORETIC) 20-25 MG per tablet Take 1 tablet by mouth daily. 90 tablet 1  . metFORMIN (GLUCOPHAGE) 1000 MG tablet Take 1 tablet (1,000 mg total) by mouth 2 (two) times daily with a meal. 180 tablet 3  . [DISCONTINUED] atorvastatin (LIPITOR) 20 MG tablet Take 1 tablet (20 mg total) by mouth daily. (Patient not taking: Reported on 09/14/2014) 90 tablet 3  . [DISCONTINUED] lansoprazole (PREVACID) 30 MG capsule Take 1 capsule (30 mg total) by mouth daily at 12 noon. (Patient not taking: Reported on  09/14/2014) 30 capsule 5   No current facility-administered medications on file prior to visit.   Allergies  Allergen Reactions  . Tylenol [Acetaminophen] Hypertension   Depression screen [facility] 2/9 12/01/2015 11/12/2015 08/26/2015 06/30/2015 03/27/2015  Decreased Interest 0 0 0 0 0  Down, Depressed, Hopeless 0 0 0 0 0  PHQ - 2 Score 0 0 0 0 0      Review of Systems  Constitutional: Negative for fever.  HENT: Negative for congestion.   Eyes: Negative for visual disturbance.  Respiratory: Negative for cough.   Cardiovascular: Negative for chest pain.  Gastrointestinal: Negative for abdominal pain.  Genitourinary: Negative for dysuria.  Musculoskeletal: Negative for back pain.  Skin: Positive for rash (left armpit).  Neurological: Negative for headaches.  Psychiatric/Behavioral: Negative for agitation.       Objective:  BP 138/88 mmHg  [name] 74  Temp(Src) 98.6 F (37 C) (Oral)  Resp 17  Ht 5' 10.5" (1.791 m)  Wt 224 lb (101.606 kg)  BMI 31.68 kg/m2  SpO2 100%  Physical Exam  Constitutional: He is oriented to person, place, and time. He appears well-developed and well-nourished. No distress.  HENT:  Head: Normocephalic and atraumatic.  Eyes: Conjunctivae are normal. Pupils are equal, round, and reactive to light.  Neck: Neck supple.  Cardiovascular: Normal rate.   Pulmonary/Chest: Effort normal.  Musculoskeletal: Normal range of motion.  Lymphadenopathy:    He has no axillary adenopathy.  Neurological: He is alert  and oriented to person, place, and time.  Skin: Skin is warm and dry.  Pt has an area of non-blanching erythematous papule with hyperpigmentation in his left anterior axilla. Macule is not florescent under black light. Pt has no florescence under woods lamp on his trunk or extremities Pt has 59m line of excoriation on his flexor surface of his upper arm.  Psychiatric: He has a normal mood and affect. His behavior is normal.  Nursing note and vitals reviewed.       Assessment & Plan:  According to [employer] people who [facility] [employer] [employer] [facility], were exposed to cesium-137 and cobalt-60, agent orange and agent blue, and Airborne polychlorinated biphenyls. [facility] as I really don't know anything about these chemicals. I suspect his rash might be a fixed drug reaction but will be difficult to diagnose since he has not been able to have any provider, let alone dermatologist ,examine rash due to its sporadic occurrence and rapidity of self-limited resolution - try to come into office during flair or at least take pic with phone.  Call for derm referral or RTC for biopsy if continues. 1. Rash and nonspecific skin eruption     Orders Placed This Encounter  Procedures  . CBC with Differential/Platelet  . C-reactive protein  . Hepatic Function Panel  . POCT SEDIMENTATION RATE     I personally performed the services described in this documentation, which was scribed in my presence. The recorded information has been reviewed and considered, and addended by me as needed.   EDelman Cheadle M.D.  [facility] [place] [phone] ([phone]  12/23/2015 6:36 AM   Results for orders placed or performed in visit on 12/01/15  CBC with Differential/Platelet  Result Value Ref Range   WBC 5.9 3.8 - 10.8 K/uL   RBC 5.00 4.20 - 5.80 MIL/uL   Hemoglobin 14.3 13.2 - 17.1 g/dL   HCT 44.5 38.5 - 50.0 %   MCV 89.0 80.0 - 100.0 fL   MCH 28.6 27.0 - 33.0 pg   MCHC 32.1 32.0 - 36.0 g/dL   RDW 13.4 11.0 - 15.0 %   Platelets 229 140 - 400 K/uL   MPV 9.6 7.5 - 12.5 fL   Neutro Abs 3363 1500 - 7800 cells/uL   Lymphs Abs 1947 850 - 3900 cells/uL   Monocytes Absolute 531 200 - 950 cells/uL   Eosinophils Absolute 59 15 - 500 cells/uL   Basophils Absolute 0 0 - 200 cells/uL   Neutrophils Relative % 57 %   Lymphocytes  Relative 33 %   Monocytes Relative 9 %   Eosinophils Relative 1 %   Basophils Relative 0 %   Smear Review Criteria for review not met   C-reactive protein  Result Value Ref Range   CRP <0.5 <0.60 mg/dL  Hepatic Function Panel  Result Value Ref Range   Total Bilirubin 0.5 0.2 - 1.2 mg/dL   Bilirubin, Direct 0.1 <=0.2 mg/dL   Indirect Bilirubin 0.4 0.2 - 1.2 mg/dL   Alkaline Phosphatase 46 40 - 115 U/L   AST 54 (H) 10 - 35 U/L   ALT 59 (H) 9 - 46 U/L   Total Protein 7.3 6.1 - 8.1 g/dL   Albumin 4.2 3.6 - 5.1 g/dL  POCT SEDIMENTATION RATE  Result Value Ref Range   POCT SED RATE 10 0 -  22 mm/hr

## 2015-12-01 NOTE — Patient Instructions (Addendum)
I don't know if there is a test to show if this is related to your exposures from your service.  It is possible that this could be a "fixed drug reaction" rash but might be difficult to prove or disprove since it comes on so sporadically and resolves quickly on its own.  Trying to get into the office when it flairs up and taking a picture maybe helpful.  You may need a biopsy and/or a dermatology visit (but if you don't have the rash may be difficult for them to tell you to much more.)   IF you received an x-ray today, you will receive an invoice from [facility]. Please contact [facility] at [phone] with questions or concerns regarding your invoice.   IF you received labwork today, you will receive an invoice from [facility] [employer]. Please contact [facility] at [phone] with questions or concerns regarding your invoice.   Our billing staff will not be able to assist you with questions regarding bills from these companies.  You will be contacted with the lab results as soon as they are available. The fastest way to get your results is to activate your My Chart account. Instructions are located on the last page of this paperwork. If you have not heard from [place] regarding the results in 2 weeks, please contact this office.     Drug Rash A drug rash is a change in the color or texture of the skin that is caused by a drug. It can develop minutes, hours, or days after the person takes the drug. CAUSES This condition is usually caused by a drug allergy. It can also be caused by exposure to sunlight after taking a drug that makes the skin sensitive to light. Drugs that commonly cause rashes include:  Penicillin.  Antibiotic medicines.  Medicines that treat seizures.  Medicines that treat cancer (chemotherapy).  Aspirin and other nonsteroidal anti-inflammatory drugs (NSAIDs).  Injectable dyes that contain iodine.  Insulin. SYMPTOMS Symptoms of this  condition include:  Redness.  Tiny bumps.  Peeling.  Itching.  Itchy welts (hives).  Swelling. The rash may appear on a small area of skin or all over the body. DIAGNOSIS To diagnose the condition, your health care provider will do a physical exam. He or she may also order tests to find out which drug caused the rash. Tests to find the cause of a rash include:  Skin tests.  Blood tests.  Drug challenge. For this test, you stop taking all of the drugs that you do not need to take, and then you start taking them again by adding back one of the drugs at a time. TREATMENT A drug rash may be treated with medicines, including:  Antihistamines. These may be given to relieve itching.  An NSAID. This may be given to reduce swelling and treat pain.  A steroid drug. This may be given to reduce swelling. The rash usually goes away when the person stops taking the drug that caused it. HOME CARE INSTRUCTIONS  Take medicines only as directed by your health care provider.  Let all of your health care providers know about any drug reactions you have had in the past.  If you have hives, take a cool shower or use a cool compress to relieve itchiness. SEEK MEDICAL CARE IF:  You have a fever.  Your rash is not going away.  Your rash gets worse.  Your rash comes back.  You have wheezing or coughing. [place]  IF:  You start to have breathing problems.  You start to have shortness of breath.  You face or throat starts to swell.  You have severe weakness with dizziness or fainting.  You have chest pain.   This information is not intended to replace advice given to you by your health care provider. Make sure you discuss any questions you have with your health care provider.   Document Released: 08/18/2004 Document Revised: 08/01/2014 Document Reviewed: 05/07/2014 Elsevier Interactive Patient Education [employer].

## 2015-12-06 ENCOUNTER — Encounter: Payer: Self-pay | Admitting: Family Medicine

## 2015-12-22 ENCOUNTER — Ambulatory Visit (AMBULATORY_SURGERY_CENTER): Payer: Self-pay | Admitting: *Deleted

## 2015-12-22 ENCOUNTER — Encounter: Payer: Self-pay | Admitting: Gastroenterology

## 2015-12-22 VITALS — Ht 70.0 in | Wt 225.6 lb

## 2015-12-22 DIAGNOSIS — Z1211 Encounter for screening for malignant neoplasm of colon: Secondary | ICD-10-CM

## 2015-12-22 MED ORDER — NA SULFATE-K SULFATE-MG SULF 17.5-3.13-1.6 GM/177ML PO SOLN: ORAL | Status: DC

## 2015-12-22 NOTE — Progress Notes (Signed)
No egg or soy allergy  No diet pills taken  No home oxygen use  Pt has never had surgery but denies difficulty in moving neck

## 2015-12-24 DIAGNOSIS — D126 Benign neoplasm of colon, unspecified: Secondary | ICD-10-CM

## 2015-12-24 HISTORY — DX: Benign neoplasm of colon, unspecified: D12.6

## 2015-12-25 ENCOUNTER — Ambulatory Visit: Payer: BC Managed Care – PPO | Admitting: Podiatry

## 2015-12-31 ENCOUNTER — Ambulatory Visit (AMBULATORY_SURGERY_CENTER): Payer: BC Managed Care – PPO | Admitting: Gastroenterology

## 2015-12-31 ENCOUNTER — Encounter: Payer: Self-pay | Admitting: Gastroenterology

## 2015-12-31 VITALS — BP 128/70 | HR 65 | Temp 98.6°F | Resp 15 | Ht 70.0 in | Wt 225.0 lb

## 2015-12-31 DIAGNOSIS — D122 Benign neoplasm of ascending colon: Secondary | ICD-10-CM | POA: Diagnosis not present

## 2015-12-31 DIAGNOSIS — D124 Benign neoplasm of descending colon: Secondary | ICD-10-CM

## 2015-12-31 DIAGNOSIS — Z1211 Encounter for screening for malignant neoplasm of colon: Secondary | ICD-10-CM

## 2015-12-31 DIAGNOSIS — D123 Benign neoplasm of transverse colon: Secondary | ICD-10-CM

## 2015-12-31 LAB — GLUCOSE, CAPILLARY
Glucose-Capillary: 80 mg/dL (ref 65–99)
Glucose-Capillary: 134 mg/dL — ABNORMAL HIGH (ref 65–99)

## 2015-12-31 MED ORDER — SODIUM CHLORIDE 0.9 % IV SOLN: 500.0000 mL | INTRAVENOUS | Status: DC

## 2015-12-31 NOTE — Progress Notes (Signed)
Called to room to assist during endoscopic procedure.  Patient ID and intended procedure confirmed with present staff. Received instructions for my participation in the procedure from the performing physician.  

## 2015-12-31 NOTE — Progress Notes (Signed)
Report to PACU, RN, vss, BBS= Clear.  

## 2015-12-31 NOTE — Patient Instructions (Signed)
YOU HAD AN ENDOSCOPIC PROCEDURE TODAY AT THE [facility]:   Refer to the procedure report that was given to you for any specific questions about what was found during the examination.  If the procedure report does not answer your questions, please call your gastroenterologist to clarify.  If you requested that your care partner not be given the details of your procedure findings, then the procedure report has been included in a sealed envelope for you to review at your convenience later.  YOU SHOULD EXPECT: Some feelings of bloating in the abdomen. Passage of more gas than usual.  Walking can help get rid of the air that was put into your GI tract during the procedure and reduce the bloating. If you had a lower endoscopy (such as a colonoscopy or flexible sigmoidoscopy) you may notice spotting of blood in your stool or on the toilet paper. If you underwent a bowel prep for your procedure, you may not have a normal bowel movement for a few days.  Please Note:  You might notice some irritation and congestion in your nose or some drainage.  This is from the oxygen used during your procedure.  There is no need for concern and it should clear up in a day or so.  SYMPTOMS TO REPORT IMMEDIATELY:   Following lower endoscopy (colonoscopy or flexible sigmoidoscopy):  Excessive amounts of blood in the stool  Significant tenderness or worsening of abdominal pains  Swelling of the abdomen that is new, acute  Fever of 100F or higher    For urgent or emergent issues, a gastroenterologist can be reached at any hour by calling [phone].   DIET: Your first meal following the procedure should be a small meal and then it is ok to progress to your normal diet. Heavy or fried foods are harder to digest and may make you feel nauseous or bloated.  Likewise, meals heavy in dairy and vegetables can increase bloating.  Drink plenty of fluids but you should avoid alcoholic beverages for 24  hours.  ACTIVITY:  You should plan to take it easy for the rest of today and you should NOT DRIVE or use heavy machinery until tomorrow (because of the sedation medicines used during the test).    FOLLOW UP: Our staff will call the number listed on your records the next business day following your procedure to check on you and address any questions or concerns that you may have regarding the information given to you following your procedure. If we do not reach you, we will leave a message.  However, if you are feeling well and you are not experiencing any problems, there is no need to return our call.  We will assume that you have returned to your regular daily activities without incident.  If any biopsies were taken you will be contacted by phone or by letter within the next 1-3 weeks.  Please call us at [phone] if you have not heard about the biopsies in 3 weeks.    SIGNATURES/CONFIDENTIALITY: You and/or your care partner have signed paperwork which will be entered into your electronic medical record.  These signatures attest to the fact that that the information above on your After Visit Summary has been reviewed and is understood.  Full responsibility of the confidentiality of this discharge information lies with you and/or your care-partner.   Resume medications. Information given on polyps,diverticulosis,hemorrhoids and high fiber diet. 

## 2015-12-31 NOTE — Op Note (Signed)
[place] Patient Name: Alejandro [name] Procedure Date: 12/31/2015 10:20 AM MRN: [id number] Endoscopist: Ladene Artist , MD Age: 55 Referring MD:  Date of Birth: 1961/05/13 Gender: Male Procedure:                Colonoscopy Indications:              Screening for colorectal malignant neoplasm Medicines:                Monitored Anesthesia Care Procedure:                Pre-Anesthesia Assessment:                           - Prior to the procedure, a History and Physical                            was performed, and patient medications and                            allergies were reviewed. The patient's tolerance of                            previous anesthesia was also reviewed. The risks                            and benefits of the procedure and the sedation                            options and risks were discussed with the patient.                            All questions were answered, and informed consent                            was obtained. Prior Anticoagulants: The patient has                            taken no previous anticoagulant or antiplatelet                            agents. ASA Grade Assessment: II - A patient with                            mild systemic disease. After reviewing the risks                            and benefits, the patient was deemed in                            satisfactory condition to undergo the procedure.                           After obtaining informed consent, the colonoscope  was passed under direct vision. Throughout the                            procedure, the patient's blood pressure, pulse, and                            oxygen saturations were monitored continuously. The                            Model PCF-H190L [id number]) scope was introduced                            through the anus and advanced to the the cecum,                            identified by appendiceal orifice and  ileocecal                            valve. The ileocecal valve, appendiceal orifice,                            and rectum were photographed. The quality of the                            bowel preparation was good. The colonoscopy was                            performed without difficulty. The patient tolerated                            the procedure well. Scope In: 10:25:17 AM Scope Out: 10:40:55 AM Scope Withdrawal Time: 0 hours 14 minutes 16 seconds  Total Procedure Duration: 0 hours 15 minutes 38 seconds  Findings:                 A 6 mm polyp was found in the descending colon. The                            polyp was sessile. The polyp was removed with a                            cold snare. Resection and retrieval were complete.                           Three sessile polyps were found in the descending                            colon, transverse colon and ascending colon. The                            polyps were 4 to 5 mm in size. These polyps were  removed with a cold forceps. Resection and                            retrieval were complete.                           Multiple medium-mouthed diverticula were found in                            the sigmoid colon and descending colon. There was                            narrowing of the colon in association with the                            diverticular opening. There was evidence of                            diverticular spasm. Erythema was seen in                            association with the diverticular opening. There                            was no evidence of diverticular bleeding.                           Internal hemorrhoids were found during                            retroflexion. The hemorrhoids were moderate and                            Grade I (internal hemorrhoids that do not                            prolapse). Small external hemorrhoids.                           The exam  was otherwise normal throughout the                            examined colon. Complications:            No immediate complications. Estimated blood loss:                            None. Estimated Blood Loss:     Estimated blood loss: none. Impression:               - One 6 mm polyp in the descending colon, removed                            with a cold snare. Resected and retrieved.                           -  Three 4 to 5 mm polyps in the descending colon,                            in the transverse colon and in the ascending colon,                            removed with a cold forceps. Resected and retrieved.                           - Moderate diverticulosis in the sigmoid colon and                            in the descending colon.                           - Internal hemorrhoids. Recommendation:           - Repeat colonoscopy in 5 years for surveillance.                           - Patient has a contact number available for                            emergencies. The signs and symptoms of potential                            delayed complications were discussed with the                            patient. Return to normal activities tomorrow.                            Written discharge instructions were provided to the                            patient.                           - Resume previous diet.                           - Continue present medications.                           - Await pathology results. Ladene Artist, MD 12/31/2015 10:48:31 AM This report has been signed electronically.

## 2016-01-01 ENCOUNTER — Telehealth: Payer: Self-pay | Admitting: *Deleted

## 2016-01-01 NOTE — Telephone Encounter (Signed)
  Follow up Call-  Call back number 12/31/2015  Post procedure Call Back phone  # [phone]  Permission to leave phone message Yes     Patient questions:  Do you have a fever, pain , or abdominal swelling? No. Pain Score  0 *  Have you tolerated food without any problems? Yes.    Have you been able to return to your normal activities? Yes.    Do you have any questions about your discharge instructions: Diet   No. Medications  No. Follow up visit  No.  Do you have questions or concerns about your Care? No.  Actions: * If pain score is 4 or above: No action needed, pain <4.

## 2016-01-05 ENCOUNTER — Encounter: Payer: Self-pay | Admitting: Gastroenterology

## 2016-01-08 ENCOUNTER — Telehealth: Payer: Self-pay

## 2016-01-08 NOTE — Telephone Encounter (Signed)
Patient dropped off FMLA form to be completed by Dr Tamala Julian. I called the patient to find out what exactly he needed this for since his last OV was for a skin rash. He stated that he is having trouble getting up in the morning and getting started with his day, states he feels drained and he is associating this with his Type 2 diabetes mellitus with other diabetic kidney complication, without long-term current use of insulin ([employer]) . I told him I wasn't sure that he would be able to get this form completed without coming in an being seen again, but I told him I would give it to the doctor to determine this. I will place them in your box on 01/08/16 if you could please let me know something or complete them within 5-7 business days. Thank you!

## 2016-01-08 NOTE — Telephone Encounter (Signed)
Call ---- patient's diabetes is under excellent control; I can only complete FMLA paperwork to support him attending office appointments once every three months; each appointment will require 4 hours of missed work every 3 months.  He should NOT feel drained from diabetes.

## 2016-01-20 NOTE — Telephone Encounter (Signed)
Paperwork scanned and called patient to come pick up on 01/20/16

## 2016-02-01 DIAGNOSIS — Z0271 Encounter for disability determination: Secondary | ICD-10-CM

## 2016-02-08 ENCOUNTER — Other Ambulatory Visit: Payer: Self-pay

## 2016-02-08 MED ORDER — LISINOPRIL-HYDROCHLOROTHIAZIDE 20-25 MG PO TABS: 1.0000 | ORAL_TABLET | Freq: Every day | ORAL | Status: DC

## 2016-02-08 NOTE — Telephone Encounter (Signed)
Lisinopril.[place] 20-25mg  #90 requested.  Needs OV - refilled #30 with note to pharmacy

## 2016-04-01 ENCOUNTER — Other Ambulatory Visit: Payer: Self-pay | Admitting: Family Medicine

## 2016-04-01 ENCOUNTER — Ambulatory Visit (INDEPENDENT_AMBULATORY_CARE_PROVIDER_SITE_OTHER): Payer: BC Managed Care – PPO | Admitting: Physician Assistant

## 2016-04-01 VITALS — BP 142/90 | HR 82 | Temp 98.0°F | Resp 18 | Ht 69.0 in | Wt 212.2 lb

## 2016-04-01 DIAGNOSIS — E1165 Type 2 diabetes mellitus with hyperglycemia: Secondary | ICD-10-CM

## 2016-04-01 DIAGNOSIS — E119 Type 2 diabetes mellitus without complications: Secondary | ICD-10-CM

## 2016-04-01 LAB — COMPLETE METABOLIC PANEL WITH GFR
ALBUMIN: 4.6 g/dL (ref 3.6–5.1)
ALK PHOS: 46 U/L (ref 40–115)
ALT: 70 U/L — AB (ref 9–46)
AST: 61 U/L — AB (ref 10–35)
BILIRUBIN TOTAL: 0.5 mg/dL (ref 0.2–1.2)
BUN: 15 mg/dL (ref 7–25)
CO2: 27 mmol/L (ref 20–31)
CREATININE: 1.08 mg/dL (ref 0.70–1.33)
Calcium: 9.9 mg/dL (ref 8.6–10.3)
Chloride: 99 mmol/L (ref 98–110)
GFR, Est African American: 89 mL/min (ref >=60)
GFR, Est Non African American: 77 mL/min (ref >=60)
GLUCOSE: 88 mg/dL (ref 65–99)
Potassium: 4.2 mmol/L (ref 3.5–5.3)
SODIUM: 136 mmol/L (ref 135–146)
TOTAL PROTEIN: 7.7 g/dL (ref 6.1–8.1)

## 2016-04-01 MED ORDER — BLOOD GLUCOSE MONITORING SUPPL KIT: 1.0000 "application " | PACK | 3 refills | Status: DC

## 2016-04-01 MED ORDER — METFORMIN HCL 1000 MG PO TABS: 1000.0000 mg | ORAL_TABLET | Freq: Two times a day (BID) | ORAL | 3 refills | Status: DC

## 2016-04-01 NOTE — Progress Notes (Addendum)
Patient ID: Alejandro [name], male   DOB: 31-Dec-1960, 55 y.o.   MRN: [id number] [facility] [address] [place] [address] [phone]  By signing my name below, I, Essence Howell, attest that this documentation has been prepared under the direction and in the presence of Ivar Drape, PA-C Electronically Signed: Ladene Artist, ED Scribe 04/01/2016 at 4:20 PM.  Date:  04/01/2016   Name:  Alejandro [name]   DOB:  11/27/1960   MRN:  [id number]  PCP:  Reginia Forts, MD   Chief Complaint  Patient presents with   Medication Refill    glucose blood test strip   History of Present Illness:  Alejandro [name] is a 55 y.o. male patient, with a h/o DM, who presents to [facility] for a medication refill of glucose blood test strips. Pt states that he ran out of Metformin 2 weeks ago. He also states that he went to visit his family in [place] 3 weeks ago and lost his glucose monitoring kit in [place]. He states that his blood glucose ranges from 90-150. Pt reports an associated symptom of tremors in his hands. He denies visual disturbances, nausea, polydipsia, chest pain, palpitations, sob, leg swelling.  Patient Active Problem List   Diagnosis Date Noted   Diabetes ([place]) 09/14/2014   Gallstones 09/14/2014   Chronic hepatitis C virus infection ([place]) 12/01/2008   ANEMIA, IRON DEFICIENCY 12/01/2008   BLOOD IN STOOL 12/01/2008    Past Medical History:  Diagnosis Date   Diabetes mellitus without complication ([place])    Hepatitis C    Hypertension     Past Surgical History:  Procedure Laterality Date   COLONOSCOPY      Social History  Substance Use Topics   Smoking status: Former Smoker   Smokeless tobacco: Never Used   Alcohol use Yes     Comment: occasional wine    Family History  Problem Relation Age of Onset   Heart disease Mother 15    CAD/AMI   Diabetes Mother    Hypertension Mother    Diabetes Maternal Grandfather    Diabetes  Paternal Grandfather    Diabetes Father    Diabetes Sister    Hypertension Sister    Hypertension Brother    Diabetes Brother    Colon cancer Neg Hx    Esophageal cancer Neg Hx    Stomach cancer Neg Hx    Rectal cancer Neg Hx     Allergies  Allergen Reactions   Tylenol [Acetaminophen] Hypertension    Medication list has been reviewed and updated.  Current Outpatient Prescriptions on File Prior to Visit  Medication Sig Dispense Refill   glucose blood (ONE TOUCH ULTRA TEST) test strip Check blood sugar twice a day. 200 each 1   lisinopril-hydrochlorothiazide (PRINZIDE,ZESTORETIC) 20-25 MG tablet Take 1 tablet by mouth daily. 30 tablet 0   metFORMIN (GLUCOPHAGE) 1000 MG tablet Take 1 tablet (1,000 mg total) by mouth 2 (two) times daily with a meal. 180 tablet 3   [DISCONTINUED] atorvastatin (LIPITOR) 20 MG tablet Take 1 tablet (20 mg total) by mouth daily. (Patient not taking: Reported on 09/14/2014) 90 tablet 3   [DISCONTINUED] lansoprazole (PREVACID) 30 MG capsule Take 1 capsule (30 mg total) by mouth daily at 12 noon. (Patient not taking: Reported on 09/14/2014) 30 capsule 5   No current facility-administered medications on file prior to visit.     Review of Systems  Eyes: Negative for blurred vision and double vision.  Respiratory: Negative for shortness of breath.   Cardiovascular: Negative for chest pain, palpitations and leg swelling.  Gastrointestinal: Negative for nausea.  Neurological: Positive for tremors.  Endo/Heme/Allergies: Negative for polydipsia.    Physical Examination: BP (!) 142/90 (BP Location: Right Arm, Patient Position: Sitting, Cuff Size: Large)    Pulse 82    Temp 98 F (36.7 C) (Oral)    Resp 18    Ht 5' 9"  (1.753 m)    Wt 212 lb 3.2 oz (96.3 kg)    SpO2 100%    BMI 31.34 kg/m  Ideal Body Weight: @FLOWAMB ([id number])@  Physical Exam  Constitutional: He is oriented to person, place, and time. He appears well-developed and  well-nourished. No distress.  HENT:  Head: Normocephalic and atraumatic.  Eyes: Conjunctivae and EOM are normal. Pupils are equal, round, and reactive to light.  Cardiovascular: Normal rate, regular rhythm, normal heart sounds and normal pulses.  Exam reveals no gallop and no friction rub.   No murmur heard. Pulses:      Dorsalis pedis pulses are 2+ on the right side, and 2+ on the left side.  Pulmonary/Chest: Effort normal and breath sounds normal. No respiratory distress.  Feet:  Right Foot:  Protective Sensation: 6 sites tested. 6 sites sensed.  Skin Integrity: Negative for ulcer or skin breakdown.  Left Foot:  Protective Sensation: 6 sites tested. 6 sites sensed.  Skin Integrity: Negative for ulcer or skin breakdown.  Neurological: He is alert and oriented to person, place, and time.  Skin: Skin is warm and dry. He is not diaphoretic.  Psychiatric: He has a normal mood and affect. His behavior is normal.    Assessment and Plan: Alejandro [name] is a 55 y.o. male who is here today for medication refill and test strips. Foot exam performed. Advised to restartis metformin and glucose monitor ordered today.   Type 2 diabetes mellitus with hyperglycemia, without long-term current use of insulin ([place]) - Plan: Blood Glucose Monitoring Suppl KIT, COMPLETE METABOLIC PANEL WITH GFR, Hemoglobin A1c, Care order/instruction:Plan: metFORMIN (GLUCOPHAGE) 1000 MG tablet   Ivar Drape, PA-C [facility] 04/01/2016 4:20 PM

## 2016-04-01 NOTE — Patient Instructions (Addendum)
  I will contact you within 7-10 days with lab results.  Please check blood sugar every morning and when symptomatic.    IF you received an x-ray today, you will receive an invoice from [facility]. Please contact [facility] at [phone] with questions or concerns regarding your invoice.   IF you received labwork today, you will receive an invoice from [employer]. Please contact [facility] at [phone] with questions or concerns regarding your invoice.   Our billing staff will not be able to assist you with questions regarding bills from these companies.  You will be contacted with the lab results as soon as they are available. The fastest way to get your results is to activate your My Chart account. Instructions are located on the last page of this paperwork. If you have not heard from [place] regarding the results in 2 weeks, please contact this office.

## 2016-04-02 LAB — HEMOGLOBIN A1C
Hgb A1c MFr Bld: 5.9 % — ABNORMAL HIGH (ref <5.7)
Mean Plasma Glucose: 123 mg/dL

## 2016-04-13 ENCOUNTER — Other Ambulatory Visit: Payer: Self-pay

## 2016-04-13 MED ORDER — LISINOPRIL-HYDROCHLOROTHIAZIDE 20-25 MG PO TABS: 1.0000 | ORAL_TABLET | Freq: Every day | ORAL | 0 refills | Status: DC

## 2016-04-20 ENCOUNTER — Telehealth: Payer: Self-pay

## 2016-04-20 NOTE — Telephone Encounter (Signed)
Pt has not heard anything back about his lab results.  Its been since 04-01-16.  Also when we prescribed his test strips, he said it changed from 2 times per day to one.  Is this correct?  Call his home at [phone]

## 2016-04-22 ENCOUNTER — Telehealth: Payer: Self-pay | Admitting: Emergency Medicine

## 2016-04-22 ENCOUNTER — Encounter: Payer: Self-pay | Admitting: Emergency Medicine

## 2016-04-22 ENCOUNTER — Other Ambulatory Visit: Payer: Self-pay | Admitting: Emergency Medicine

## 2016-04-22 MED ORDER — GLUCOSE BLOOD VI STRP: ORAL_STRIP | 4 refills | Status: DC

## 2016-04-22 NOTE — Telephone Encounter (Signed)
Pt given lab results A1C has improved Encouraged to continue watching intake of artificial drinks /sugars and exercise Also instructed to monitor fatty food due to liver test elevation

## 2016-06-02 ENCOUNTER — Telehealth: Payer: Self-pay

## 2016-06-02 MED ORDER — GLUCOSE BLOOD VI STRP: ORAL_STRIP | 4 refills | Status: DC

## 2016-06-02 NOTE — Telephone Encounter (Signed)
Pt called to say that his pharm only gave him one #50 box of strips and he normally gets enough to test BID for 90 days. I advised that the last Rx written by [place] on 9/29 is for #200 w/ 4 RFs. Pt stated pharm said he needed to call us. Called pharm who explained that they filled the Rx for the Verio strips that pt stated he was using written by Collet[place] and it was only for once daily. I sent in a new Rx for the Verio strips for BID testing because pt is used to testing BID and stated he is really trying to get his BS under control. Also answered many questions for pt concerning testing, DM Sxs and treatment.

## 2016-06-25 ENCOUNTER — Ambulatory Visit (INDEPENDENT_AMBULATORY_CARE_PROVIDER_SITE_OTHER): Payer: BC Managed Care – PPO | Admitting: Osteopathic Medicine

## 2016-06-25 VITALS — BP 132/88 | HR 86 | Temp 98.2°F | Resp 16 | Ht 71.0 in | Wt 209.0 lb

## 2016-06-25 DIAGNOSIS — E119 Type 2 diabetes mellitus without complications: Secondary | ICD-10-CM

## 2016-06-25 DIAGNOSIS — E118 Type 2 diabetes mellitus with unspecified complications: Secondary | ICD-10-CM

## 2016-06-25 DIAGNOSIS — R609 Edema, unspecified: Secondary | ICD-10-CM | POA: Diagnosis not present

## 2016-06-25 DIAGNOSIS — I10 Essential (primary) hypertension: Secondary | ICD-10-CM

## 2016-06-25 MED ORDER — BLOOD PRESSURE CUFF MISC: 0 refills | Status: DC

## 2016-06-25 NOTE — Patient Instructions (Addendum)
Fasting glucose levels between 80-120 is the goal. You do not have to check sugars multiple times per day or even daily.    IF you received an x-ray today, you will receive an invoice from [facility]. Please contact [facility] at [phone] with questions or concerns regarding your invoice.   IF you received labwork today, you will receive an invoice from [employer]. Please contact [facility] at [phone] with questions or concerns regarding your invoice.   Our billing staff will not be able to assist you with questions regarding bills from these companies.  You will be contacted with the lab results as soon as they are available. The fastest way to get your results is to activate your My Chart account. Instructions are located on the last page of this paperwork. If you have not heard from [place] regarding the results in 2 weeks, please contact this office.

## 2016-06-25 NOTE — Progress Notes (Signed)
HPI: Alejandro [name] is a 55 y.o. male  who presents to [facility], 06/25/16,  for chief complaint of:  Chief Complaint  Patient presents with  . Diabetes check    DM2: Pt here to follow-up on DM2. He thought he was supposed to follow-up every 2 months. Last A1C at goal. No other complaints today. Has some questions about glucose goals, if he can get a BP cuff Rx. Requests printout of labs.   HTN: controlled, no CP/SOB  Trace LE edema: no CO/SOB, no orthopnea, no hx CHF.     Past medical, surgical, social and family history reviewed: Past Medical History:  Diagnosis Date  . Diabetes mellitus without complication ([place])   . Hepatitis C   . Hypertension    Past Surgical History:  Procedure Laterality Date  . COLONOSCOPY     Social History  Substance Use Topics  . Smoking status: Former Research scientist (life sciences)  . Smokeless tobacco: Never Used  . Alcohol use Yes     Comment: occasional wine   Family History  Problem Relation Age of Onset  . Heart disease Mother 26    CAD/AMI  . Diabetes Mother   . Hypertension Mother   . Diabetes Maternal Grandfather   . Diabetes Paternal Grandfather   . Diabetes Father   . Diabetes Sister   . Hypertension Sister   . Hypertension Brother   . Diabetes Brother   . Colon cancer Neg Hx   . Esophageal cancer Neg Hx   . Stomach cancer Neg Hx   . Rectal cancer Neg Hx      Current medication list and allergy/intolerance information reviewed:   Current Outpatient Prescriptions  Medication Sig Dispense Refill  . Blood Glucose Monitoring Suppl KIT 1 application by Does not apply route every morning. 100 each 3  . glucose blood (ONE TOUCH ULTRA TEST) test strip Check blood sugar twice a day. 200 each 4  . lisinopril-hydrochlorothiazide (PRINZIDE,ZESTORETIC) 20-25 MG tablet Take 1 tablet by mouth daily. 90 tablet 0  . metFORMIN (GLUCOPHAGE) 1000 MG tablet Take 1 tablet (1,000 mg total) by mouth 2 (two) times daily with a  meal. 180 tablet 3   No current facility-administered medications for this visit.    Allergies  Allergen Reactions  . Tylenol [Acetaminophen] Hypertension      Review of Systems:  Constitutional:  No  fever, no chills, No recent illness  HEENT: No  headache, no vision change  Cardiac: No  chest pain, No  pressure, No palpitations  Respiratory:  No  shortness of breath.   Gastrointestinal: No  abdominal pain, No  nausea,  Neurologic: No  weakness, No  dizziness  Exam:  BP 132/88 (BP Location: Right Arm, Patient Position: Sitting, Cuff Size: Large)   Pulse 86   Temp 98.2 F (36.8 C) (Oral)   Resp 16   Ht 5' 11" (1.803 m)   Wt 209 lb (94.8 kg)   SpO2 98%   BMI 29.15 kg/m   Constitutional: VS see above. General Appearance: alert, well-developed, well-nourished, NAD  Eyes: Normal lids and conjunctive, non-icteric sclera  Ears, Nose, Mouth, Throat: MMM, Normal external inspection ears/nares/mouth/lips/gums.   Neck: No masses, trachea midline.   Respiratory: Normal respiratory effort. no wheeze, no rhonchi, no rales  Cardiovascular: S1/S2 normal, no murmur, no rub/gallop auscultated. RRR. Trace lower extremity edema  Musculoskeletal: Gait normal. No clubbing/cyanosis of digits.   Neurological: Normal balance/coordination. No tremor.   Skin: warm, dry, intact  Psychiatric: Normal judgment/insight. Normal mood and affect. Oriented x3.   Reviewed most recent labs - liver enzymes stable. A1C at goal.    ASSESSMENT/PLAN: Continue current care. Too soon for A1C recheck and last was <6. Advised to followup with PCP next few months for routine care, looks like last annual labs were 10/2015  Type 2 diabetes mellitus without complication, without long-term current use of insulin ([place]) - ADvised does not need to check Glc multiple times per day (only on metformin), A1C not due.   DM feet ([place]) - see nurse notes for foot exam - no concerns - Plan: HM Diabetes Foot  Exam  Essential hypertension - BP cuff Rx written, advised arm cuff rather than wrist cuff  Trace edema - c/w dependent edema, no orthopnea or chest pain/SOB, no claudication     Patient Instructions   Fasting glucose levels between 80-120 is the goal. You do not have to check sugars multiple times per day or even daily.    IF you received an x-ray today, you will receive an invoice from [facility]. Please contact [facility]iology at [phone] with questions or concerns regarding your invoice.   IF you received labwork today, you will receive an invoice from [employer]. Please contact [facility] at [phone] with questions or concerns regarding your invoice.   Our billing staff will not be able to assist you with questions regarding bills from these companies.  You will be contacted with the lab results as soon as they are available. The fastest way to get your results is to activate your My Chart account. Instructions are located on the last page of this paperwork. If you have not heard from [place] regarding the results in 2 weeks, please contact this office.            Visit summary with medication list and pertinent instructions was printed for patient to review. All questions at time of visit were answered - patient instructed to contact office with any additional concerns. ER/RTC precautions were reviewed with the patient. Follow-up plan: Return in about 2 months (around 08/26/2016) for DIABETES RECHECK .  Note: Total time spent 15 minutes, greater than 50% of the visit was spent face-to-face counseling and coordinating care for the following: The primary encounter diagnosis was Type 2 diabetes mellitus without complication, without long-term current use of insulin ([place]). Diagnoses of DM feet ([place]) and Essential hypertension were also pertinent to this visit.Marland Kitchen

## 2016-08-06 ENCOUNTER — Other Ambulatory Visit: Payer: Self-pay | Admitting: Physician Assistant

## 2016-08-13 ENCOUNTER — Ambulatory Visit (INDEPENDENT_AMBULATORY_CARE_PROVIDER_SITE_OTHER): Payer: BC Managed Care – PPO | Admitting: Family Medicine

## 2016-08-13 VITALS — BP 142/90 | HR 81 | Temp 98.2°F | Resp 18 | Ht 71.0 in | Wt 210.0 lb

## 2016-08-13 DIAGNOSIS — E119 Type 2 diabetes mellitus without complications: Secondary | ICD-10-CM

## 2016-08-13 DIAGNOSIS — Z5181 Encounter for therapeutic drug level monitoring: Secondary | ICD-10-CM

## 2016-08-13 DIAGNOSIS — E1165 Type 2 diabetes mellitus with hyperglycemia: Secondary | ICD-10-CM | POA: Diagnosis not present

## 2016-08-13 DIAGNOSIS — B070 Plantar wart: Secondary | ICD-10-CM

## 2016-08-13 DIAGNOSIS — R740 Nonspecific elevation of levels of transaminase and lactic acid dehydrogenase [LDH]: Secondary | ICD-10-CM

## 2016-08-13 DIAGNOSIS — H6503 Acute serous otitis media, bilateral: Secondary | ICD-10-CM | POA: Diagnosis not present

## 2016-08-13 DIAGNOSIS — R7401 Elevation of levels of liver transaminase levels: Secondary | ICD-10-CM

## 2016-08-13 DIAGNOSIS — B182 Chronic viral hepatitis C: Secondary | ICD-10-CM

## 2016-08-13 LAB — POCT GLYCOSYLATED HEMOGLOBIN (HGB A1C): Hemoglobin A1C: 4.9

## 2016-08-13 MED ORDER — HYDROCHLOROTHIAZIDE 25 MG PO TABS: 25.0000 mg | ORAL_TABLET | Freq: Every day | ORAL | 3 refills | Status: DC

## 2016-08-13 MED ORDER — METFORMIN HCL 500 MG PO TABS: 500.0000 mg | ORAL_TABLET | Freq: Two times a day (BID) | ORAL | 3 refills | Status: DC

## 2016-08-13 MED ORDER — PSEUDOEPHEDRINE HCL ER 120 MG PO TB12: 120.0000 mg | ORAL_TABLET | Freq: Two times a day (BID) | ORAL | 0 refills | Status: DC

## 2016-08-13 MED ORDER — LISINOPRIL 40 MG PO TABS: 40.0000 mg | ORAL_TABLET | Freq: Every day | ORAL | 3 refills | Status: DC

## 2016-08-13 MED ORDER — FLUTICASONE PROPIONATE 50 MCG/ACT NA SUSP: 2.0000 | Freq: Every day | NASAL | 2 refills | Status: DC

## 2016-08-13 MED ORDER — AMOXICILLIN 500 MG PO CAPS: 1000.0000 mg | ORAL_CAPSULE | Freq: Two times a day (BID) | ORAL | 0 refills | Status: DC

## 2016-08-13 NOTE — Patient Instructions (Addendum)
You are do for your cholesterol check at your next office visit to remember to come in [place].   If you want to make that appointment in 4 months for your full physical, that would be great.   Start flonase every day - 2sprays each side of your nose every night. This will take 2 weeks to reach full effect. Start the sudafed twice a day for about 5 days. If this is causing any chest pain, heart racing, vision change, headache, jitteriness, please stop it.  If your symptoms are not improving, you can start the oxymetalazone nasal spray (Afrin) as directed on the bottle (OTC) but do not use for longer than 3 days. If none of this is helping, start the amoxicillin antibiotic and continue on the flonase for the entire time.  Return in 2-3 weeks to recheck your ears and retreat your plantar wart.  IF you received an x-ray today, you will receive an invoice from [facility]. Please contact [facility] at [phone] with questions or concerns regarding your invoice.   IF you received labwork today, you will receive an invoice from [place]. Please contact [facility] at [phone] with questions or concerns regarding your invoice.   Our billing staff will not be able to assist you with questions regarding bills from these companies.  You will be contacted with the lab results as soon as they are available. The fastest way to get your results is to activate your My Chart account. Instructions are located on the last page of this paperwork. If you have not heard from [place] regarding the results in 2 weeks, please contact this office.     Barotitis Media Barotitis media is inflammation of your middle ear. This occurs when the auditory tube (eustachian tube) leading from the back of your nose (nasopharynx) to your eardrum is blocked. This blockage may result from a cold, environmental allergies, or an upper respiratory infection. Unresolved barotitis media may lead to damage or hearing  loss (barotrauma), which may become permanent. HOME CARE INSTRUCTIONS   Use medicines as recommended by your health care provider. Over-the-counter medicines will help unblock the canal and can help during times of air travel.  Do not put anything into your ears to clean or unplug them. Eardrops will not be helpful.  Do not swim, dive, or fly until your health care provider says it is all right to do so. If these activities are necessary, chewing gum with frequent, forceful swallowing may help. It is also helpful to hold your nose and gently blow to pop your ears for equalizing pressure changes. This forces air into the eustachian tube.  Only take over-the-counter or prescription medicines for pain, discomfort, or fever as directed by your health care provider.  A decongestant may be helpful in decongesting the middle ear and make pressure equalization easier. SEEK MEDICAL CARE IF:  You experience a serious form of dizziness in which you feel as if the room is spinning and you feel nauseated (vertigo).  Your symptoms only involve one ear. SEEK IMMEDIATE MEDICAL CARE IF:   You develop a severe headache, dizziness, or severe ear pain.  You have bloody or pus-like drainage from your ears.  You develop a fever.  Your problems do not improve or become worse. MAKE SURE YOU:   Understand these instructions.  Will watch your condition.  Will get help right away if you are not doing well or get worse. This information is not intended to replace advice given to you  by your health care provider. Make sure you discuss any questions you have with your health care provider. Document Released: 07/08/2000 Document Revised: 05/01/2013 Document Reviewed: 02/05/2013 Elsevier Interactive Patient Education  2017 [place].  Plantar Warts Introduction Warts are small growths on the skin. They can occur on various areas of the body. When they occur on the underside (sole) of the foot, they are  called plantar warts. Plantar warts often occur in groups, with several small warts around a larger growth. They tend to develop over areas of pressure, such as the heel or the ball of the foot. Most warts are not painful, and they usually do not cause problems. However, plantar warts may cause pain when you walk because pressure is applied to them. Warts often go away on their own in time. Various treatments may be done if needed. Sometimes, warts go away and then they come back again. What are the causes? Plantar warts are caused by a type of virus that is called human papillomavirus (HPV). HPV attacks a break in the skin of the foot. Walking barefoot can lead to exposure to the virus. These warts may spread to other areas of the sole. They spread to other areas of the body only through direct contact. What increases the risk? Plantar warts are more likely to develop in:  People who are 26-49 years of age.  People who use public showers or locker rooms.  People who have a weakened body defense system (immune system). What are the signs or symptoms? Plantar warts may be flat or slightly raised. They may grow into the deeper layers of skin or rise above the surface of the skin. Most plantar warts have a rough surface. They may cause pain when you use your foot to support your body weight. How is this diagnosed? A plantar wart can usually be diagnosed from its appearance. In some cases, a tissue sample may be removed (biopsy) to be looked at under a microscope. How is this treated? In many cases, warts do not need treatment. Without treatment, they often go away over a period of many months to a couple years. If treatment is needed, options may include:  Applying medicated solutions, creams, or patches to the wart. These may be over-the-counter or prescription medicines that make the skin soft so that layers will gradually shed away. In many cases, the medicine is applied one or two times per day  and covered with a bandage.  Putting duct tape over the top of the wart (occlusion). You will leave the tape in place for as long as told by your health care provider, then you will replace it with a new strip of tape. This is done until the wart goes away.  Freezing the wart with liquid nitrogen (cryotherapy).  Burning the wart with:  Laser treatment.  An electrified probe (electrocautery).  Injection of a medicine (Candida antigen) into the wart to help the body's immune system to fight off the wart.  Surgery to remove the wart. Follow these instructions at home:  Apply medicated creams or solutions only as told by your health care provider. This may involve:  Soaking the affected area in warm water.  Removing the top layer of softened skin before you apply the medicine. A pumice stone works well for removing the tissue.  Applying a bandage over the affected area after you apply the medicine.  Repeating the process daily or as told by your health care provider.  Do not scratch or  pick at a wart.  Wash your hands after you touch a wart.  If a wart is painful, try applying a bandage with a hole in the middle over the wart. The helps to take pressure off the wart.  Keep all follow-up visits as told by your health care provider. This is important. How is this prevented? Take these actions to help prevent warts:  Wear shoes and socks. Change your socks daily.  Keep your feet clean and dry.  Check your feet regularly.  Avoid direct contact with warts on other people. Contact a health care provider if:  Your warts do not improve after treatment.  You have redness, swelling, or pain at the site of a wart.  You have bleeding from a wart that does not stop with light pressure.  You have diabetes and you develop a wart. This information is not intended to replace advice given to you by your health care provider. Make sure you discuss any questions you have with your health  care provider. Document Released: 10/01/2003 Document Revised: 12/17/2015 Document Reviewed: 10/06/2014  2017 Elsevier  Ingrown Toenail An ingrown toenail occurs when the corner or sides of your toenail grow into the surrounding skin. The big toe is most commonly affected, but it can happen to any of your toes. If your ingrown toenail is not treated, you will be at risk for infection. What are the causes? This condition may be caused by:  Wearing shoes that are too small or tight.  Injury or trauma, such as stubbing your toe or having your toe stepped on.  Improper cutting or care of your toenails.  Being born with (congenital) nail or foot abnormalities, such as having a nail that is too big for your toe. What increases the risk? Risk factors for an ingrown toenail include:  Age. Your nails tend to thicken as you get older, so ingrown nails are more common in older people.  Diabetes.  Cutting your toenails incorrectly.  Blood circulation problems. What are the signs or symptoms? Symptoms may include:  Pain, soreness, or tenderness.  Redness.  Swelling.  Hardening of the skin surrounding the toe. Your ingrown toenail may be infected if there is fluid, pus, or drainage. How is this diagnosed? An ingrown toenail may be diagnosed by medical history and physical exam. If your toenail is infected, your health care provider may test a sample of the drainage. How is this treated? Treatment depends on the severity of your ingrown toenail. Some ingrown toenails may be treated at home. More severe or infected ingrown toenails may require surgery to remove all or part of the nail. Infected ingrown toenails may also be treated with antibiotic medicines. Follow these instructions at home:  If you were prescribed an antibiotic medicine, finish all of it even if you start to feel better.  Soak your foot in warm soapy water for 20 minutes, 3 times per day or as directed by your health  care provider.  Carefully lift the edge of the nail away from the sore skin by wedging a small piece of cotton under the corner of the nail. This may help with the pain. Be careful not to cause more injury to the area.  Wear shoes that fit well. If your ingrown toenail is causing you pain, try wearing sandals, if possible.  Trim your toenails regularly and carefully. Do not cut them in a curved shape. Cut your toenails straight across. This prevents injury to the skin at the corners  of the toenail.  Keep your feet clean and dry.  If you are having trouble walking and are given crutches by your health care provider, use them as directed.  Do not pick at your toenail or try to remove it yourself.  Take medicines only as directed by your health care provider.  Keep all follow-up visits as directed by your health care provider. This is important. Contact a health care provider if:  Your symptoms do not improve with treatment. Get help right away if:  You have red streaks that start at your foot and go up your leg.  You have a fever.  You have increased redness, swelling, or pain.  You have fluid, blood, or pus coming from your toenail. This information is not intended to replace advice given to you by your health care provider. Make sure you discuss any questions you have with your health care provider. Document Released: 07/08/2000 Document Revised: 12/11/2015 Document Reviewed: 06/04/2014 Elsevier Interactive Patient Education  2017 [employer].

## 2016-08-13 NOTE — Progress Notes (Signed)
Subjective:  By signing my name below, I, Moises Blood, attest that this documentation has been prepared under the direction and in the presence of [name], MD. Electronically Signed: Moises Blood, [place]. 08/13/2016 , 4:01 PM .  Patient was seen in Room 9 .   Patient ID: Alejandro [name], male    DOB: 04/28/1961, 56 y.o.   MRN: [id number] Chief Complaint  Patient presents with  . Follow-up    Diabetes  . Cerumen Impaction    Left ear    HPI Alejandro [name] is a 56 y.o. male who presents to Primary Care at [facility] for diabetic follow up. He's been checking his sugar at home, measures ranging from 90s to 100s in the mornings. He notes that it was a little high during Christmas. He's felt a little jittery when he misses meals, but only occurs when he's overworking, less than once a week. His A1c was 5.9, 4 months ago.   He notes he's been feeling a little congested. He also has a sensation where he feels the need to pop his left ear. He's tried applying OTC "Instant Ear Dry". He denies any sinus pain or pressure.   He also mentions having ingrown toenail in his right great toe. He's been cutting too short. He also notes having a plantar wart, and says he goes to a [place] place to shave the wart down.   Past Medical History:  Diagnosis Date  . Diabetes mellitus without complication ([place])   . Hepatitis C   . Hypertension    Prior to Admission medications   Medication Sig Start Date End Date Taking? Authorizing Provider  Blood Glucose Monitoring Suppl (ONE TOUCH ULTRA 2) w/Device KIT USE AS DIRECTED TO TEST BLOOD SUGAR IN THE MORNING 08/07/16  Yes Stephanie D English, PA  Blood Glucose Monitoring Suppl KIT 1 application by Does not apply route every morning. 04/01/16  Yes Stephanie D English, PA  Blood Pressure Monitoring (BLOOD PRESSURE CUFF) MISC Use as directed 06/25/16  Yes Emeterio Reeve, DO  glucose blood (ONE TOUCH ULTRA TEST) test strip Check blood sugar twice a day.  06/02/16  Yes Stephanie D English, PA  lisinopril-hydrochlorothiazide (PRINZIDE,ZESTORETIC) 20-25 MG tablet Take 1 tablet by mouth daily. 04/13/16  Yes Wardell Honour, MD  metFORMIN (GLUCOPHAGE) 1000 MG tablet Take 1 tablet (1,000 mg total) by mouth 2 (two) times daily with a meal. 04/01/16  Yes Dorian Heckle English, PA   Allergies  Allergen Reactions  . Tylenol [Acetaminophen] Hypertension   Review of Systems  Constitutional: Negative for fatigue and unexpected weight change.  HENT: Positive for congestion and ear pain. Negative for ear discharge, sinus pain and sinus pressure.   Eyes: Negative for visual disturbance.  Respiratory: Negative for cough, chest tightness and shortness of breath.   Cardiovascular: Negative for chest pain, palpitations and leg swelling.  Gastrointestinal: Negative for abdominal pain and blood in stool.  Neurological: Negative for dizziness, light-headedness and headaches.       Objective:   Physical Exam  Constitutional: He is oriented to person, place, and time. He appears well-developed and well-nourished. No distress.  HENT:  Head: Normocephalic and atraumatic.  Left Ear: Tympanic membrane is erythematous and retracted.  Nose: Right sinus exhibits no maxillary sinus tenderness and no frontal sinus tenderness. Left sinus exhibits no maxillary sinus tenderness and no frontal sinus tenderness.  Eyes: EOM are normal. Pupils are equal, round, and reactive to light.  Neck: Neck supple.  Cardiovascular: Normal rate,  regular rhythm and normal heart sounds.   No murmur heard. Pulmonary/Chest: Effort normal and breath sounds normal. No respiratory distress. He has no wheezes.  Musculoskeletal: Normal range of motion.  Neurological: He is alert and oriented to person, place, and time.  Skin: Skin is warm and dry.  Right foot: thickened keratotic mass on the right first MTP plantar aspect, approximately 5-77m with central punctate white lesion  Psychiatric: He has a  normal mood and affect. His behavior is normal.  Nursing note and vitals reviewed.   BP (!) 142/90   Pulse 81   Temp 98.2 F (36.8 C) (Oral)   Resp 18   Ht 5' 11"  (1.803 m)   Wt 210 lb (95.3 kg)   SpO2 99%   BMI 29.29 kg/m    Results for orders placed or performed in visit on 08/13/16  POCT glycosylated hemoglobin (Hb A1C)  Result Value Ref Range   Hemoglobin A1C 4.9       Assessment & Plan:   1. Type 2 diabetes mellitus without complication, without long-term current use of insulin ([place]   2. Chronic hepatitis C without hepatic coma (HCC) - refer to ID hep clinic for treatment, needs RUQ [place] assess for poss sequelae  3. Medication monitoring encounter   4. Transaminitis   5. Bilateral acute serous otitis media, recurrence not specified   6. Type 2 diabetes mellitus with hyperglycemia, without long-term current use of insulin (HCC)   7. Plantar wart of right foot - cryo to 3 plantar warts today. RTC in 2 wks for repeat.    Orders Placed This Encounter  Procedures  . [place]Abdomen Limited RUQ    EPIC ORDER    Standing Status:   Future    Standing Expiration Date:   10/11/2017    Order Specific Question:   Reason for Exam (SYMPTOM  OR DIAGNOSIS REQUIRED)    Answer:   chronic hepatitis C with persistent LFT elevation    Order Specific Question:   Preferred imaging location?    Answer:   [facility]  . Comprehensive metabolic panel  . Microalbumin / creatinine urine ratio  . Ambulatory referral to Infectious Disease    Referral Priority:   Routine    Referral Type:   Consultation    Referral Reason:   Specialty Services Required    Requested Specialty:   Infectious Diseases    Number of Visits Requested:   1  . POCT glycosylated hemoglobin (Hb A1C)  . HM DIABETES FOOT EXAM    Meds ordered this encounter  Medications  . metFORMIN (GLUCOPHAGE) 500 MG tablet    Sig: Take 1 tablet (500 mg total) by mouth 2 (two) times daily with a meal.    Dispense:  180  tablet    Refill:  3  . lisinopril (PRINIVIL,ZESTRIL) 40 MG tablet    Sig: Take 1 tablet (40 mg total) by mouth daily.    Dispense:  90 tablet    Refill:  3  . hydrochlorothiazide (HYDRODIURIL) 25 MG tablet    Sig: Take 1 tablet (25 mg total) by mouth daily.    Dispense:  90 tablet    Refill:  3  . amoxicillin (AMOXIL) 500 MG capsule    Sig: Take 2 capsules (1,000 mg total) by mouth 2 (two) times daily.    Dispense:  40 capsule    Refill:  0  . pseudoephedrine (SUDAFED 12 HOUR) 120 MG 12 hr tablet    Sig:  Take 1 tablet (120 mg total) by mouth 2 (two) times daily.    Dispense:  30 tablet    Refill:  0  . fluticasone (FLONASE) 50 MCG/ACT nasal spray    Sig: Place 2 sprays into both nostrils at bedtime.    Dispense:  16 g    Refill:  2    I personally performed the services described in this documentation, which was scribed in my presence. The recorded information has been reviewed and considered, and addended by me as needed.   [name], M.D.  [facility] [address] [place], [place] [address] [phone] phone [phone] fax  08/23/16 10:44 PM

## 2016-08-14 LAB — COMPREHENSIVE METABOLIC PANEL
A/G RATIO: 1.5 (ref 1.2–2.2)
ALBUMIN: 4.5 g/dL (ref 3.5–5.5)
ALT: 94 IU/L — ABNORMAL HIGH (ref 0–44)
AST: 84 IU/L — ABNORMAL HIGH (ref 0–40)
Alkaline Phosphatase: 56 IU/L (ref 39–117)
BUN/Creatinine Ratio: 14 (ref 9–20)
BUN: 16 mg/dL (ref 6–24)
Bilirubin Total: <0.2 mg/dL (ref 0.0–1.2)
CALCIUM: 9.8 mg/dL (ref 8.7–10.2)
CO2: 26 mmol/L (ref 18–29)
CREATININE: 1.16 mg/dL (ref 0.76–1.27)
Chloride: 97 mmol/L (ref 96–106)
GFR, EST AFRICAN AMERICAN: 81 mL/min/{1.73_m2} (ref >59)
GFR, EST NON AFRICAN AMERICAN: 70 mL/min/{1.73_m2} (ref >59)
GLOBULIN, TOTAL: 3 g/dL (ref 1.5–4.5)
Glucose: 98 mg/dL (ref 65–99)
Potassium: 4.7 mmol/L (ref 3.5–5.2)
SODIUM: 138 mmol/L (ref 134–144)
TOTAL PROTEIN: 7.5 g/dL (ref 6.0–8.5)

## 2016-08-14 LAB — MICROALBUMIN / CREATININE URINE RATIO
Creatinine, Urine: 145 mg/dL
MICROALBUM., U, RANDOM: 3.4 ug/mL
Microalb/Creat Ratio: 2.3 mg/g creat (ref 0.0–30.0)

## 2016-08-31 ENCOUNTER — Ambulatory Visit (INDEPENDENT_AMBULATORY_CARE_PROVIDER_SITE_OTHER): Payer: BC Managed Care – PPO | Admitting: Family Medicine

## 2016-08-31 VITALS — BP 130/82 | HR 85 | Temp 98.1°F | Resp 17 | Ht 71.0 in | Wt 207.0 lb

## 2016-08-31 DIAGNOSIS — H6592 Unspecified nonsuppurative otitis media, left ear: Secondary | ICD-10-CM

## 2016-08-31 DIAGNOSIS — B070 Plantar wart: Secondary | ICD-10-CM

## 2016-08-31 DIAGNOSIS — L84 Corns and callosities: Secondary | ICD-10-CM

## 2016-08-31 MED ORDER — FLUTICASONE PROPIONATE 50 MCG/ACT NA SUSP: 2.0000 | Freq: Every day | NASAL | 2 refills | Status: DC

## 2016-08-31 NOTE — Patient Instructions (Addendum)
Return in 3 weeks for reapplication of cryotherapy to right foot.  Continue to use Flonase twice daily and take sudafed for serous otitis media.   IF you received an x-ray today, you will receive an invoice from [facility]. Please contact [facility] at [phone] with questions or concerns regarding your invoice.   IF you received labwork today, you will receive an invoice from [place]. Please contact [facility] at [phone] with questions or concerns regarding your invoice.   Our billing staff will not be able to assist you with questions regarding bills from these companies.  You will be contacted with the lab results as soon as they are available. The fastest way to get your results is to activate your My Chart account. Instructions are located on the last page of this paperwork. If you have not heard from [place] regarding the results in 2 weeks, please contact this office.

## 2016-08-31 NOTE — Progress Notes (Signed)
Patient ID: Alejandro [name], male    DOB: [birth year], 56 y.o.   MRN: [id number]  PCP: [name], MD  Chief Complaint  Patient presents with  . Follow-up    RT foot liquid nitro tx, and left ear     Subjective:  HPI  56 year old male presents for follow-up liquid nitrogen application to right foot callus.  Callus Right Foot  Reports a chronic problem with callus right foot. He normally goes to the nail salon to have the callus shaved from his foot.  He was seen and treated with 3 applications of liquid nitrogen on  Last treatment 08/13/16 and he was advised to return for another application.   Left Otitis Media-Non suppurative  Reports that he is continuing to use flonase nasal spray daily for otitis media. Denies pain or tenderness. He is continuing to experience some fullness of left ear only.    Social History   Social History  . Marital status: Married    Spouse name: N/A  . Number of children: N/A  . Years of education: N/A   Occupational History  . Not on file.   Social History Main Topics  . Smoking status: Former Research scientist (life sciences)  . Smokeless tobacco: Never Used  . Alcohol use Yes     Comment: occasional wine  . Drug use: No  . Sexual activity: Yes   Other Topics Concern  . Not on file   Social History Narrative   Marital status: married x 24 years      Children: 1 child; no grandchildren      Lives: with wife; daughter in [place]      Employment: Estate agent x 14 years; [employer]      Tobacco: quit smoking after ten years      Alcohol: 3 glasses of wine weekly      Exercise: none             Family History  Problem Relation Age of Onset  . Heart disease Mother 27    CAD/AMI  . Diabetes Mother   . Hypertension Mother   . Diabetes Maternal Grandfather   . Diabetes Paternal Grandfather   . Diabetes Father   . Diabetes Sister   . Hypertension Sister   . Hypertension Brother   . Diabetes  Brother   . Colon cancer Neg Hx   . Esophageal cancer Neg Hx   . Stomach cancer Neg Hx   . Rectal cancer Neg Hx    Review of Systems HPI Patient Active Problem List   Diagnosis Date Noted  . Diabetes ([place]) 09/14/2014  . Gallstones 09/14/2014  . Chronic hepatitis C virus infection ([place]) 12/01/2008  . ANEMIA, IRON DEFICIENCY 12/01/2008  . BLOOD IN STOOL 12/01/2008    Allergies  Allergen Reactions  . Tylenol [Acetaminophen] Hypertension    Prior to Admission medications   Medication Sig Start Date End Date Taking? Authorizing Provider  amoxicillin (AMOXIL) 500 MG capsule Take 2 capsules (1,000 mg total) by mouth 2 (two) times daily. 08/13/16  Yes Shawnee Knapp, MD  Blood Glucose Monitoring Suppl (ONE TOUCH ULTRA 2) w/Device KIT USE AS DIRECTED TO TEST BLOOD SUGAR IN THE MORNING 08/07/16  Yes Stephanie D English, PA  Blood Glucose Monitoring Suppl KIT 1 application by Does not apply route every morning. 04/01/16  Yes Dorian Heckle English, PA  Blood Pressure Monitoring (BLOOD PRESSURE CUFF) MISC Use as directed 06/25/16  Yes  Emeterio Reeve, DO  fluticasone (FLONASE) 50 MCG/ACT nasal spray Place 2 sprays into both nostrils at bedtime. 08/13/16  Yes Shawnee Knapp, MD  glucose blood (ONE TOUCH ULTRA TEST) test strip Check blood sugar twice a day. 06/02/16  Yes Stephanie D English, PA  hydrochlorothiazide (HYDRODIURIL) 25 MG tablet Take 1 tablet (25 mg total) by mouth daily. 08/13/16  Yes Shawnee Knapp, MD  lisinopril (PRINIVIL,ZESTRIL) 40 MG tablet Take 1 tablet (40 mg total) by mouth daily. 08/13/16  Yes Shawnee Knapp, MD  lisinopril-hydrochlorothiazide (PRINZIDE,ZESTORETIC) 20-25 MG tablet Take 1 tablet by mouth daily. 04/13/16  Yes Wardell Honour, MD  metFORMIN (GLUCOPHAGE) 500 MG tablet Take 1 tablet (500 mg total) by mouth 2 (two) times daily with a meal. 08/13/16  Yes Shawnee Knapp, MD  pseudoephedrine (SUDAFED 12 HOUR) 120 MG 12 hr tablet Take 1 tablet (120 mg total) by mouth 2 (two) times daily. 08/13/16   Yes Shawnee Knapp, MD    Past Medical, Surgical Family and Social History reviewed and updated.    Objective:   Today's Vitals   08/31/16 1624  BP: 130/82  Pulse: 85  Resp: 17  Temp: 98.1 F (36.7 C)  TempSrc: Oral  SpO2: 100%  Weight: 207 lb (93.9 kg)  Height: 5' 11"  (1.803 m)    Wt Readings from Last 3 Encounters:  08/31/16 207 lb (93.9 kg)  08/13/16 210 lb (95.3 kg)  06/25/16 209 lb (94.8 kg)   Physical Exam  Constitutional: He is oriented to person, place, and time. He appears well-developed and well-nourished.  HENT:  Head: Normocephalic and atraumatic.  Right Ear: External ear normal.  Left Ear: A middle ear effusion is present.  Eyes: Pupils are equal, round, and reactive to light.  Cardiovascular: Normal rate, regular rhythm, normal heart sounds and intact distal pulses.   Pulmonary/Chest: Breath sounds normal.  Neurological: He is alert and oriented to person, place, and time.  Skin: Skin is dry.  Informed consent was obtained. The callus was treated with with cryotherapy using liquid nitrogen applied x 3 times directly to the callus. The color changed to white with the application of acid. The patient tolerated procedure well and aftercare instructions were given to the patient.  Psychiatric: He has a normal mood and affect. His behavior is normal. Judgment and thought content normal.      Assessment & Plan:  1. Callus of foot- -Patient tolerated procedure -Return in 3 weeks for next applications  2. Left non-suppurative otitis media -Continue to use Flonase twice daily and take sudafed for serous otitis media.   Carroll Sage. Kenton Kingfisher, MSN, FNP-C [facility]

## 2016-09-01 ENCOUNTER — Telehealth: Payer: Self-pay

## 2016-09-01 NOTE — Telephone Encounter (Signed)
Per the notes on his AVS, he is to continue sudafed and Flonase for middle ear effusion. He doesn't have an ear infection. I refilled his Flonase yesterday.

## 2016-09-01 NOTE — Telephone Encounter (Signed)
Please refill if appropriate

## 2016-09-01 NOTE — Telephone Encounter (Signed)
Pt is calling to get a refill of his penicillin.  He states that Kenton Kingfisher said she would call in another round when he was seen yesterday for his ear infection but it was not called in.  Please advise  [phone]

## 2016-09-01 NOTE — Telephone Encounter (Signed)
Pt advised.

## 2016-09-23 ENCOUNTER — Other Ambulatory Visit: Payer: BC Managed Care – PPO

## 2016-09-23 ENCOUNTER — Other Ambulatory Visit: Payer: Self-pay | Admitting: Family Medicine

## 2016-09-23 ENCOUNTER — Ambulatory Visit
Admission: RE | Admit: 2016-09-23 | Discharge: 2016-09-23 | Disposition: A | Discharge status: Home / Self Care | Payer: BC Managed Care – PPO | Source: Ambulatory Visit | Attending: Family Medicine | Admitting: Family Medicine

## 2016-09-23 DIAGNOSIS — R932 Abnormal findings on diagnostic imaging of liver and biliary tract: Secondary | ICD-10-CM

## 2016-09-23 DIAGNOSIS — R7401 Elevation of levels of liver transaminase levels: Secondary | ICD-10-CM

## 2016-09-23 DIAGNOSIS — B182 Chronic viral hepatitis C: Secondary | ICD-10-CM

## 2016-09-23 DIAGNOSIS — R740 Nonspecific elevation of levels of transaminase and lactic acid dehydrogenase [LDH]: Secondary | ICD-10-CM

## 2016-09-24 ENCOUNTER — Ambulatory Visit (INDEPENDENT_AMBULATORY_CARE_PROVIDER_SITE_OTHER): Payer: BC Managed Care – PPO | Admitting: Physician Assistant

## 2016-09-24 ENCOUNTER — Encounter: Payer: Self-pay | Admitting: Physician Assistant

## 2016-09-24 VITALS — BP 112/60 | HR 87 | Temp 98.6°F | Resp 18 | Ht 71.0 in | Wt 206.0 lb

## 2016-09-24 DIAGNOSIS — B070 Plantar wart: Secondary | ICD-10-CM | POA: Diagnosis not present

## 2016-09-24 NOTE — Progress Notes (Signed)
  09/24/2016 1:32 PM   DOB: Sep 01, 1960 / MRN: [id number]  SUBJECTIVE:  Alejandro [name] is a 56 y.o. male presenting for recheck of foot lesions.  Reports he has been here twice now for cryotherapy with mild relieve of symptoms.  He has a history of very well controlled diabetes and HTN and sees Dr. Brigitte Pulse for these problems.   He is allergic to tylenol [acetaminophen].   He  has a past medical history of Diabetes mellitus without complication ([place]); Hepatitis C; and Hypertension.    He  reports that he has quit smoking. He has never used smokeless tobacco. He reports that he drinks alcohol. He reports that he does not use drugs. He  reports that he currently engages in sexual activity. The patient  has a past surgical history that includes Colonoscopy.  His family history includes Diabetes in his brother, father, maternal grandfather, mother, paternal grandfather, and sister; Heart disease (age of onset: 31) in his mother; Hypertension in his brother, mother, and sister.  Review of Systems  Constitutional: Negative for chills and fever.  Gastrointestinal: Negative for nausea.  Skin: Negative for rash.  Neurological: Negative for dizziness.    The problem list and medications were reviewed and updated by myself where necessary and exist elsewhere in the encounter.   OBJECTIVE:  BP 112/60   Pulse 87   Temp 98.6 F (37 C) (Oral)   Resp 18   Ht 5\' 11"  (1.803 m)   Wt 206 lb (93.4 kg)   SpO2 97%   BMI 28.73 kg/m   Physical Exam  Constitutional: He is oriented to person, place, and time.  Cardiovascular: Normal rate, regular rhythm and normal heart sounds.  Exam reveals no gallop, no friction rub and no decreased pulses.   No murmur heard. Pulmonary/Chest: Effort normal and breath sounds normal. No respiratory distress. He has no decreased breath sounds. He has no wheezes. He has no rhonchi. He has no rales.  Musculoskeletal: He exhibits no edema.       Feet:  Neurological: He is  alert and oriented to person, place, and time.  Skin: Skin is warm and dry. Rash noted. He is not diaphoretic.    Lab Results  Component Value Date   HGBA1C 4.9 08/13/2016   Procedure: Each wart scrapped with a 4 mm skin curette. Cryo times 5 to each lesion.  Patient tolerated procedure without complaint.   No results found for this or any previous visit (from the past 72 hour(s)).  No results found.  ASSESSMENT AND PLAN:  Alejandro [name] was seen today for follow-up and foot issue.  Diagnoses and all orders for this visit:  Plantar wart of both feet Comments: Cyro x 5.  RTC in three weeks if not resolved.     The patient is advised to call or return to clinic if he does not see an improvement in symptoms, or to seek the care of the closest emergency department if he worsens with the above plan.   Philis Fendt, MHS, PA-C [facility] 09/24/2016 1:32 PM

## 2016-09-24 NOTE — Patient Instructions (Signed)
     IF you received an x-ray today, you will receive an invoice from [facility]. Please contact [facility] at [phone] with questions or concerns regarding your invoice.   IF you received labwork today, you will receive an invoice from [facility]. Please contact [facility] at [phone] with questions or concerns regarding your invoice.   Our billing staff will not be able to assist you with questions regarding bills from these companies.  You will be contacted with the lab results as soon as they are available. The fastest way to get your results is to activate your My Chart account. Instructions are located on the last page of this paperwork. If you have not heard from us regarding the results in 2 weeks, please contact this office.     

## 2016-09-27 ENCOUNTER — Encounter: Payer: Self-pay | Admitting: Gastroenterology

## 2016-09-27 ENCOUNTER — Telehealth: Payer: Self-pay | Admitting: Family Medicine

## 2016-09-27 NOTE — Telephone Encounter (Signed)
Called patient and advised that BP reading is not abnormally low. Diastolic reading is mildly low is likely related to inadequate fluid intake as he is on a diuretic.  He last took his BP last Saturday and just wanted to mention this to someone. Recommended that he increased water intake for hydration purposes. Advised to continue BP medications and if he experiences any lightheaded or dizziness to notify our office of come in for evaluation.

## 2016-09-27 NOTE — Telephone Encounter (Signed)
Pt calling stating that his BP was 112/60 when he was here and was worried is it was to low since he went in Internet to find out if it was normal or not he is on lisinopril and HCTZ please respond to patient

## 2016-09-30 ENCOUNTER — Telehealth: Payer: Self-pay | Admitting: Emergency Medicine

## 2016-09-30 NOTE — Telephone Encounter (Signed)
-----   Message from Shawnee Knapp, MD sent at 09/23/2016  2:17 PM EST ----- Please let pt know that his liver does look a little abnormal as the tissue is being irritated either by fat in his diet or by the hepatitis C virus. It is possible that he has cirrhosis though hopefully it is fatty liver.  He also has gallstones that are quite large and it seems likely that he might need his gallbladder out at some point but it would likely be best to get the liver disease evaluated/treated first.  It looks like the Infectious Disease clinic called him once to get him into their Hep C clinic so he should call them back to schedule.  I will also refer him to GI for further evaluation though often times the local GI doctors do not treat cirrhosis but since we are not sure it is cirrhosis vs fatty liver they may be able to help.

## 2016-11-09 ENCOUNTER — Encounter (INDEPENDENT_AMBULATORY_CARE_PROVIDER_SITE_OTHER): Payer: Self-pay

## 2016-11-09 ENCOUNTER — Ambulatory Visit (INDEPENDENT_AMBULATORY_CARE_PROVIDER_SITE_OTHER): Payer: BC Managed Care – PPO | Admitting: Gastroenterology

## 2016-11-09 ENCOUNTER — Encounter: Payer: Self-pay | Admitting: Gastroenterology

## 2016-11-09 VITALS — BP 128/70 | HR 72 | Wt 209.6 lb

## 2016-11-09 DIAGNOSIS — Z86010 Personal history of colonic polyps: Secondary | ICD-10-CM

## 2016-11-09 DIAGNOSIS — R945 Abnormal results of liver function studies: Secondary | ICD-10-CM

## 2016-11-09 DIAGNOSIS — R7989 Other specified abnormal findings of blood chemistry: Secondary | ICD-10-CM | POA: Diagnosis not present

## 2016-11-09 DIAGNOSIS — B182 Chronic viral hepatitis C: Secondary | ICD-10-CM | POA: Diagnosis not present

## 2016-11-09 NOTE — Progress Notes (Signed)
    History of Present Illness: This is a 56 year old male referred by Dr. Brigitte Pulse for evaluation of abnormal LFTs. HVC Ab positive with HCV quantitative 956,997 and HCV quantitative log 5.98. He has no gastrointestinal complaints.   AST 0 - 40 IU/L 84   61R   54R      ALT 0 - 44 IU/L 94   70R   59R      Abd US IMPRESSION: 1. Multiple gallstones. Non mobile stone in the neck of the gallbladder noted. No gallbladder distention. Negative Murphy sign. No biliary distention. 2. Continues hepatic parenchymal pattern suggesting fatty infiltration and/or hepatocellular disease. Slightly lobular hepatic border periods cirrhosis cannot be excluded .  Current Medications, Allergies, Past Medical History, Past Surgical History, Family History and Social History were reviewed in [employer] record.  Physical Exam: General: Well developed, well nourished, no acute distress Head: Normocephalic and atraumatic Eyes:  sclerae anicteric, EOMI Ears: Normal auditory acuity Mouth: No deformity or lesions Lungs: Clear throughout to auscultation Heart: Regular rate and rhythm; no murmurs, rubs or bruits Abdomen: Soft, non tender and non distended. No masses, hepatosplenomegaly or hernias noted. Normal Bowel sounds Musculoskeletal: Symmetrical with no gross deformities  Pulses:  Normal pulses noted Extremities: No clubbing, cyanosis, edema or deformities noted Neurological: Alert oriented x 4, grossly nonfocal Psychological:  Alert and cooperative. Normal mood and affect  Assessment and Recommendations:  1. Elevated LFTs secondary to HCV or fatty liver. He has an appointment with Dr. Scharlene Gloss in ID later ths month and he is very motivated to start treatment for HCV eradication. He is advised to follow a low-fat, carb modified diet and maintain ideal body weight. Continue optimal management of diabetes. No further evaluation of elevated LFTs is planned until HCV treatment has been  completed. Advised him to return for follow-up with me if his LFTs remain elevated after HCV treatment has been completed.  2. Cholelithiasis, asymptomatic.   3. Personal history of adenomatous colon polyps. A five-year interval surveillance colonoscopy is recommended in June 2022.

## 2016-11-09 NOTE — Patient Instructions (Signed)
Keep your appointment with Dr. Linus Salmons on 11/16/16.   Thank you for choosing me and [facility].  Pricilla Riffle. Dagoberto Ligas., MD., Mar[facility] Regal

## 2016-11-16 ENCOUNTER — Ambulatory Visit (INDEPENDENT_AMBULATORY_CARE_PROVIDER_SITE_OTHER): Payer: BC Managed Care – PPO | Admitting: Internal Medicine

## 2016-11-16 ENCOUNTER — Encounter: Payer: Self-pay | Admitting: Internal Medicine

## 2016-11-16 VITALS — BP 151/91 | HR 101 | Temp 97.7°F | Ht 69.0 in | Wt 210.0 lb

## 2016-11-16 DIAGNOSIS — B182 Chronic viral hepatitis C: Secondary | ICD-10-CM | POA: Diagnosis not present

## 2016-11-16 LAB — CBC WITH DIFFERENTIAL/PLATELET
BASOS ABS: 0 {cells}/uL (ref 0–200)
Basophils Relative: 0 %
EOS ABS: 0 {cells}/uL — AB (ref 15–500)
Eosinophils Relative: 0 %
HEMATOCRIT: 40.6 % (ref 38.5–50.0)
Hemoglobin: 13.3 g/dL (ref 13.2–17.1)
Lymphocytes Relative: 36 %
Lymphs Abs: 2088 cells/uL (ref 850–3900)
MCH: 28.7 pg (ref 27.0–33.0)
MCHC: 32.8 g/dL (ref 32.0–36.0)
MCV: 87.5 fL (ref 80.0–100.0)
MONOS PCT: 7 %
MPV: 10 fL (ref 7.5–12.5)
Monocytes Absolute: 406 cells/uL (ref 200–950)
NEUTROS ABS: 3306 {cells}/uL (ref 1500–7800)
Neutrophils Relative %: 57 %
PLATELETS: 203 10*3/uL (ref 140–400)
RBC: 4.64 MIL/uL (ref 4.20–5.80)
RDW: 13.9 % (ref 11.0–15.0)
WBC: 5.8 10*3/uL (ref 3.8–10.8)

## 2016-11-16 LAB — COMPLETE METABOLIC PANEL WITH GFR
AG Ratio: 1.5 Ratio (ref 1.0–2.5)
ALT: 87 U/L — ABNORMAL HIGH (ref 9–46)
AST: 87 U/L — ABNORMAL HIGH (ref 10–35)
Albumin: 4.3 g/dL (ref 3.6–5.1)
Alkaline Phosphatase: 53 U/L (ref 40–115)
BILIRUBIN TOTAL: 0.6 mg/dL (ref 0.2–1.2)
BUN/Creatinine Ratio: 14.2 Ratio (ref 6–22)
BUN: 15 mg/dL (ref 7–25)
CO2: 21 mmol/L (ref 20–31)
CREATININE: 1.06 mg/dL (ref 0.70–1.33)
Calcium: 9.5 mg/dL (ref 8.6–10.3)
Chloride: 103 mmol/L (ref 98–110)
GFR, EST NON AFRICAN AMERICAN: 78 mL/min (ref >=60)
GLOBULIN: 2.9 g/dL (ref 1.9–3.7)
GLUCOSE: 78 mg/dL (ref 65–99)
Potassium: 3.8 mmol/L (ref 3.5–5.3)
SODIUM: 138 mmol/L (ref 135–146)
Total Protein: 7.2 g/dL (ref 6.1–8.1)

## 2016-11-16 LAB — PROTIME-INR
INR: 1
Prothrombin Time: 10.9 s (ref 9.0–11.5)

## 2016-11-16 NOTE — Progress Notes (Signed)
[facility] for Infectious Disease   CC: consideration for treatment for chronic hepatitis C  HPI:  +Alejandro [name] is a 56 y.o. male who presents for initial evaluation and management of chronic hepatitis C.  Patient tested positive 2-3 years ago. Hepatitis C-associated risk factors present are: none. Patient denies history of blood transfusion, IV drug abuse. Patient has had other studies performed. Results: hepatitis C RNA by PCR, result: positive. Patient has not had prior treatment for Hepatitis C. Patient does not have a past history of liver disease. Patient does not have a family history of liver disease. Patient does not  have associated signs or symptoms related to liver disease.  Labs reviewed and confirm chronic hepatitis C with a positive viral load.   Records reviewed from Epic and also has fatty liver with transaminitis and saw Dr. Fuller Plan.      Patient does not have documented immunity to Hepatitis A. Patient does not have documented immunity to Hepatitis B.    Review of Systems:  Constitutional: negative for fatigue and malaise Respiratory: negative for cough Integument/breast: negative for rash All other systems reviewed and are negative       Past Medical History:  Diagnosis Date  . Diabetes mellitus without complication ([name])   . Fatty liver   . Hepatitis C   . Hypertension   . Tubular adenoma of colon 12/2015    Prior to Admission medications   Medication Sig Start Date End Date Taking? Authorizing Provider  Blood Glucose Monitoring Suppl (ONE TOUCH ULTRA 2) w/Device KIT USE AS DIRECTED TO TEST BLOOD SUGAR IN THE MORNING 08/07/16  Yes Stephanie D English, PA  Blood Glucose Monitoring Suppl KIT 1 application by Does not apply route every morning. 04/01/16  Yes Stephanie D English, PA  Blood Pressure Monitoring (BLOOD PRESSURE CUFF) MISC Use as directed 06/25/16  Yes Emeterio Reeve, DO  glucose blood (ONE TOUCH ULTRA TEST) test strip Check blood sugar  twice a day. 06/02/16  Yes Stephanie D English, PA  hydrochlorothiazide (HYDRODIURIL) 25 MG tablet Take 1 tablet (25 mg total) by mouth daily. 08/13/16  Yes Shawnee Knapp, MD  lisinopril (PRINIVIL,ZESTRIL) 40 MG tablet Take 1 tablet (40 mg total) by mouth daily. 08/13/16  Yes Shawnee Knapp, MD  metFORMIN (GLUCOPHAGE) 500 MG tablet Take 1 tablet (500 mg total) by mouth 2 (two) times daily with a meal. 08/13/16  Yes Shawnee Knapp, MD    Allergies  Allergen Reactions  . Tylenol [Acetaminophen] Hypertension    Social History  Substance Use Topics  . Smoking status: Former Research scientist (life sciences)  . Smokeless tobacco: Never Used  . Alcohol use Yes     Comment: occasional wine    Family History  Problem Relation Age of Onset  . Heart disease Mother 62    CAD/AMI  . Diabetes Mother   . Hypertension Mother   . Diabetes Maternal Grandfather   . Diabetes Paternal Grandfather   . Diabetes Father   . Diabetes Sister   . Hypertension Sister   . Hypertension Brother   . Diabetes Brother   . Colon cancer Neg Hx   . Esophageal cancer Neg Hx   . Stomach cancer Neg Hx   . Rectal cancer Neg Hx   no cirrhosis, no liver cancer   Objective:  Constitutional: in no apparent distress,  Vitals:   11/16/16 1038  BP: (!) 151/91  Pulse: (!) 101  Temp: 97.7 F (36.5 C)   Eyes: anicteric  Cardiovascular: Cor RRR Respiratory: CTA B; normal respiratory effort Gastrointestinal: Bowel sounds are normal, soft, nt,nd Musculoskeletal: no pedal edema noted Skin: negatives: no rash; no porphyria cutanea tarda Lymphatic: no cervical lymphadenopathy   Laboratory Genotype: No results found for: HCVGENOTYPE HCV viral load:  Lab Results  Component Value Date   HCVQUANT 956,997 (H) 08/26/2015   Lab Results  Component Value Date   WBC 5.9 12/01/2015   HGB 14.3 12/01/2015   HCT 44.5 12/01/2015   MCV 89.0 12/01/2015   PLT 229 12/01/2015    Lab Results  Component Value Date   CREATININE 1.16 08/13/2016   BUN 16 08/13/2016     NA 138 08/13/2016   K 4.7 08/13/2016   CL 97 08/13/2016   CO2 26 08/13/2016    Lab Results  Component Value Date   ALT 94 (H) 08/13/2016   AST 84 (H) 08/13/2016   ALKPHOS 56 08/13/2016     Labs and history reviewed and show CHILD-PUGH unknown  5-6 points: Child class A 7-9 points: Child class B 10-15 points: Child class C  Lab Results  Component Value Date   BILITOT <0.2 08/13/2016   ALBUMIN 4.5 08/13/2016     Assessment: New Patient with Chronic Hepatitis C genotype unknown, untreated.  I discussed with the patient the lab findings that confirm chronic hepatitis C as well as the natural history and progression of disease including about 30% of people who develop cirrhosis of the liver if left untreated and once cirrhosis is established there is a 2-7% risk per year of liver cancer and liver failure.  I discussed the importance of treatment and benefits in reducing the risk, even if significant liver fibrosis exists.   Plan: 1) Patient counseled extensively on limiting acetaminophen to no more than 2 grams daily, avoidance of alcohol. 2) Transmission discussed with patient including sexual transmission, sharing razors and toothbrush.   3) Will need referral to gastroenterology if concern for cirrhosis 4) Will need referral for substance abuse counseling: No.; Further work up to include urine drug screen  No. 5) Will prescribe appropriate medication based on genotype and coverage  6) Hepatitis A and B titers 7) Pneumovax vaccine today 9) Further work up to include liver staging with elastography 10) will follow up after starting medication He will defer treatment until [facility] is out in June. Also will get elastography then

## 2016-11-16 NOTE — Patient Instructions (Signed)
Date 11/16/16  Dear Mr Mabe, As discussed in the [facility], your hepatitis C therapy will include highly effective medication(s) for treatment and will vary based on the type of hepatitis C and insurance approval.  Potential medications include:          Harvoni (sofosbuvir 90mg /ledipasvir 400mg ) tablet oral daily          OR     Epclusa (sofosbuvir 400mg /velpatasvir 100mg ) tablet oral daily          OR      Mavyret (glecaprevir 100 mg/pibrentasvir 40 mg): Take 3 tablets oral daily          OR     Zepatier (elbasvir 50 mg/grazoprevir 100 mg) oral daily, +/- ribavirin              Medications are typically for 8 or 12 weeks total ---------------------------------------------------------------- Your HCV Treatment Start Date: You will be notified by our office once the medication is approved and where you can pick it up (or if mailed)   ---------------------------------------------------------------- [place]:   [facility] [place], [place] [address] Phone: [phone] Hours: Monday to Friday 7:30 am to 6:00 pm   Please always contact your pharmacy at least 3-4 business days before you run out of medications to ensure your next month's medication is ready or 1 week prior to running out if you receive it by mail.  Remember, each prescription is for 28 days. ---------------------------------------------------------------- GENERAL NOTES REGARDING YOUR HEPATITIS C MEDICATION:  Some medications have the following interactions:  - Acid reducing agents such as H2 blockers (ie. Pepcid (famotidine), Zantac (ranitidine), Tagamet (cimetidine), Axid (nizatidine) and proton pump inhibitors (ie. Prilosec (omeprazole), Protonix (pantoprazole), Nexium (esomeprazole), or Aciphex (rabeprazole)). Do not take until you have discussed with a health care provider.    -Antacids that contain magnesium and/or aluminum hydroxide (ie. Milk of Magensia, Rolaids,  Gaviscon, Maalox, Mylanta, an dArthritis Pain Formula).  -Calcium carbonate (calcium supplements or antacids such as Tums, Caltrate, Os-Cal).  -St. John's wort or any products that contain St. John's wort like some herbal supplements  Please inform the office prior to starting any of these medications.  - The common side effects associated with Harvoni include:      1. Fatigue      2. Headache      3. Nausea      4. Diarrhea      5. Insomnia  Please note that this only lists the most common side effects and is NOT a comprehensive list of the potential side effects of these medications. For more information, please review the drug information sheets that come with your medication package from the pharmacy.  ---------------------------------------------------------------- GENERAL HELPFUL HINTS ON HCV THERAPY: 1. Stay well-hydrated. 2. Notify the [facility] Clinic of any changes in your other over-the-counter/herbal or prescription medications. 3. If you miss a dose of your medication, take the missed dose as soon as you remember. Return to your regular time/dose schedule the next day.  4.  Do not stop taking your medications without first talking with your healthcare provider. 5.  You may take Tylenol (acetaminophen), as long as the dose is less than 2000 mg (OR no more than 4 tablets of the Tylenol Extra Strengths 500mg  tablet) in 24 hours. 6.  You will see our pharmacist-specialist within the first 2 weeks of starting your medication to monitor for any possible side effects. 7.  You will have labs once during treatment, soon  after treatment completion and one final lab 6 months after treatment completion to verify the virus is out of your system.  Scharlene Gloss, [place] for [place] [place], [place]  [phone]

## 2016-11-17 LAB — HEPATITIS B SURFACE ANTIBODY,QUALITATIVE: Hep B S Ab: POSITIVE — AB

## 2016-11-17 LAB — HIV ANTIBODY (ROUTINE TESTING W REFLEX): HIV: NONREACTIVE

## 2016-11-17 LAB — HEPATITIS B CORE ANTIBODY, TOTAL: HEP B C TOTAL AB: NONREACTIVE

## 2016-11-17 LAB — HEPATITIS B SURFACE ANTIGEN: HEP B S AG: NEGATIVE

## 2016-11-17 LAB — HEPATITIS A ANTIBODY, TOTAL: Hep A Total Ab: REACTIVE — AB

## 2016-11-19 LAB — LIVER FIBROSIS, FIBROTEST-ACTITEST
ALT: 86 U/L — ABNORMAL HIGH (ref 9–46)
Alpha-2-Macroglobulin: 315 mg/dL — ABNORMAL HIGH (ref 106–279)
Apolipoprotein A1: 172 mg/dL (ref 94–176)
BILIRUBIN: 0.6 mg/dL (ref 0.2–1.2)
Fibrosis Score: 0.5
GGT: 57 U/L (ref 3–85)
Haptoglobin: 112 mg/dL (ref 43–212)
NECROINFLAMMAT ACT SCORE: 0.58
REFERENCE ID: 1923645

## 2016-11-21 LAB — HCV RNA, QN PCR RFLX GENO, LIPA
HCV RNA, PCR, QN: 243000 IU/mL — ABNORMAL HIGH
HCV RNA, PCR, QN: 5.39 log IU/mL — ABNORMAL HIGH

## 2016-11-21 LAB — HEPATITIS C GENOTYPE

## 2016-12-27 ENCOUNTER — Ambulatory Visit (HOSPITAL_COMMUNITY)
Admission: RE | Admit: 2016-12-27 | Discharge: 2016-12-27 | Disposition: A | Discharge status: Home / Self Care | Payer: BC Managed Care – PPO | Source: Ambulatory Visit | Attending: Internal Medicine | Admitting: Internal Medicine

## 2016-12-27 DIAGNOSIS — B182 Chronic viral hepatitis C: Secondary | ICD-10-CM

## 2016-12-28 ENCOUNTER — Other Ambulatory Visit: Payer: Self-pay | Admitting: Pharmacist

## 2016-12-28 DIAGNOSIS — B182 Chronic viral hepatitis C: Secondary | ICD-10-CM

## 2016-12-28 MED ORDER — LEDIPASVIR-SOFOSBUVIR 90-400 MG PO TABS
1.0000 | ORAL_TABLET | Freq: Every day | ORAL | 2 refills | Status: DC
Start: 2016-12-28 — End: 2016-12-29

## 2016-12-29 ENCOUNTER — Other Ambulatory Visit: Payer: Self-pay | Admitting: Pharmacist

## 2016-12-29 DIAGNOSIS — B182 Chronic viral hepatitis C: Secondary | ICD-10-CM

## 2016-12-29 MED ORDER — LEDIPASVIR-SOFOSBUVIR 90-400 MG PO TABS: 1.0000 | ORAL_TABLET | Freq: Every day | ORAL | 2 refills | Status: DC

## 2017-01-10 ENCOUNTER — Encounter: Payer: Self-pay | Admitting: Pharmacy Technician

## 2017-01-11 ENCOUNTER — Ambulatory Visit (HOSPITAL_COMMUNITY)
Admission: RE | Admit: 2017-01-11 | Discharge: 2017-01-11 | Disposition: A | Discharge status: Home / Self Care | Payer: BC Managed Care – PPO | Source: Ambulatory Visit | Attending: Internal Medicine | Admitting: Internal Medicine

## 2017-01-11 DIAGNOSIS — K8020 Calculus of gallbladder without cholecystitis without obstruction: Secondary | ICD-10-CM | POA: Diagnosis not present

## 2017-01-11 DIAGNOSIS — N281 Cyst of kidney, acquired: Secondary | ICD-10-CM | POA: Insufficient documentation

## 2017-01-11 DIAGNOSIS — B182 Chronic viral hepatitis C: Secondary | ICD-10-CM | POA: Diagnosis present

## 2017-01-12 ENCOUNTER — Telehealth: Payer: Self-pay | Admitting: Pharmacist Clinician (PhC)/ Clinical Pharmacy Specialist

## 2017-01-12 NOTE — Telephone Encounter (Signed)
Alejandro [name] called to ask if being irritable and has "crick" in the neck has anything to do with Harvoni. Told him that it's unlikely due to harvoni and take some Tylenol or Advil for it.

## 2017-02-07 ENCOUNTER — Ambulatory Visit (INDEPENDENT_AMBULATORY_CARE_PROVIDER_SITE_OTHER): Payer: BC Managed Care – PPO | Admitting: Pharmacist

## 2017-02-07 DIAGNOSIS — B182 Chronic viral hepatitis C: Secondary | ICD-10-CM

## 2017-02-07 NOTE — Progress Notes (Signed)
HPI: Alejandro [name] is a 56 y.o. male who presents to the [facility] for follow-up of his Hep-C infection.  He has GT1b, F0/F1 on ultrasound and F2 on fibrosure, and started 12 weeks of Harvoni on 6/18.  Lab Results  Component Value Date   HCVGENOTYPE 1b 11/16/2016    Allergies: Allergies  Allergen Reactions  . Tylenol [Acetaminophen] Hypertension    Past Medical History: Past Medical History:  Diagnosis Date  . Diabetes mellitus without complication ([place])   . Fatty liver   . Hepatitis C   . Hypertension   . Tubular adenoma of colon 12/2015    Social History: Social History   Social History  . Marital status: Married    Spouse name: N/A  . Number of children: N/A  . Years of education: N/A   Social History Main Topics  . Smoking status: Former Research scientist (life sciences)  . Smokeless tobacco: Never Used  . Alcohol use Yes     Comment: occasional wine  . Drug use: No  . Sexual activity: Yes   Other Topics Concern  . Not on file   Social History Narrative   Marital status: married x 24 years      Children: 1 child; no grandchildren      Lives: with wife; daughter in [place]      Employment: Estate agent x 14 years; [employer]      Tobacco: quit smoking after ten years      Alcohol: 3 glasses of wine weekly      Exercise: none             Labs: Hep B S Ab (no units)  Date Value  11/16/2016 POS (A)   Hepatitis B Surface Ag (no units)  Date Value  11/16/2016 NEGATIVE   HCV Ab (no units)  Date Value  08/26/2015 REACTIVE (A)    Lab Results  Component Value Date   HCVGENOTYPE 1b 11/16/2016    Hepatitis C RNA quantitative Latest Ref Rng & Units 08/26/2015  HCV Quantitative <15 IU/mL 956,997(H)  HCV Quantitative Log <1.18 log 10 5.98(H)    AST  Date Value  11/16/2016 87 U/L (H)  08/13/2016 84 IU/L (H)  04/01/2016 61 U/L (H)   ALT  Date Value  11/16/2016 87 U/L (H)  11/16/2016 86 U/L (H)   08/13/2016 94 IU/L (H)  04/01/2016 70 U/L (H)   INR (no units)  Date Value  11/16/2016 1.0    CrCl: CrCl cannot be calculated (Patient's most recent lab result is older than the maximum 21 days allowed.).  Fibrosis Score: F2 as assessed by fibrosure   Child-Pugh Score: A  Previous Treatment Regimen: None  Assessment: Alejandro [name] is here today for Hep C follow-up. He started Harvoni ~1 month ago on 6/18. He called a few weeks ago with questions about pain in his neck and such and wondered if it was due to the [place]. He is not having any side effects anymore or anything new. He tells me he has missed no doses since starting.  He got his 2nd bottle a few days ago. He takes it at 12pm every day. He has F0/F1 on ultrasound but F2 on fibrosure. I will get a HCV RNA today and make his EOT appointment.    Plans: - Continue Harvoni x 12 weeks - HCV RNA today - F/u with me EOT 9/18 at 9am - Cure visit set for 12/19 at 9am  Alejandro [name] L. Tamasha Laplante, PharmD, [facility] 02/07/2017, 3:51 PM

## 2017-02-09 LAB — HEPATITIS C RNA QUANTITATIVE
HCV Quantitative Log: <1.18 Log IU/mL
HCV Quantitative: <15 IU/mL

## 2017-03-11 ENCOUNTER — Ambulatory Visit (INDEPENDENT_AMBULATORY_CARE_PROVIDER_SITE_OTHER): Payer: BC Managed Care – PPO | Admitting: Family Medicine

## 2017-03-11 ENCOUNTER — Encounter: Payer: Self-pay | Admitting: Family Medicine

## 2017-03-11 VITALS — BP 130/82 | HR 86 | Temp 98.5°F | Resp 16 | Ht 70.47 in | Wt 214.0 lb

## 2017-03-11 DIAGNOSIS — R740 Nonspecific elevation of levels of transaminase and lactic acid dehydrogenase [LDH]: Secondary | ICD-10-CM | POA: Diagnosis not present

## 2017-03-11 DIAGNOSIS — I10 Essential (primary) hypertension: Secondary | ICD-10-CM | POA: Diagnosis not present

## 2017-03-11 DIAGNOSIS — Z5181 Encounter for therapeutic drug level monitoring: Secondary | ICD-10-CM | POA: Diagnosis not present

## 2017-03-11 DIAGNOSIS — E119 Type 2 diabetes mellitus without complications: Secondary | ICD-10-CM

## 2017-03-11 DIAGNOSIS — R7401 Elevation of levels of liver transaminase levels: Secondary | ICD-10-CM

## 2017-03-11 LAB — POCT GLYCOSYLATED HEMOGLOBIN (HGB A1C): HEMOGLOBIN A1C: 6.1

## 2017-03-11 MED ORDER — AMLODIPINE BESYLATE 5 MG PO TABS: 5.0000 mg | ORAL_TABLET | Freq: Every day | ORAL | 0 refills | Status: DC

## 2017-03-11 NOTE — Progress Notes (Addendum)
By signing my name below, I, Mesha Guinyard, attest that this documentation has been prepared under the direction and in the presence of [name], MD.  Electronically Signed: Verlee Monte, Medical Scribe. 03/11/17. 10:30 AM.  Subjective:    Patient ID: Alejandro [name], male    DOB: [birth year], 56 y.o.   MRN: [id number]  HPI  Chief Complaint  Patient presents with  . Diabetes    follow-up  . Hypertension    HPI Comments: Alejandro [name] is a 56 y.o. male who presents to Primary Care at [facility] for HTN and DM follow-up. I last saw him 7 months prior. He has been treated for Hep C; in the interim on Harvoni.  Pt is not fasting.  DM: Pt is compliant with metformin BID (breakfast and dinner). Pt's blood sugar reads 130-140 in the morning, but around dinner getting largely 110, occasionally 90-140. Pt occasionally eats and checks his blood sugar afterwards. He occasionally has hypoglycemic episodes when he doesn't eat as much. He had 2 full time jobs at one point, but he's now part time for one other job. Pt was eating more nuts and drink pomegranate juice more in June when his blood sugar was lower. When his blood sugar was higher at the end of May he was celebrating graduations with a lot of junk food and cookouts. Pt last saw his ophthalmologist in Jan. - his eye health was nl. Pt does not take a baby aspirin daily. Denies experiencing any negative side effects.  HTN: Pt's bp has been "ok".  Hep C: Pt only has 20 more pills of harvoni left. States " I can feel it working."  Patient Active Problem List   Diagnosis Date Noted  . Essential hypertension 03/11/2017  . Diabetes ([place]) 09/14/2014  . Gallstones 09/14/2014  . Chronic hepatitis C virus infection ([place]) 12/01/2008  . ANEMIA, IRON DEFICIENCY 12/01/2008  . BLOOD IN STOOL 12/01/2008   Past Medical History:  Diagnosis Date  . Diabetes mellitus without complication ([place])   . Fatty liver   . Hepatitis C   . Hypertension   .  Tubular adenoma of colon 12/2015   Past Surgical History:  Procedure Laterality Date  . COLONOSCOPY     Allergies  Allergen Reactions  . Tylenol [Acetaminophen] Hypertension   Prior to Admission medications   Medication Sig Start Date End Date Taking? Authorizing Provider  Blood Glucose Monitoring Suppl (ONE TOUCH ULTRA 2) w/Device KIT USE AS DIRECTED TO TEST BLOOD SUGAR IN THE MORNING 08/07/16  Yes English, Stephanie D, PA  Blood Glucose Monitoring Suppl KIT 1 application by Does not apply route every morning. 04/01/16  Yes Ivar Drape D, PA  Blood Pressure Monitoring (BLOOD PRESSURE CUFF) MISC Use as directed 06/25/16  Yes Emeterio Reeve, DO  glucose blood (ONE TOUCH ULTRA TEST) test strip Check blood sugar twice a day. 06/02/16  Yes English, Colletta Maryland D, PA  hydrochlorothiazide (HYDRODIURIL) 25 MG tablet Take 1 tablet (25 mg total) by mouth daily. 08/13/16  Yes Shawnee Knapp, MD  Ledipasvir-Sofosbuvir (HARVONI) 90-400 MG TABS Take 1 tablet by mouth daily. 12/29/16  Yes Kuppelweiser, Cassie L, RPH  lisinopril (PRINIVIL,ZESTRIL) 40 MG tablet Take 1 tablet (40 mg total) by mouth daily. 08/13/16  Yes Shawnee Knapp, MD  metFORMIN (GLUCOPHAGE) 500 MG tablet Take 1 tablet (500 mg total) by mouth 2 (two) times daily with a meal. 08/13/16  Yes Shawnee Knapp, MD   Social History   Social History  .  Marital status: Married    Spouse name: N/A  . Number of children: N/A  . Years of education: N/A   Occupational History  . Not on file.   Social History Main Topics  . Smoking status: Former Research scientist (life sciences)  . Smokeless tobacco: Never Used  . Alcohol use Yes     Comment: occasional wine  . Drug use: No  . Sexual activity: Yes   Other Topics Concern  . Not on file   Social History Narrative   Marital status: married x 24 years      Children: 1 child; no grandchildren      Lives: with wife; daughter in [place]      Employment: Estate agent x 14 years; [employer] floor tech      Tobacco: quit smoking after ten years      Alcohol: 3 glasses of wine weekly      Exercise: none            Depression screen [facility] 2/9 03/11/2017 11/16/2016 09/24/2016 08/31/2016 08/13/2016  Decreased Interest 0 0 0 0 0  Down, Depressed, Hopeless 0 0 0 0 0  PHQ - 2 Score 0 0 0 0 0    Review of Systems  Neurological: Positive for weakness.   Objective:  Physical Exam  Constitutional: He appears well-developed and well-nourished. No distress.  HENT:  Head: Normocephalic and atraumatic.  Eyes: Conjunctivae are normal.  Neck: Neck supple.  Cardiovascular: Normal rate.   Pulmonary/Chest: Effort normal.  Neurological: He is alert.  Skin: Skin is warm and dry.  Psychiatric: He has a normal mood and affect. His behavior is normal.  Nursing note and vitals reviewed.   Vitals:   03/11/17 1013  BP: (!) 149/90  [name]: 86  Resp: 16  Temp: 98.5 F (36.9 C)  TempSrc: Oral  SpO2: 100%  Weight: 214 lb (97.1 kg)  Height: 5' 10.47" (1.79 m)   Body mass index is 30.3 kg/m. Assessment & Plan:   1. Type 2 diabetes mellitus without complication, without long-term current use of insulin ([place])   2. Transaminitis   3. Essential hypertension   4. Medication monitoring encounter     Orders Placed This Encounter  Procedures  . Comprehensive metabolic panel    Order Specific Question:   Has the patient fasted?    Answer:   Yes  . Lipid panel    Standing Status:   Future    Standing Expiration Date:   03/11/2018    Order Specific Question:   Has the patient fasted?    Answer:   Yes  . POCT glycosylated hemoglobin (Hb A1C)    Meds ordered this encounter  Medications  . amLODipine (NORVASC) 5 MG tablet    Sig: Take 1 tablet (5 mg total) by mouth daily.    Dispense:  90 tablet    Refill:  0    I personally performed the services described in this documentation, which was scribed in my presence. The recorded information has been reviewed and considered, and  addended by me as needed.   [name], M.D.  Primary Care at [facility] [address] [place], [place] [address] [phone] phone [phone] fax  03/13/17 11:35 PM  Results for orders placed or performed in visit on 03/11/17  Comprehensive metabolic panel  Result Value Ref Range   Glucose 85 65 - 99 mg/dL   BUN 17 6 - 24 mg/dL  Creatinine, Ser 1.27 0.76 - 1.27 mg/dL   GFR calc non Af Amer 63 >59 mL/min/1.73   GFR calc Af Amer 73 >59 mL/min/1.73   BUN/Creatinine Ratio 13 9 - 20   Sodium 141 134 - 144 mmol/L   Potassium 4.8 3.5 - 5.2 mmol/L   Chloride 102 96 - 106 mmol/L   CO2 26 20 - 29 mmol/L   Calcium 9.2 8.7 - 10.2 mg/dL   Total Protein 6.9 6.0 - 8.5 g/dL   Albumin 4.3 3.5 - 5.5 g/dL   Globulin, Total 2.6 1.5 - 4.5 g/dL   Albumin/Globulin Ratio 1.7 1.2 - 2.2   Bilirubin Total 0.3 0.0 - 1.2 mg/dL   Alkaline Phosphatase 61 39 - 117 IU/L   AST 22 0 - 40 IU/L   ALT 15 0 - 44 IU/L  POCT glycosylated hemoglobin (Hb A1C)  Result Value Ref Range   Hemoglobin A1C 6.1

## 2017-03-11 NOTE — Patient Instructions (Addendum)
IF you received an x-ray today, you will receive an invoice from [facility]. Please contact [facility] at [phone] with questions or concerns regarding your invoice.   IF you received labwork today, you will receive an invoice from [facility]. Please contact [facility] at [phone] with questions or concerns regarding your invoice.   Our billing staff will not be able to assist you with questions regarding bills from these companies.  You will be contacted with the lab results as soon as they are available. The fastest way to get your results is to activate your My Chart account. Instructions are located on the last page of this paperwork. If you have not heard from [place] regarding the results in 2 weeks, please contact this office.     Posttraumatic Stress Disorder Posttraumatic stress disorder (PTSD) is a mental health disorder that can occur after a traumatic event, such as a threat to life, serious injury, or sexual violence. Some people who experience these types of events may develop PTSD. Sometimes, PTSD can occur in people who hear about trauma that occurs to a close family member or friend. PTSD can happen to anyone at any age. What increases the risk? This condition is more likely to occur in:  Engineer, manufacturing.  People who have been the victims of, or witness to, a traumatic event, such as: ? Domestic violence. ? Childhood physical or sexual abuse. ? Rape. ? Natural disasters. ? Accidents involving serious injury.  What are the signs or symptoms? Symptoms of PTSD can be grouped into several categories: intrusive, avoidance, increased arousal, and negative moods and thoughts. Intrusive Symptoms This is when a person re-experiences the traumatic event through one or more of the following ways:  Distressing dreams.  Feelings of fear, horror, intense sadness, or anger in response to a reminder of the trauma.  Unwanted distressing  memories while awake.  Physical reactions triggered by reminders of the trauma, such as increased heart rate, shortness of breath, sweating, and shaking.  Having flashbacks, or feelings like you are going through the event again.  Avoidance Symptoms This is when a person avoids thoughts, conversations, people, or activities that are reminders of the trauma. Symptoms may also include:  Decreased interest or participation in daily activities.  Loss of connection or avoidance of other people.  Increased Arousal Symptoms This is when a person is more sensitive or reacts more easily to their environment. Symptoms may include:  Being easily startled.  Careless or self-destructive behavior.  Irritability.  Feeling on edge.  Difficulty concentrating.  Verbal or physical outbursts of anger toward other people or objects.  Difficulty sleeping.  Negative Moods or Thoughts These may include:  Belief that oneself or others are bad.  Regular feelings of fear, horror, anger, sadness, guilt, or shame.  Not being able to remember certain parts of the traumatic event.  Blaming themselves or others for the trauma.  Inability to experience positive emotions, such as happiness or love.  PTSD symptoms may start soon after a frightening event or months or years later. Symptoms last at least one month and tend to disrupt relationships, work, and daily activities. How is this diagnosed? PTSD is diagnosed through an assessment by a mental health professional. Dennis Bast will be asked questions about any traumatic events. You will also be asked about how these events have changed your thoughts, mood, behavior, and ability to function on a daily basis. You may also be asked if you use alcohol or  drugs. How is this treated? Treatment for PTSD may include:  Medicines. Certain medicines can reduce some PTSD symptoms.  Counseling (cognitive behavioral therapy). Talk therapy with a mental health  professional who is experienced in treating PTSD can help.  Eye movement desensitization and reprocessing therapy (EMDR). This type of therapy occurs with a specialized therapist.  Many people with PTSD benefit from a combination of these treatments. If you have other mental health problems, such as depression, alcohol abuse, or drug addiction, your treatment plan will include treatment for these other conditions. Follow these instructions at home: Lifestyle  Find a support group in your community. Often groups are available for [employer] veterans, trauma victims, and family members or caregivers.  Find ways to relax. This may include: ? Breathing exercises. ? Meditation. ? Yoga. ? Listening to quiet music.  Exercise regularly. Try to do at least 30 minutes of physical activity most days of the week.  Try to get 7-9 hours of sleep each night. To help with sleep: ? Keep your bedroom cool and dark. ? Do not eat a heavy meal within one hour of bedtime. ? Do not drink alcohol or caffeinated drinks before bed. ? Avoid screen time, such as television, computers, tablets, or cell phones before bed.  Do not drink alcohol or take illegal drugs.  Look into volunteer opportunities. This can help you feel more connected to your community.  Take steps to help yourself feel safer at home, such as by installing a security system.  Contact a local organization to find out if you are eligible for a service dog.  Keep daily contact with at least one trusted friend or family member.  If your PTSD is affecting your marriage or family, seek help from a family therapist. General instructions  Take over-the-counter and prescription medicines only as told by your health care provider.  Keep all follow-up visits as told by your health care provider and counselor. This is important.  Make sure to let all of your health care providers know you have PTSD. This is especially important if you are having  surgery or need to be admitted to the hospital. Contact a health care provider if:  Your symptoms do not get better or get worse. Get help right away if:  You have thoughts of wanting to harm yourself or others. This information is not intended to replace advice given to you by your health care provider. Make sure you discuss any questions you have with your health care provider. Document Released: 04/05/2001 Document Revised: 11/02/2015 Document Reviewed: 06/26/2015 Elsevier Interactive Patient Education  [employer].

## 2017-03-12 LAB — COMPREHENSIVE METABOLIC PANEL
ALT: 15 IU/L (ref 0–44)
AST: 22 IU/L (ref 0–40)
Albumin/Globulin Ratio: 1.7 (ref 1.2–2.2)
Albumin: 4.3 g/dL (ref 3.5–5.5)
Alkaline Phosphatase: 61 IU/L (ref 39–117)
BILIRUBIN TOTAL: 0.3 mg/dL (ref 0.0–1.2)
BUN/Creatinine Ratio: 13 (ref 9–20)
BUN: 17 mg/dL (ref 6–24)
CHLORIDE: 102 mmol/L (ref 96–106)
CO2: 26 mmol/L (ref 20–29)
Calcium: 9.2 mg/dL (ref 8.7–10.2)
Creatinine, Ser: 1.27 mg/dL (ref 0.76–1.27)
GFR, EST AFRICAN AMERICAN: 73 mL/min/{1.73_m2} (ref >59)
GFR, EST NON AFRICAN AMERICAN: 63 mL/min/{1.73_m2} (ref >59)
GLUCOSE: 85 mg/dL (ref 65–99)
Globulin, Total: 2.6 g/dL (ref 1.5–4.5)
Potassium: 4.8 mmol/L (ref 3.5–5.2)
Sodium: 141 mmol/L (ref 134–144)
TOTAL PROTEIN: 6.9 g/dL (ref 6.0–8.5)

## 2017-03-18 ENCOUNTER — Ambulatory Visit (INDEPENDENT_AMBULATORY_CARE_PROVIDER_SITE_OTHER): Payer: BC Managed Care – PPO | Admitting: Family Medicine

## 2017-03-18 DIAGNOSIS — R7401 Elevation of levels of liver transaminase levels: Secondary | ICD-10-CM

## 2017-03-18 DIAGNOSIS — R740 Nonspecific elevation of levels of transaminase and lactic acid dehydrogenase [LDH]: Secondary | ICD-10-CM

## 2017-03-18 DIAGNOSIS — E119 Type 2 diabetes mellitus without complications: Secondary | ICD-10-CM

## 2017-03-18 NOTE — Progress Notes (Signed)
Lab visit

## 2017-03-19 LAB — LIPID PANEL
CHOLESTEROL TOTAL: 233 mg/dL — AB (ref 100–199)
Chol/HDL Ratio: 3.8 ratio (ref 0.0–5.0)
HDL: 62 mg/dL (ref >39)
LDL Calculated: 155 mg/dL — ABNORMAL HIGH (ref 0–99)
Triglycerides: 78 mg/dL (ref 0–149)
VLDL Cholesterol Cal: 16 mg/dL (ref 5–40)

## 2017-04-11 ENCOUNTER — Ambulatory Visit (INDEPENDENT_AMBULATORY_CARE_PROVIDER_SITE_OTHER): Payer: BC Managed Care – PPO | Admitting: Pharmacist

## 2017-04-11 DIAGNOSIS — B1710 Acute hepatitis C without hepatic coma: Secondary | ICD-10-CM

## 2017-04-11 NOTE — Progress Notes (Signed)
HPI: Alejandro [name] is a 56 y.o. male who presents to the pharmacy clinic today for his end of therapy visit after completing 12 weeks of Harvoni.   Lab Results  Component Value Date   HCVGENOTYPE 1b 11/16/2016    Allergies: Allergies  Allergen Reactions  . Tylenol [Acetaminophen] Hypertension    Vitals:    Past Medical History: Past Medical History:  Diagnosis Date  . Diabetes mellitus without complication ([place])   . Fatty liver   . Hepatitis C   . Hypertension   . Tubular adenoma of colon 12/2015    Social History: Social History   Social History  . Marital status: Married    Spouse name: N/A  . Number of children: N/A  . Years of education: N/A   Social History Main Topics  . Smoking status: Former Research scientist (life sciences)  . Smokeless tobacco: Never Used  . Alcohol use Yes     Comment: occasional wine  . Drug use: No  . Sexual activity: Yes   Other Topics Concern  . Not on file   Social History Narrative   Marital status: married x 24 years      Children: 1 child; no grandchildren      Lives: with wife; daughter in [place]      Employment: Estate agent x 14 years; [employer]      Tobacco: quit smoking after ten years      Alcohol: 3 glasses of wine weekly      Exercise: none             Labs: Hep B S Ab (no units)  Date Value  11/16/2016 POS (A)   Hepatitis B Surface Ag (no units)  Date Value  11/16/2016 NEGATIVE   HCV Ab (no units)  Date Value  08/26/2015 REACTIVE (A)    Lab Results  Component Value Date   HCVGENOTYPE 1b 11/16/2016    Hepatitis C RNA quantitative Latest Ref Rng & Units 02/07/2017 08/26/2015  HCV Quantitative NOT DETECTED IU/mL <15 NOT DETECTED 956,997(H)  HCV Quantitative Log NOT DETECTED Log IU/mL <1.18 NOT DETECTED 5.98(H)    AST  Date Value  03/11/2017 22 IU/L  11/16/2016 87 U/L (H)  08/13/2016 84 IU/L (H)   ALT  Date Value  03/11/2017 15 IU/L  11/16/2016 87 U/L  (H)  11/16/2016 86 U/L (H)  08/13/2016 94 IU/L (H)   INR (no units)  Date Value  11/16/2016 1.0    CrCl: CrCl cannot be calculated (Patient's most recent lab result is older than the maximum 21 days allowed.).  Fibrosis Score: F2  as assessed by Fibrosure  Child-Pugh Score: A  Previous Treatment Regimen: Harvoni  Assessment: Alejandro [name] presents to the clinic today for his end of therapy follow-up appointment. He completed his last dose of Harvoni about a week ago after starting on 6/18. He reports that never missed any doses and the latest he ever was with taking a dose was 45 minutes. He had no problems with side effects. We will collect a repeat viral load and CMET today and follow-up with him in December to ensure clinical cure.   We also offered Ernie a flu shot today which he declined.   Recommendations: - Repeat HCV Quant viral load today and CMET - F/U with lab for final viral load on 12/19  Susa Raring, Pharm.D PGY2 [place] [facility] 04/11/2017, 8:58 AM

## 2017-04-13 LAB — COMPREHENSIVE METABOLIC PANEL
AG RATIO: 1.8 (calc) (ref 1.0–2.5)
ALT: 14 U/L (ref 9–46)
AST: 18 U/L (ref 10–35)
Albumin: 4.4 g/dL (ref 3.6–5.1)
Alkaline phosphatase (APISO): 56 U/L (ref 40–115)
BILIRUBIN TOTAL: 0.4 mg/dL (ref 0.2–1.2)
BUN: 13 mg/dL (ref 7–25)
CALCIUM: 9.4 mg/dL (ref 8.6–10.3)
CHLORIDE: 102 mmol/L (ref 98–110)
CO2: 26 mmol/L (ref 20–32)
Creat: 1 mg/dL (ref 0.70–1.33)
GLOBULIN: 2.5 g/dL (ref 1.9–3.7)
GLUCOSE: 120 mg/dL — AB (ref 65–99)
Potassium: 4.1 mmol/L (ref 3.5–5.3)
SODIUM: 136 mmol/L (ref 135–146)
Total Protein: 6.9 g/dL (ref 6.1–8.1)

## 2017-04-13 LAB — HEPATITIS C RNA QUANTITATIVE
HCV Quantitative Log: <1.18 Log IU/mL
HCV RNA, PCR, QN: <15 IU/mL

## 2017-06-23 ENCOUNTER — Telehealth: Payer: Self-pay | Admitting: Family Medicine

## 2017-06-23 ENCOUNTER — Ambulatory Visit: Payer: Self-pay | Admitting: *Deleted

## 2017-06-23 NOTE — Telephone Encounter (Signed)
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 Pt's contact number is [phone]; unable to reach [facility] pool by phone; will route high priority to office; please call pt back with further instruction.

## 2017-06-24 ENCOUNTER — Ambulatory Visit: Payer: BC Managed Care – PPO | Admitting: Osteopathic Medicine

## 2017-06-24 ENCOUNTER — Encounter: Payer: Self-pay | Admitting: Osteopathic Medicine

## 2017-06-24 ENCOUNTER — Ambulatory Visit (INDEPENDENT_AMBULATORY_CARE_PROVIDER_SITE_OTHER): Payer: BC Managed Care – PPO

## 2017-06-24 ENCOUNTER — Other Ambulatory Visit: Payer: Self-pay

## 2017-06-24 ENCOUNTER — Other Ambulatory Visit: Payer: Self-pay | Admitting: Family Medicine

## 2017-06-24 ENCOUNTER — Other Ambulatory Visit: Payer: Self-pay | Admitting: Physician Assistant

## 2017-06-24 VITALS — BP 164/78 | HR 85 | Temp 98.3°F | Resp 16 | Ht 70.47 in | Wt 214.0 lb

## 2017-06-24 DIAGNOSIS — I10 Essential (primary) hypertension: Secondary | ICD-10-CM | POA: Diagnosis not present

## 2017-06-24 DIAGNOSIS — R05 Cough: Secondary | ICD-10-CM

## 2017-06-24 DIAGNOSIS — R053 Chronic cough: Secondary | ICD-10-CM

## 2017-06-24 MED ORDER — LORATADINE 10 MG PO TABS: 10.0000 mg | ORAL_TABLET | Freq: Every day | ORAL | 11 refills | Status: DC

## 2017-06-24 MED ORDER — RANITIDINE HCL 300 MG PO TABS: 300.0000 mg | ORAL_TABLET | Freq: Every day | ORAL | 0 refills | Status: DC

## 2017-06-24 MED ORDER — FLUTICASONE PROPIONATE 50 MCG/ACT NA SUSP: 1.0000 | Freq: Every day | NASAL | 11 refills | Status: DC

## 2017-06-24 NOTE — Progress Notes (Signed)
HPI: Alejandro [name] is a 56 y.o. male with has a past medical history of Diabetes mellitus without complication ([place]), Fatty liver, Hepatitis C, Hypertension, and Tubular adenoma of colon (12/2015).  who presents to Primary Care at [facility] today, 06/24/17,  for chief complaint of:  Chief Complaint  Patient presents with  . Cough    off/on since October, yellowish mucus      . Quality: dry nagging cough w/ occasional phlegm production . Severity: mild but bothersome . Duration: 2 months or so . Timing: seems to depend on which job site he's at. Possible allergy component or mold?  . Context: former smoker, mostly marijuana. HTN but not on ACE inhibitor  . Assoc signs/symptoms: no weight loss, no fever/chills. Some mild GERD     Past medical, surgical, social and family history reviewed:  Patient Active Problem List   Diagnosis Date Noted  . Essential hypertension 03/11/2017  . Diabetes ([place]) 09/14/2014  . Gallstones 09/14/2014  . Chronic hepatitis C virus infection ([place]) 12/01/2008  . ANEMIA, IRON DEFICIENCY 12/01/2008  . BLOOD IN STOOL 12/01/2008    Past Surgical History:  Procedure Laterality Date  . COLONOSCOPY      Social History   Tobacco Use  . Smoking status: Former Research scientist (life sciences)  . Smokeless tobacco: Never Used  Substance Use Topics  . Alcohol use: Yes    Comment: occasional wine    Family History  Problem Relation Age of Onset  . Heart disease Mother 7       CAD/AMI  . Diabetes Mother   . Hypertension Mother   . Diabetes Maternal Grandfather   . Diabetes Paternal Grandfather   . Diabetes Father   . Diabetes Sister   . Hypertension Sister   . Hypertension Brother   . Diabetes Brother   . Colon cancer Neg Hx   . Esophageal cancer Neg Hx   . Stomach cancer Neg Hx   . Rectal cancer Neg Hx      Current medication list and allergy/intolerance information reviewed:    Current Outpatient Medications  Medication Sig Dispense Refill  . amLODipine  (NORVASC) 5 MG tablet Take 1 tablet (5 mg total) by mouth daily. 90 tablet 0  . Blood Glucose Monitoring Suppl (ONE TOUCH ULTRA 2) w/Device KIT USE AS DIRECTED TO TEST BLOOD SUGAR IN THE MORNING 1 each 0  . Blood Glucose Monitoring Suppl KIT 1 application by Does not apply route every morning. 100 each 3  . Blood Pressure Monitoring (BLOOD PRESSURE CUFF) MISC Use as directed 1 each 0  . glucose blood (ONE TOUCH ULTRA TEST) test strip Check blood sugar twice a day. 200 each 4  . hydrochlorothiazide (HYDRODIURIL) 25 MG tablet Take 1 tablet (25 mg total) by mouth daily. 90 tablet 3  . Ledipasvir-Sofosbuvir (HARVONI) 90-400 MG TABS Take 1 tablet by mouth daily. 28 tablet 2  . metFORMIN (GLUCOPHAGE) 500 MG tablet Take 1 tablet (500 mg total) by mouth 2 (two) times daily with a meal. 180 tablet 3   No current facility-administered medications for this visit.     Allergies  Allergen Reactions  . Tylenol [Acetaminophen] Hypertension      Review of Systems:  Constitutional:  No  fever, no chills, No recent illness, No unintentional weight changes. No significant fatigue.   HEENT: No  headache, no vision change, no hearing change, No sore throat, No  sinus pressure  Cardiac: No  chest pain, No  pressure  Respiratory:  No  shortness  of breath. +Cough  Gastrointestinal: No  abdominal pain, No  nausea,   Skin: No  Rash  Neurologic: No  weakness, No  dizziness   Exam:  BP (!) 164/78   Pulse 85   Temp 98.3 F (36.8 C)   Resp 16   Ht 5' 10.47" (1.79 m)   Wt 214 lb (97.1 kg)   SpO2 99%   BMI 30.30 kg/m    Constitutional: VS see above. General Appearance: alert, well-developed, well-nourished, NAD  Eyes: Normal lids and conjunctive, non-icteric sclera  Ears, Nose, Mouth, Throat: MMM, Normal external inspection ears/nares/mouth/lips/gums. TM normal bilaterally. Pharynx/tonsils no erythema, no exudate. Nasal mucosa normal.   Neck: No masses, trachea midline. No thyroid enlargement.  No tenderness/mass appreciated. No lymphadenopathy  Respiratory: Normal respiratory effort. no wheeze, no rhonchi, no rales  Cardiovascular: S1/S2 normal, no murmur, no rub/gallop auscultated. RRR.   Skin: warm, dry  Psychiatric: Normal judgment/insight. Normal mood and affect. Oriented x3.     CXR personally reviewed. Radiology over-read as below   Dg Chest 2 View  Result Date: 06/24/2017 CLINICAL DATA:  Persistent cough EXAM: CHEST  2 VIEW COMPARISON:  Chest CT August 30, 2011. Thoracic spine x-rays February 26, 2012. FINDINGS: There is a nodule in the left perihilar region which is unchanged since 2013. Previous CT imaging demonstrated that the nodules calcified. No new nodules. No masses or infiltrates. The heart, hila, and mediastinum are normal. No pneumothorax. No other acute abnormalities. IMPRESSION: No active cardiopulmonary disease. Electronically Signed   By: Dorise Bullion III M.D   On: 06/24/2017 14:52    PFT INTERPRETATION  FEV1/FVC >70 *AND*  FEV1 >80% predicted  = NORMAL SPIROMETRY NORMAL? yes    ASSESSMENT/PLAN: At this point, I think persistent cough most likely due to irritant exposure in the workplace, GERD, postnasal drip. WIll trial treatment as below, would consider empiric ICS if no better but spirometry ok    Persistent cough - Plan: DG Chest 2 View, Spirometry: Pre & Post Eval  Essential hypertension   Meds ordered this encounter  Medications  . fluticasone (FLONASE) 50 MCG/ACT nasal spray    Sig: Place 1-2 sprays into both nostrils daily.    Dispense:  16 g    Refill:  11  . loratadine (CLARITIN) 10 MG tablet    Sig: Take 1 tablet (10 mg total) by mouth daily.    Dispense:  30 tablet    Refill:  11  . ranitidine (ZANTAC) 300 MG tablet    Sig: Take 1 tablet (300 mg total) by mouth at bedtime.    Dispense:  90 tablet    Refill:  0       Patient Instructions   Chest Xray and lung function test are normal - no suspicion for COPD based on  results today.   You are not on any medications which would cause chronic cough either.  I suspect possible allergies or acid reflux causing this cough.  I would start treatment with Flonase nasal spray +/- Claritin/Allegra/Zyrtec daily and an antacid, I have sent these medications into the pharmacy.  If after a few weeks, your symptoms persist, we may want to go ahead and try an inhaler even though the lung function tests today were fine.  Call us if you are not any better after 2-4 weeks, and we will go ahead and send an inhaler into the pharmacy for you.  Inhaler is not helping, we may need to have you follow-up in the office  to recheck things and discuss further testing.           IF you received an x-ray today, you will receive an invoice from [facility]. Please contact [facility] at [phone] with questions or concerns regarding your invoice.   IF you received labwork today, you will receive an invoice from [facility]. Please contact [facility] at [phone] with questions or concerns regarding your invoice.   Our billing staff will not be able to assist you with questions regarding bills from these companies.  You will be contacted with the lab results as soon as they are available. The fastest way to get your results is to activate your My Chart account. Instructions are located on the last page of this paperwork. If you have not heard from [place] regarding the results in 2 weeks, please contact this office.         Visit summary with medication list and pertinent instructions was printed for patient to review. All questions at time of visit were answered - patient instructed to contact office with any additional concerns. ER/RTC precautions were reviewed with the patient. Follow-up plan: Return in about 4 weeks (around 07/22/2017) for recheck cough, sooner if needed.    Please note: voice recognition software was used to produce this document, and typos  may escape review. Please contact me for any needed clarifications.

## 2017-06-24 NOTE — Patient Instructions (Addendum)
Chest Xray and lung function test are normal - no suspicion for COPD based on results today.   You are not on any medications which would cause chronic cough either.  I suspect possible allergies or acid reflux causing this cough.  I would start treatment with Flonase nasal spray +/- Claritin/Allegra/Zyrtec daily and an antacid, I have sent these medications into the pharmacy.  If after a few weeks, your symptoms persist, we may want to go ahead and try an inhaler even though the lung function tests today were fine.  Call us if you are not any better after 2-4 weeks, and we will go ahead and send an inhaler into the pharmacy for you.  Inhaler is not helping, we may need to have you follow-up in the office to recheck things and discuss further testing.           IF you received an x-ray today, you will receive an invoice from [facility]. Please contact [facility] at [phone] with questions or concerns regarding your invoice.   IF you received labwork today, you will receive an invoice from [facility]. Please contact [facility] at [phone] with questions or concerns regarding your invoice.   Our billing staff will not be able to assist you with questions regarding bills from these companies.  You will be contacted with the lab results as soon as they are available. The fastest way to get your results is to activate your My Chart account. Instructions are located on the last page of this paperwork. If you have not heard from [place] regarding the results in 2 weeks, please contact this office.

## 2017-06-29 ENCOUNTER — Telehealth: Payer: Self-pay | Admitting: Family Medicine

## 2017-06-29 NOTE — Telephone Encounter (Signed)
Pt called with questions about BP medicines. Pt states that he doesnt remember being on amlodipine and needs clarification on BP meds. Office notes 03/11/17 indicate he was started on amlodipine.

## 2017-07-06 NOTE — Telephone Encounter (Signed)
Phone call to patient to clarify. Patient not available at time of call. Will attempt call again later.

## 2017-07-07 ENCOUNTER — Other Ambulatory Visit: Payer: Self-pay

## 2017-07-07 MED ORDER — BLOOD PRESSURE CUFF MISC: 0 refills | Status: AC

## 2017-07-07 NOTE — Telephone Encounter (Signed)
Spoke with patient.  He admitted he read the BP bottle wrong.  He is taking the amlodipine.  Advised pt to record the BP readings and bring with him to appts.  Pt states he will make appt for Feb for annual visit.  Pt advised he needs BP cuff.  Prior order for cuff was dated 06/25/2016 - resent today.

## 2017-07-12 ENCOUNTER — Ambulatory Visit (INDEPENDENT_AMBULATORY_CARE_PROVIDER_SITE_OTHER): Payer: BC Managed Care – PPO | Admitting: Pharmacist

## 2017-07-12 DIAGNOSIS — B182 Chronic viral hepatitis C: Secondary | ICD-10-CM | POA: Diagnosis not present

## 2017-07-12 NOTE — Progress Notes (Signed)
[place] for Infectious Disease Pharmacy Visit  HPI: Alejandro [name] is a 56 y.o. male who presents to the [facility] for his Hep C cure visit.  He completed 12 weeks of Harvoni back in September.  Patient Active Problem List   Diagnosis Date Noted  . Essential hypertension 03/11/2017  . Diabetes ([place]) 09/14/2014  . Gallstones 09/14/2014  . Chronic hepatitis C virus infection ([place]) 12/01/2008  . ANEMIA, IRON DEFICIENCY 12/01/2008  . BLOOD IN STOOL 12/01/2008      Medication List        Accurate as of 07/12/17  2:54 PM. Always use your most recent med list.          amLODipine 5 MG tablet Commonly known as:  NORVASC Take 1 tablet (5 mg total) by mouth daily. Office visit needed   * Blood Glucose Monitoring Suppl Kit 1 application by Does not apply route every morning.   * ONE TOUCH ULTRA 2 w/Device Kit USE AS DIRECTED TO TEST BLOOD SUGAR IN THE MORNING   Blood Pressure Cuff Misc Use as directed   fluticasone 50 MCG/ACT nasal spray Commonly known as:  FLONASE Place 1-2 sprays into both nostrils daily.   hydrochlorothiazide 25 MG tablet Commonly known as:  HYDRODIURIL Take 1 tablet (25 mg total) by mouth daily.   loratadine 10 MG tablet Commonly known as:  CLARITIN Take 1 tablet (10 mg total) by mouth daily.   metFORMIN 500 MG tablet Commonly known as:  GLUCOPHAGE Take 1 tablet (500 mg total) by mouth 2 (two) times daily with a meal.   ONETOUCH VERIO test strip Generic drug:  glucose blood USE ONE STRIP TO CHECK GLUCOSE TWICE DAILY   ranitidine 300 MG tablet Commonly known as:  ZANTAC Take 1 tablet (300 mg total) by mouth at bedtime.      * This list has 2 medication(s) that are the same as other medications prescribed for you. Read the directions carefully, and ask your doctor or other care provider to review them with you.          Allergies: Allergies  Allergen Reactions  . Tylenol [Acetaminophen] Hypertension    Past Medical  History: Past Medical History:  Diagnosis Date  . Diabetes mellitus without complication ([place])   . Fatty liver   . Hepatitis C   . Hypertension   . Tubular adenoma of colon 12/2015    Social History: Social History   Socioeconomic History  . Marital status: Married    Spouse name: Not on file  . Number of children: Not on file  . Years of education: Not on file  . Highest education level: Not on file  Social Needs  . Financial resource strain: Not on file  . Food insecurity - worry: Not on file  . Food insecurity - inability: Not on file  . Transportation needs - medical: Not on file  . Transportation needs - non-medical: Not on file  Occupational History  . Not on file  Tobacco Use  . Smoking status: Former Research scientist (life sciences)  . Smokeless tobacco: Never Used  Substance and Sexual Activity  . Alcohol use: Yes    Comment: occasional wine  . Drug use: No  . Sexual activity: Yes  Other Topics Concern  . Not on file  Social History Narrative   Marital status: married x 24 years      Children: 1 child; no grandchildren      Lives: with wife; daughter in [facility]  Bone Marrow      Employment: Estate agent x 14 years; [employer] floor tech      Tobacco: quit smoking after ten years      Alcohol: 3 glasses of wine weekly      Exercise: none             Labs: Hep B S Ab (no units)  Date Value  11/16/2016 POS (A)   Hepatitis B Surface Ag (no units)  Date Value  11/16/2016 NEGATIVE   HCV Ab (no units)  Date Value  08/26/2015 REACTIVE (A)    Lab Results  Component Value Date   HCVGENOTYPE 1b 11/16/2016    Hepatitis C RNA quantitative Latest Ref Rng & Units 04/11/2017 02/07/2017 08/26/2015  HCV Quantitative NOT DETECTED IU/mL - <15 NOT DETECTED 956,997(H)  HCV Quantitative Log NOT DETECT Log IU/mL <1.18 NOT DETECTED <1.18 NOT DETECTED 5.98(H)    AST  Date Value  04/11/2017 18 U/L  03/11/2017 22 IU/L  11/16/2016 87 U/L (H)   ALT    Date Value  04/11/2017 14 U/L  03/11/2017 15 IU/L  11/16/2016 87 U/L (H)  11/16/2016 86 U/L (H)   INR (no units)  Date Value  11/16/2016 1.0    Fibrosis Score: F2 as assessed by fibrosure   Assessment: Alejandro [name] is here today to follow-up for his Hep C cure visit. He completed 12 weeks of Harvoni back in September with no issues. He missed no doses.  I spent time educating him on the risk of re-infection.  I will get SVR12 labs today and call him with the results.   Plan: - SVR12 Hep C viral load today - Call with results   L. , PharmD, [place], [place] for Infectious Disease 07/12/2017, 2:54 PM

## 2017-07-14 LAB — HEPATITIS C RNA QUANTITATIVE
HCV Quantitative Log: <1.18 Log IU/mL
HCV RNA, PCR, QN: NOT DETECTED [IU]/mL

## 2017-07-26 ENCOUNTER — Telehealth: Payer: Self-pay | Admitting: Pharmacist

## 2017-07-26 NOTE — Telephone Encounter (Signed)
Called Alejandro [name] to let him know that his Hepatitis C infection is cured.

## 2017-10-23 ENCOUNTER — Encounter: Payer: Self-pay | Admitting: Physician Assistant

## 2017-10-24 ENCOUNTER — Other Ambulatory Visit: Payer: Self-pay | Admitting: Family Medicine

## 2017-10-24 DIAGNOSIS — E1165 Type 2 diabetes mellitus with hyperglycemia: Secondary | ICD-10-CM

## 2017-12-04 ENCOUNTER — Telehealth: Payer: Self-pay | Admitting: Family Medicine

## 2017-12-04 NOTE — Telephone Encounter (Signed)
Copied from [place] [phone]. Topic: Quick Communication - Rx Refill/Question >> Dec 04, 2017 12:28 PM Synthia Innocent wrote: Medication: amLODipine (NORVASC) 5 MG tablet, metFORMIN (GLUCOPHAGE) 500 MG tablet and hydrochlorothiazide (HYDRODIURIL) 25 MG tablet 90 day supply Has the patient contacted their pharmacy? Yes.   (Agent: If no, request that the patient contact the pharmacy for the refill.) Preferred Pharmacy (with phone number or street name): [facility] on [address]  Agent: Please be advised that RX refills may take up to 3 business days. We ask that you follow-up with your pharmacy.

## 2017-12-05 NOTE — Telephone Encounter (Signed)
Pt requesting a 90 day supply of his amlodipine 5 mg tab, hydrochlorothiazide 25 MG tab and Metformin 500 mg tab.  Attempted to call patient to let him know that his meds have already been refilled but will let his provider know for the next refill.  Provider  Dr. Dwana Curd LOV 06/24/17 for persistent cough and 03/11/17 for diabetes and hypertension.  Last refilled was 10/24/17

## 2017-12-06 ENCOUNTER — Other Ambulatory Visit: Payer: Self-pay | Admitting: Family Medicine

## 2017-12-06 ENCOUNTER — Telehealth: Payer: Self-pay

## 2017-12-06 DIAGNOSIS — E1165 Type 2 diabetes mellitus with hyperglycemia: Secondary | ICD-10-CM

## 2017-12-06 NOTE — Telephone Encounter (Signed)
Copied from [phone]. Topic: Quick Communication - Rx Refill/Question >> Dec 04, 2017 12:28 PM Synthia Innocent wrote: Medication: amLODipine (NORVASC) 5 MG tablet, metFORMIN (GLUCOPHAGE) 500 MG tablet and hydrochlorothiazide (HYDRODIURIL) 25 MG tablet Has the patient contacted their pharmacy? Yes.   (Agent: If no, request that the patient contact the pharmacy for the refill.) Preferred Pharmacy (with phone number or street name): [facility] on [address]  Agent: Please be advised that RX refills may take up to 3 business days. We ask that you follow-up with your pharmacy. >> Dec 06, 2017  9:29 AM Percell Belt A wrote: Pt called to check on this refill request?

## 2017-12-06 NOTE — Telephone Encounter (Signed)
Contacted pt regarding prescription refill request; see note from 12/06/17 that pt needs physical and labs;pt says that weekend appointments are better for himbecause he works during the week until late in the evenng pt scheduled appointment 12/30/17 at 59 with Dr [name], [place] [address]; he verbalizes understanding; will route to office for notification of this encounter.

## 2017-12-06 NOTE — Telephone Encounter (Signed)
PEC message- pt wants 90 day refill on Norvasc, Metformin and Hydrodiuril  Attempted to call pt:  No home number working, Cell will not accept voice mail. HIPAA does not have another number we can call.  Noted on chart pt was to return in Feb for annual physical and labs.

## 2017-12-18 ENCOUNTER — Encounter: Payer: Self-pay | Admitting: Family Medicine

## 2017-12-30 ENCOUNTER — Ambulatory Visit: Payer: Self-pay | Admitting: Family Medicine

## 2018-01-06 ENCOUNTER — Encounter: Payer: Self-pay | Admitting: Urgent Care

## 2018-01-06 ENCOUNTER — Other Ambulatory Visit: Payer: Self-pay

## 2018-01-06 ENCOUNTER — Ambulatory Visit: Payer: BC Managed Care – PPO | Admitting: Urgent Care

## 2018-01-06 VITALS — BP 136/90 | HR 72 | Temp 98.3°F | Resp 16 | Ht 69.0 in | Wt 215.0 lb

## 2018-01-06 DIAGNOSIS — E119 Type 2 diabetes mellitus without complications: Secondary | ICD-10-CM | POA: Diagnosis not present

## 2018-01-06 DIAGNOSIS — I10 Essential (primary) hypertension: Secondary | ICD-10-CM

## 2018-01-06 MED ORDER — METFORMIN HCL 500 MG PO TABS: 500.0000 mg | ORAL_TABLET | Freq: Two times a day (BID) | ORAL | 0 refills | Status: DC

## 2018-01-06 MED ORDER — HYDROCHLOROTHIAZIDE 25 MG PO TABS: 25.0000 mg | ORAL_TABLET | Freq: Every day | ORAL | 0 refills | Status: DC

## 2018-01-06 MED ORDER — AMLODIPINE BESYLATE 5 MG PO TABS: 5.0000 mg | ORAL_TABLET | Freq: Every day | ORAL | 0 refills | Status: DC

## 2018-01-06 NOTE — Patient Instructions (Addendum)
Please make sure that you are eating less or limit your carbohydrates such as rice, potatoes, breads, pasta, pizza, sodas, beer, sweet tea, fruit juices. Exercise can also help with your blood sugar. You can instead eat more salads, fruits, vegetables, whole grains, wheat, oats.     Salads - kale, spinach, cabbage, spring mix; use seeds like pumpkin seeds or sunflower seeds; you can also use 1-2 hard boiled eggs. Fruits - avocadoes, berries (blueberries, raspberries, blackberries), apples, oranges, pomegranate, grapefruit Seeds - quinoa, chia seeds; you can also incorporate oatmeal Vegetables - aspargus, cauliflower, broccoli, green beans, brussel spouts, bell peppers; stay away from starchy vegetables like potatoes, carrots, peas     Diabetes Mellitus and Nutrition When you have diabetes (diabetes mellitus), it is very important to have healthy eating habits because your blood sugar (glucose) levels are greatly affected by what you eat and drink. Eating healthy foods in the appropriate amounts, at about the same times every day, can help you:  Control your blood glucose.  Lower your risk of heart disease.  Improve your blood pressure.  Reach or maintain a healthy weight.  Every person with diabetes is different, and each person has different needs for a meal plan. Your health care provider may recommend that you work with a diet and nutrition specialist (dietitian) to make a meal plan that is best for you. Your meal plan may vary depending on factors such as:  The calories you need.  The medicines you take.  Your weight.  Your blood glucose, blood pressure, and cholesterol levels.  Your activity level.  Other health conditions you have, such as heart or kidney disease.  How do carbohydrates affect me? Carbohydrates affect your blood glucose level more than any other type of food. Eating carbohydrates naturally increases the amount of glucose in your blood. Carbohydrate counting  is a method for keeping track of how many carbohydrates you eat. Counting carbohydrates is important to keep your blood glucose at a healthy level, especially if you use insulin or take certain oral diabetes medicines. It is important to know how many carbohydrates you can safely have in each meal. This is different for every person. Your dietitian can help you calculate how many carbohydrates you should have at each meal and for snack. Foods that contain carbohydrates include:  Bread, cereal, rice, pasta, and crackers.  Potatoes and corn.  Peas, beans, and lentils.  Milk and yogurt.  Fruit and juice.  Desserts, such as cakes, cookies, ice cream, and candy.  How does alcohol affect me? Alcohol can cause a sudden decrease in blood glucose (hypoglycemia), especially if you use insulin or take certain oral diabetes medicines. Hypoglycemia can be a life-threatening condition. Symptoms of hypoglycemia (sleepiness, dizziness, and confusion) are similar to symptoms of having too much alcohol. If your health care provider says that alcohol is safe for you, follow these guidelines:  Limit alcohol intake to no more than 1 drink per day for nonpregnant women and 2 drinks per day for men. One drink equals 12 oz of beer, 5 oz of wine, or 1 oz of hard liquor.  Do not drink on an empty stomach.  Keep yourself hydrated with water, diet soda, or unsweetened iced tea.  Keep in mind that regular soda, juice, and other mixers may contain a lot of sugar and must be counted as carbohydrates.  What are tips for following this plan? Reading food labels  Start by checking the serving size on the label. The amount of  calories, carbohydrates, fats, and other nutrients listed on the label are based on one serving of the food. Many foods contain more than one serving per package.  Check the total grams (g) of carbohydrates in one serving. You can calculate the number of servings of carbohydrates in one serving  by dividing the total carbohydrates by 15. For example, if a food has 30 g of total carbohydrates, it would be equal to 2 servings of carbohydrates.  Check the number of grams (g) of saturated and trans fats in one serving. Choose foods that have low or no amount of these fats.  Check the number of milligrams (mg) of sodium in one serving. Most people should limit total sodium intake to less than 2,300 mg per day.  Always check the nutrition information of foods labeled as "low-fat" or "nonfat". These foods may be higher in added sugar or refined carbohydrates and should be avoided.  Talk to your dietitian to identify your daily goals for nutrients listed on the label. Shopping  Avoid buying canned, premade, or processed foods. These foods tend to be high in fat, sodium, and added sugar.  Shop around the outside edge of the grocery store. This includes fresh fruits and vegetables, bulk grains, fresh meats, and fresh dairy. Cooking  Use low-heat cooking methods, such as baking, instead of high-heat cooking methods like deep frying.  Cook using healthy oils, such as olive, canola, or sunflower oil.  Avoid cooking with butter, cream, or high-fat meats. Meal planning  Eat meals and snacks regularly, preferably at the same times every day. Avoid going long periods of time without eating.  Eat foods high in fiber, such as fresh fruits, vegetables, beans, and whole grains. Talk to your dietitian about how many servings of carbohydrates you can eat at each meal.  Eat 4-6 ounces of lean protein each day, such as lean meat, chicken, fish, eggs, or tofu. 1 ounce is equal to 1 ounce of meat, chicken, or fish, 1 egg, or 1/4 cup of tofu.  Eat some foods each day that contain healthy fats, such as avocado, nuts, seeds, and fish. Lifestyle   Check your blood glucose regularly.  Exercise at least 30 minutes 5 or more days each week, or as told by your health care provider.  Take medicines as told  by your health care provider.  Do not use any products that contain nicotine or tobacco, such as cigarettes and e-cigarettes. If you need help quitting, ask your health care provider.  Work with a Social worker or diabetes educator to identify strategies to manage stress and any emotional and social challenges. What are some questions to ask my health care provider?  Do I need to meet with a diabetes educator?  Do I need to meet with a dietitian?  What number can I call if I have questions?  When are the best times to check my blood glucose? Where to find more information:  American Diabetes Association: diabetes.org/food-and-fitness/food  Academy of Nutrition and Dietetics: PokerClues.dk  [employer] of Diabetes and Digestive and Kidney Diseases (NIH): ContactWire.be Summary  A healthy meal plan will help you control your blood glucose and maintain a healthy lifestyle.  Working with a diet and nutrition specialist (dietitian) can help you make a meal plan that is best for you.  Keep in mind that carbohydrates and alcohol have immediate effects on your blood glucose levels. It is important to count carbohydrates and to use alcohol carefully. This information is not intended to  replace advice given to you by your health care provider. Make sure you discuss any questions you have with your health care provider. Document Released: 04/07/2005 Document Revised: 08/15/2016 Document Reviewed: 08/15/2016 Elsevier Interactive Patient Education  2018 [employer].     IF you received an x-ray today, you will receive an invoice from [facility]. Please contact [facility] at [phone] with questions or concerns regarding your invoice.   IF you received labwork today, you will receive an invoice from [facility]. Please contact [facility] at  [phone] with questions or concerns regarding your invoice.   Our billing staff will not be able to assist you with questions regarding bills from these companies.  You will be contacted with the lab results as soon as they are available. The fastest way to get your results is to activate your My Chart account. Instructions are located on the last page of this paperwork. If you have not heard from [place] regarding the results in 2 weeks, please contact this office.

## 2018-01-06 NOTE — Progress Notes (Signed)
MRN: [id number] DOB: 1961-04-21  Subjective:   Alejandro [name] is a 57 y.o. male presenting for follow up on Hypertension and DM.   HTN - Currently managed amlodipine and hydrochlorothiazide.  Avoids salt in diet.  Patient was previously on lisinopril but states that he was taken off of this medicine. Denies lightheadedness, dizziness, chronic headache, blurred vision, chest pain, shortness of breath, heart racing, palpitations, nausea, vomiting, abdominal pain, hematuria, lower leg swelling. Denies smoking cigarettes or drinking alcohol.   Diabetes - Currently managed with metformin 500mg  BID.  He was previously on atorvastatin but states that his PCP Dr. Brigitte Pulse took him off of this because of treatment for hepatitis C.  He denies any foot concerns.  States that he plans on setting up his office visit with his ophthalmologist very soon and will have them send [place] the report.  Alejandro [name] has a current medication list which includes the following prescription(s): amlodipine, one touch ultra 2, blood glucose monitoring suppl, blood pressure cuff, metformin, onetouch verio, ranitidine, fluticasone, hydrochlorothiazide, loratadine, atorvastatin, and lansoprazole. Also is allergic to tylenol [acetaminophen].  Alejandro [name]  has a past medical history of Diabetes mellitus without complication ([place]), Fatty liver, Hepatitis C, Hypertension, and Tubular adenoma of colon (12/2015). Also  has a past surgical history that includes Colonoscopy.  Objective:   Vitals: BP 136/90 (BP Location: Left Arm, Patient Position: Sitting, Cuff Size: Normal)   Pulse 72   Temp 98.3 F (36.8 C) (Oral)   Resp 16   Ht 5\' 9"  (1.753 m)   Wt 215 lb (97.5 kg)   SpO2 100%   BMI 31.75 kg/m   The 10-year ASCVD risk score Mikey Bussing DC Jr., et al., 2013) is: 23.4%   Values used to calculate the score:     Age: 68 years     Sex: Male     Is Non-Hispanic African American: Yes     Diabetic: Yes     Tobacco smoker: No     Systolic Blood  Pressure: 136 mmHg     Is BP treated: Yes     HDL Cholesterol: 62 mg/dL     Total Cholesterol: 233 mg/dL   Physical Exam  Constitutional: He is oriented to person, place, and time. He appears well-developed and well-nourished.  HENT:  Mouth/Throat: Oropharynx is clear and moist.  Eyes: Right eye exhibits no discharge. Left eye exhibits no discharge. No scleral icterus.  Cardiovascular: Normal rate, regular rhythm and intact distal pulses. Exam reveals no gallop and no friction rub.  No murmur heard. Pulmonary/Chest: No stridor. No respiratory distress. He has no wheezes. He has no rales.  Musculoskeletal: He exhibits no edema.  Neurological: He is alert and oriented to person, place, and time.  Skin: Skin is warm and dry.  Psychiatric: He has a normal mood and affect.    Assessment and Plan :   Benign essential HTN - Plan: Comprehensive metabolic panel, Lipid panel, Thyroid Panel With TSH, Microalbumin / creatinine urine ratio  Type 2 diabetes mellitus without complication, without long-term current use of insulin (HCC) - Plan: [facility] Diabetes Foot Exam, Hemoglobin A1c, Lipid panel, Microalbumin / creatinine urine ratio, metFORMIN (GLUCOPHAGE) 500 MG tablet  Refills provided for amlodipine, hydrochlorothiazide, metformin.  Counseled patient on standards of medical care which include using a statin and ACEi or ARB for renal protection, hypertension.  Patient does not want any changes to his medication, plans on going back to work with his PCP Dr. Brigitte Pulse on this.  60-month  refill was provided and patient will return to clinic for recheck with his PCP.  Jaynee Eagles, PA-C [facility] [phone] 01/06/2018  10:57 AM

## 2018-01-07 LAB — LIPID PANEL
Chol/HDL Ratio: 4.7 ratio (ref 0.0–5.0)
Cholesterol, Total: 232 mg/dL — ABNORMAL HIGH (ref 100–199)
HDL: 49 mg/dL (ref >39)
LDL Calculated: 145 mg/dL — ABNORMAL HIGH (ref 0–99)
TRIGLYCERIDES: 190 mg/dL — AB (ref 0–149)
VLDL Cholesterol Cal: 38 mg/dL (ref 5–40)

## 2018-01-07 LAB — COMPREHENSIVE METABOLIC PANEL
ALT: 13 IU/L (ref 0–44)
AST: 17 IU/L (ref 0–40)
Albumin/Globulin Ratio: 2 (ref 1.2–2.2)
Albumin: 4.9 g/dL (ref 3.5–5.5)
Alkaline Phosphatase: 72 IU/L (ref 39–117)
BILIRUBIN TOTAL: 0.3 mg/dL (ref 0.0–1.2)
BUN/Creatinine Ratio: 13 (ref 9–20)
BUN: 14 mg/dL (ref 6–24)
CALCIUM: 10 mg/dL (ref 8.7–10.2)
CHLORIDE: 97 mmol/L (ref 96–106)
CO2: 27 mmol/L (ref 20–29)
Creatinine, Ser: 1.07 mg/dL (ref 0.76–1.27)
GFR calc Af Amer: 89 mL/min/{1.73_m2} (ref >59)
GFR, EST NON AFRICAN AMERICAN: 77 mL/min/{1.73_m2} (ref >59)
GLUCOSE: 73 mg/dL (ref 65–99)
Globulin, Total: 2.4 g/dL (ref 1.5–4.5)
POTASSIUM: 4.4 mmol/L (ref 3.5–5.2)
Sodium: 139 mmol/L (ref 134–144)
Total Protein: 7.3 g/dL (ref 6.0–8.5)

## 2018-01-07 LAB — MICROALBUMIN / CREATININE URINE RATIO: CREATININE, UR: 51.2 mg/dL

## 2018-01-07 LAB — THYROID PANEL WITH TSH
FREE THYROXINE INDEX: 1.8 (ref 1.2–4.9)
T3 Uptake Ratio: 24 % (ref 24–39)
T4, Total: 7.4 ug/dL (ref 4.5–12.0)
TSH: 2.24 u[IU]/mL (ref 0.450–4.500)

## 2018-01-07 LAB — HEMOGLOBIN A1C
ESTIMATED AVERAGE GLUCOSE: 134 mg/dL
HEMOGLOBIN A1C: 6.3 % — AB (ref 4.8–5.6)

## 2018-01-11 ENCOUNTER — Telehealth: Payer: Self-pay | Admitting: Urgent Care

## 2018-01-11 NOTE — Telephone Encounter (Signed)
Copied from [place] [id number]. Topic: General - Other >> Jan 11, 2018  1:42 PM Boyd Kerbs wrote: Reason for CRM:    pt went to Urgent Care on Saturday and had labs. Asking if results can be mailed to him

## 2018-01-11 NOTE — Telephone Encounter (Signed)
Pt req lab results be sent to him. Not released yet.  Sent to Dr. Brigitte Pulse

## 2018-01-12 ENCOUNTER — Encounter: Payer: Self-pay | Admitting: Urgent Care

## 2018-01-19 NOTE — Telephone Encounter (Signed)
Pt was seen by [facility] who sent letter on 6/21.

## 2018-01-22 IMAGING — DX DG CHEST 2V
2 series · 2 of 2 positions shown · non-contrast
Comparison: Chest CT August 30, 2011. Thoracic spine x-rays February 26, 2012.

CLINICAL DATA: Persistent cough

EXAM:
CHEST  2 VIEW

[chest pa]
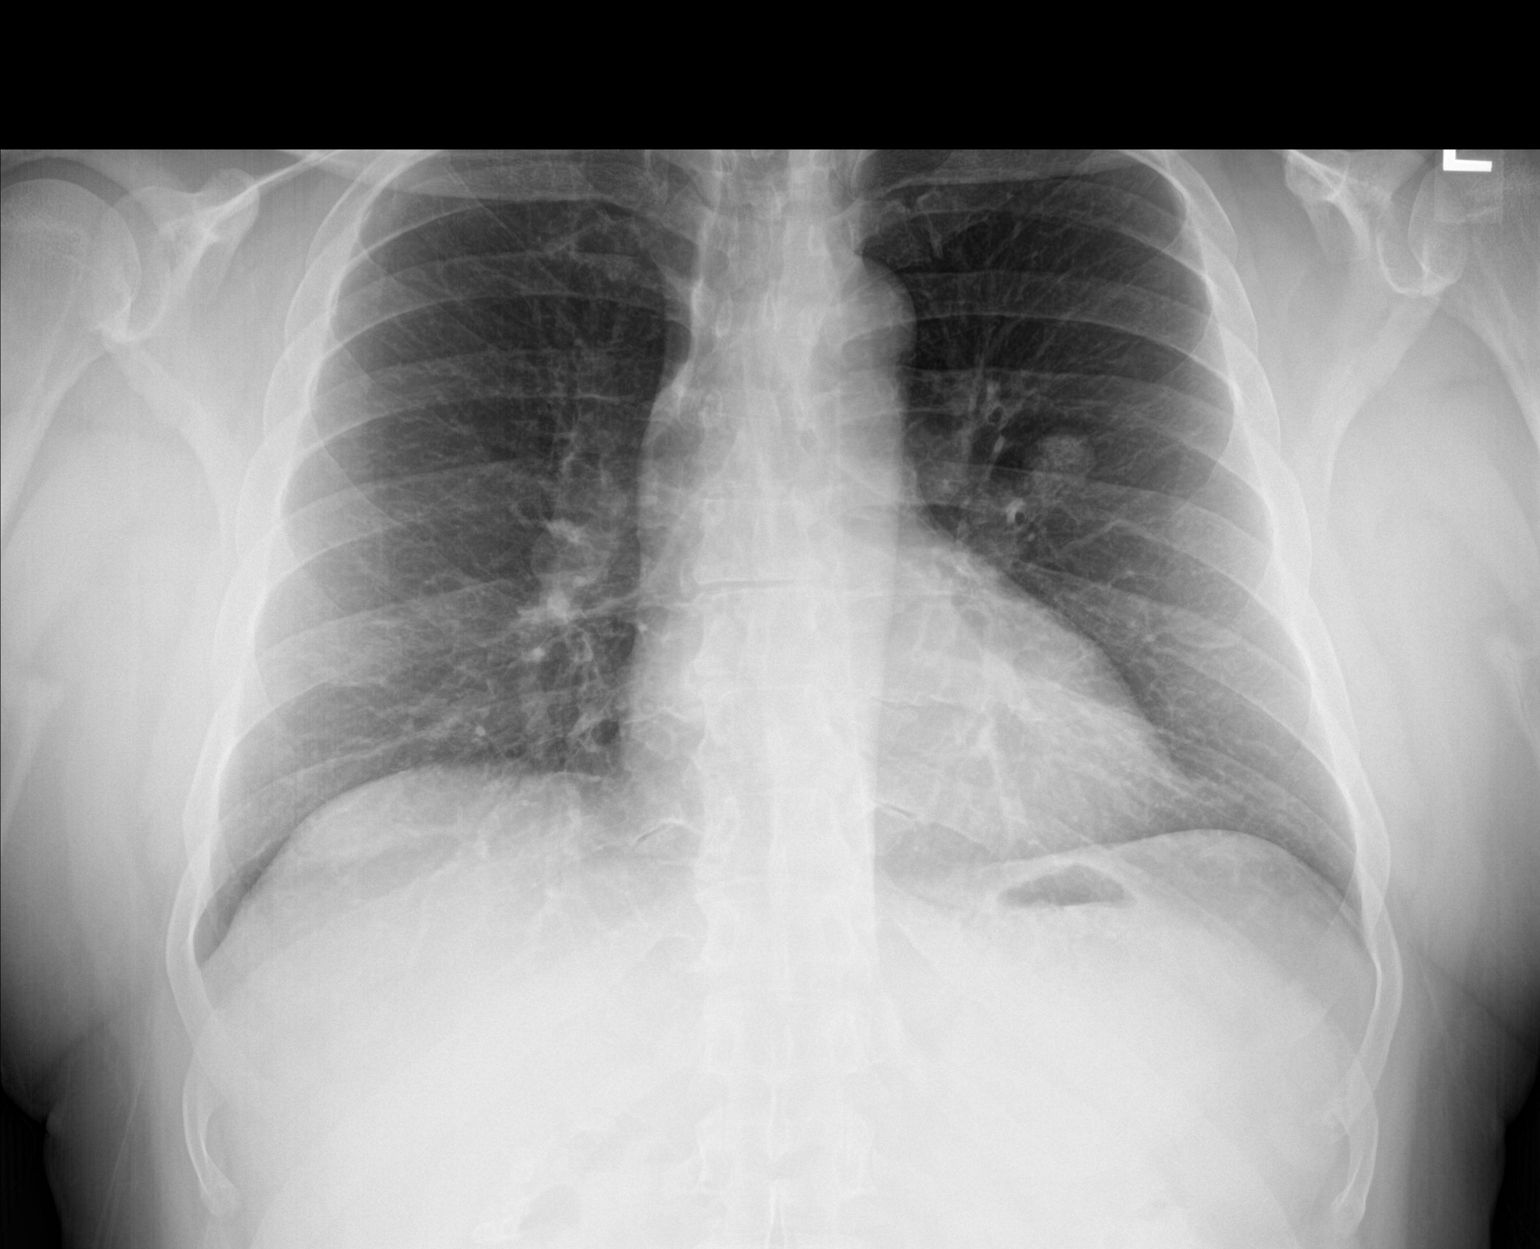

[chest lat]
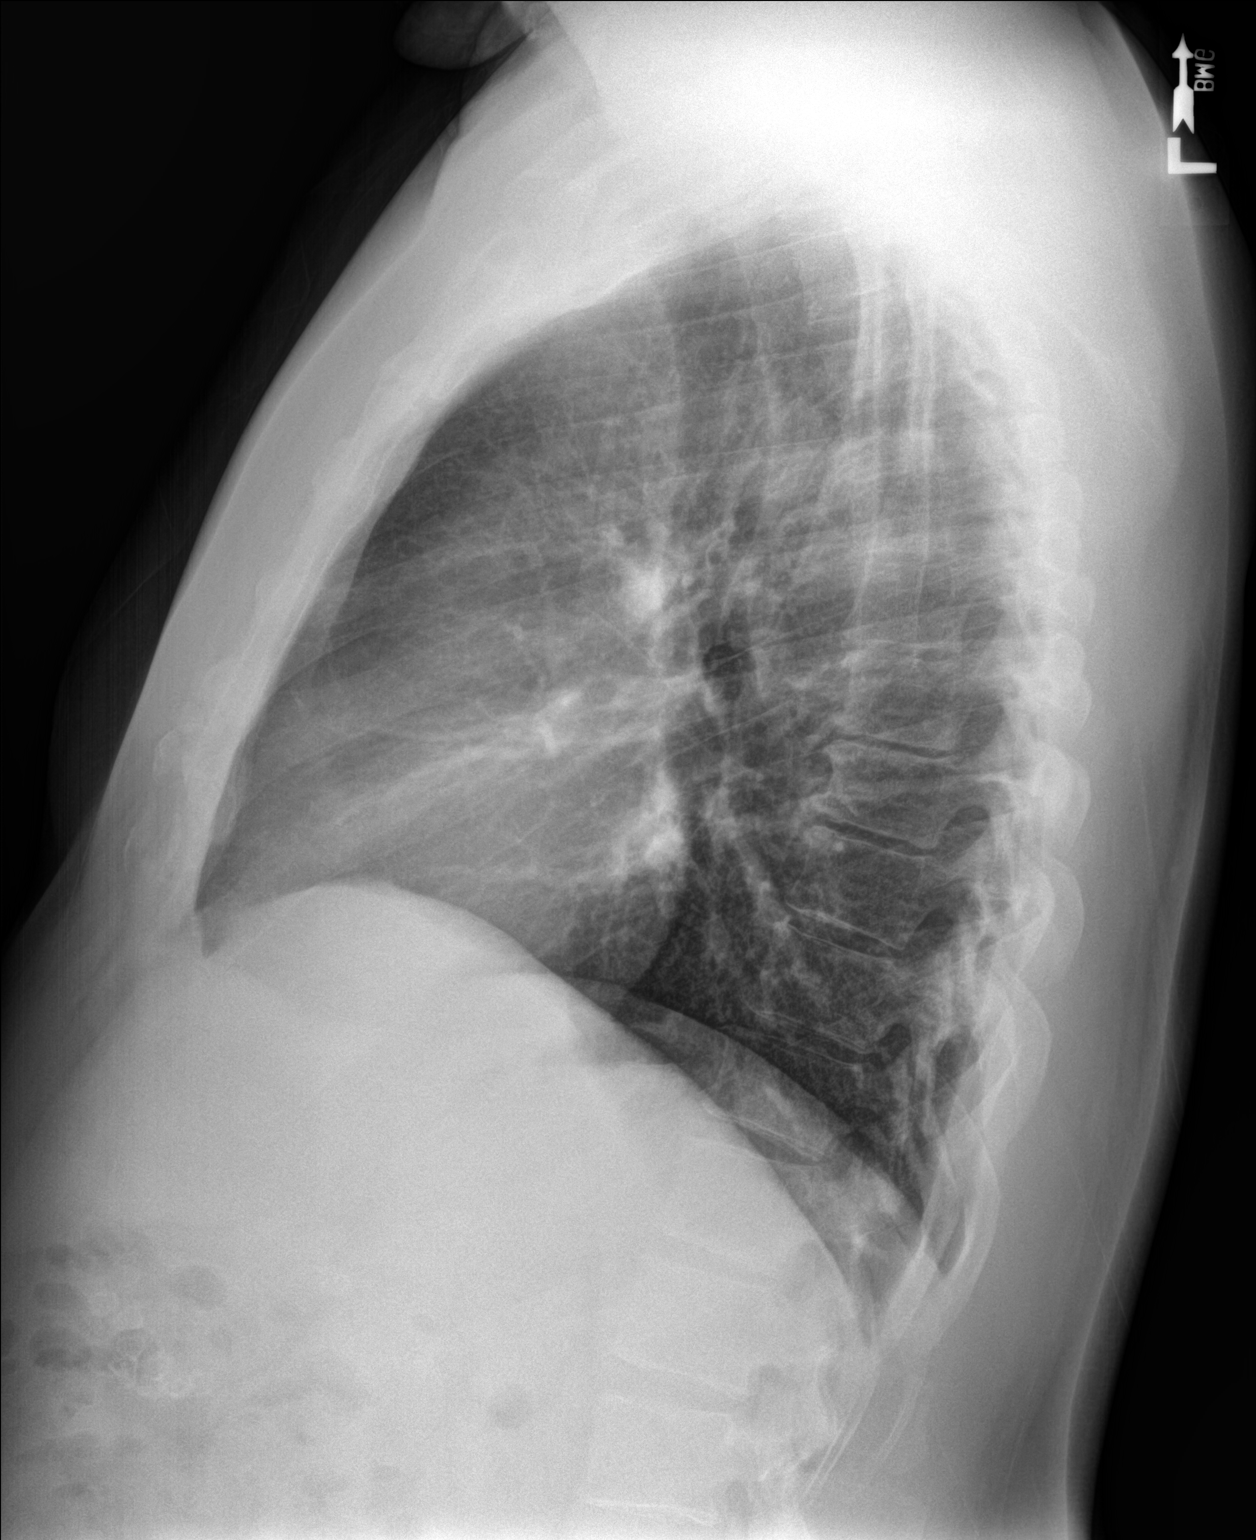

[2 of 2 positions shown; findings below may reference images not displayed]

FINDINGS: There is a nodule in the left perihilar region which is unchanged
since 1628. Previous CT imaging demonstrated that the nodules
calcified. No new nodules. No masses or infiltrates. The heart,
hila, and mediastinum are normal. No pneumothorax. No other acute
abnormalities.
IMPRESSION: No active cardiopulmonary disease.

## 2018-04-11 ENCOUNTER — Ambulatory Visit: Payer: BC Managed Care – PPO | Admitting: Urgent Care

## 2018-04-11 ENCOUNTER — Other Ambulatory Visit: Payer: Self-pay

## 2018-04-11 ENCOUNTER — Encounter: Payer: Self-pay | Admitting: Urgent Care

## 2018-04-11 VITALS — BP 132/82 | HR 70 | Temp 98.0°F | Resp 16 | Ht 70.87 in | Wt 214.0 lb

## 2018-04-11 DIAGNOSIS — I10 Essential (primary) hypertension: Secondary | ICD-10-CM | POA: Diagnosis not present

## 2018-04-11 DIAGNOSIS — E119 Type 2 diabetes mellitus without complications: Secondary | ICD-10-CM

## 2018-04-11 DIAGNOSIS — E782 Mixed hyperlipidemia: Secondary | ICD-10-CM

## 2018-04-11 MED ORDER — HYDROCHLOROTHIAZIDE 25 MG PO TABS: 25.0000 mg | ORAL_TABLET | Freq: Every day | ORAL | 3 refills | Status: DC

## 2018-04-11 MED ORDER — FLUTICASONE PROPIONATE 50 MCG/ACT NA SUSP: 1.0000 | Freq: Every day | NASAL | 11 refills | Status: DC

## 2018-04-11 MED ORDER — AMLODIPINE BESYLATE 5 MG PO TABS: 5.0000 mg | ORAL_TABLET | Freq: Every day | ORAL | 3 refills | Status: DC

## 2018-04-11 MED ORDER — ATORVASTATIN CALCIUM 20 MG PO TABS: 20.0000 mg | ORAL_TABLET | Freq: Every day | ORAL | 3 refills | Status: DC

## 2018-04-11 MED ORDER — METFORMIN HCL 500 MG PO TABS: 500.0000 mg | ORAL_TABLET | Freq: Two times a day (BID) | ORAL | 3 refills | Status: DC

## 2018-04-11 NOTE — Progress Notes (Signed)
    MRN: [id number] DOB: Oct 30, 1960  Subjective:   Alejandro [name] is a 57 y.o. male presenting for medication refills.  Patient needs medication refills for his blood pressure, diabetes.  From his last office visit he was not able to schedule an appointment with his PCP, Dr. Brigitte Pulse.  She has taken a leave of absence and is presenting today to discuss his chronic conditions.  He was supposed to start a cholesterol medicine but he denied not taking this.  Today he reports that he is tried to make significant dietary modifications and feels better overall about improving on his health conditions.  reports that he has quit smoking. He has never used smokeless tobacco. He reports that he drinks alcohol. He reports that he does not use drugs.   Alejandro [name] has a current medication list which includes the following prescription(s): amlodipine, one touch ultra 2, blood glucose monitoring suppl, blood pressure cuff, fluticasone, hydrochlorothiazide, metformin, onetouch verio, atorvastatin, and lansoprazole. Also is allergic to tylenol [acetaminophen].  Alejandro [name]  has a past medical history of Diabetes mellitus without complication ([place]), Fatty liver, Hepatitis C, Hypertension, and Tubular adenoma of colon (12/2015). Also  has a past surgical history that includes Colonoscopy.  Objective:   Vitals: BP 132/82   Pulse 70   Temp 98 F (36.7 C) (Oral)   Resp 16   Ht 5' 10.87" (1.8 m)   Wt 214 lb (97.1 kg)   SpO2 98%   BMI 29.96 kg/m   BP Readings from Last 3 Encounters:  04/11/18 132/82  01/06/18 136/90  06/24/17 (!) 164/78    The 10-year ASCVD risk score Mikey Bussing DC Jr., et al., 2013) is: 23.6%   Values used to calculate the score:     Age: 60 years     Sex: Male     Is Non-Hispanic African American: Yes     Diabetic: Yes     Tobacco smoker: No     Systolic Blood Pressure: 741 mmHg     Is BP treated: Yes     HDL Cholesterol: 49 mg/dL     Total Cholesterol: 232 mg/dL   Physical Exam    Constitutional: He is oriented to person, place, and time. He appears well-developed and well-nourished.  Cardiovascular: Normal rate.  Pulmonary/Chest: Effort normal.  Neurological: He is alert and oriented to person, place, and time.   Assessment and Plan :   Essential hypertension  Type 2 diabetes mellitus without complication, without long-term current use of insulin ([place]) - Plan: metFORMIN (GLUCOPHAGE) 500 MG tablet  Mixed hyperlipidemia  Patient is doing very well.  I encouraged him to continue trying to make dietary modifications.  Refills provided for all his medications.  He is very stable.  He was agreeable to starting cholesterol medication, atorvastatin at 20 mg once daily. Counseled patient on potential for adverse effects with medications prescribed today, patient verbalized understanding.  Follow-up in 6 months to 1 year.  Jaynee Eagles, PA-C [facility] [phone] 04/11/2018  8:32 AM

## 2018-04-11 NOTE — Patient Instructions (Addendum)
Cholesterol Cholesterol is a white, waxy, fat-like substance that is needed by the human body in small amounts. The liver makes all the cholesterol we need. Cholesterol is carried from the liver by the blood through the blood vessels. Deposits of cholesterol (plaques) may build up on blood vessel (artery) walls. Plaques make the arteries narrower and stiffer. Cholesterol plaques increase the risk for heart attack and stroke. You cannot feel your cholesterol level even if it is very high. The only way to know that it is high is to have a blood test. Once you know your cholesterol levels, you should keep a record of the test results. Work with your health care provider to keep your levels in the desired range. What do the results mean?  Total cholesterol is a rough measure of all the cholesterol in your blood.  LDL (low-density lipoprotein) is the "bad" cholesterol. This is the type that causes plaque to build up on the artery walls. You want this level to be low.  HDL (high-density lipoprotein) is the "good" cholesterol because it cleans the arteries and carries the LDL away. You want this level to be high.  Triglycerides are fat that the body can either burn for energy or store. High levels are closely linked to heart disease. What are the desired levels of cholesterol?  Total cholesterol below 200.  LDL below 100 for people who are at risk, below 70 for people at very high risk.  HDL above 40 is good. A level of 60 or higher is considered to be protective against heart disease.  Triglycerides below 150. How can I lower my cholesterol? Diet Follow your diet program as told by your health care provider.  Choose fish or white meat chicken and [place], roasted or baked. Limit fatty cuts of red meat, fried foods, and processed meats, such as sausage and lunch meats.  Eat lots of fresh fruits and vegetables.  Choose whole grains, beans, pasta, potatoes, and cereals.  Choose olive oil, corn  oil, or canola oil, and use only small amounts.  Avoid butter, mayonnaise, shortening, or palm kernel oils.  Avoid foods with trans fats.  Drink skim or nonfat milk and eat low-fat or nonfat yogurt and cheeses. Avoid whole milk, cream, ice cream, egg yolks, and full-fat cheeses.  Healthier desserts include angel food cake, ginger snaps, animal crackers, hard candy, popsicles, and low-fat or nonfat frozen yogurt. Avoid pastries, cakes, pies, and cookies.  Exercise  Follow your exercise program as told by your health care provider. A regular program: ? Helps to decrease LDL and raise HDL. ? Helps with weight control.  Do things that increase your activity level, such as gardening, walking, and taking the stairs.  Ask your health care provider about ways that you can be more active in your daily life.  Medicine  Take over-the-counter and prescription medicines only as told by your health care provider. ? Medicine may be prescribed by your health care provider to help lower cholesterol and decrease the risk for heart disease. This is usually done if diet and exercise have failed to bring down cholesterol levels. ? If you have several risk factors, you may need medicine even if your levels are normal.  This information is not intended to replace advice given to you by your health care provider. Make sure you discuss any questions you have with your health care provider. Document Released: 04/05/2001 Document Revised: 02/06/2016 Document Reviewed: 01/09/2016 Elsevier Interactive Patient Education  [employer].  Diabetes Mellitus and Nutrition When you have diabetes (diabetes mellitus), it is very important to have healthy eating habits because your blood sugar (glucose) levels are greatly affected by what you eat and drink. Eating healthy foods in the appropriate amounts, at about the same times every day, can help you:  Control your blood glucose.  Lower your risk of heart  disease.  Improve your blood pressure.  Reach or maintain a healthy weight.  Every person with diabetes is different, and each person has different needs for a meal plan. Your health care provider may recommend that you work with a diet and nutrition specialist (dietitian) to make a meal plan that is best for you. Your meal plan may vary depending on factors such as:  The calories you need.  The medicines you take.  Your weight.  Your blood glucose, blood pressure, and cholesterol levels.  Your activity level.  Other health conditions you have, such as heart or kidney disease.  How do carbohydrates affect me? Carbohydrates affect your blood glucose level more than any other type of food. Eating carbohydrates naturally increases the amount of glucose in your blood. Carbohydrate counting is a method for keeping track of how many carbohydrates you eat. Counting carbohydrates is important to keep your blood glucose at a healthy level, especially if you use insulin or take certain oral diabetes medicines. It is important to know how many carbohydrates you can safely have in each meal. This is different for every person. Your dietitian can help you calculate how many carbohydrates you should have at each meal and for snack. Foods that contain carbohydrates include:  Bread, cereal, rice, pasta, and crackers.  Potatoes and corn.  Peas, beans, and lentils.  Milk and yogurt.  Fruit and juice.  Desserts, such as cakes, cookies, ice cream, and candy.  How does alcohol affect me? Alcohol can cause a sudden decrease in blood glucose (hypoglycemia), especially if you use insulin or take certain oral diabetes medicines. Hypoglycemia can be a life-threatening condition. Symptoms of hypoglycemia (sleepiness, dizziness, and confusion) are similar to symptoms of having too much alcohol. If your health care provider says that alcohol is safe for you, follow these guidelines:  Limit alcohol intake  to no more than 1 drink per day for nonpregnant women and 2 drinks per day for men. One drink equals 12 oz of beer, 5 oz of wine, or 1 oz of hard liquor.  Do not drink on an empty stomach.  Keep yourself hydrated with water, diet soda, or unsweetened iced tea.  Keep in mind that regular soda, juice, and other mixers may contain a lot of sugar and must be counted as carbohydrates.  What are tips for following this plan? Reading food labels  Start by checking the serving size on the label. The amount of calories, carbohydrates, fats, and other nutrients listed on the label are based on one serving of the food. Many foods contain more than one serving per package.  Check the total grams (g) of carbohydrates in one serving. You can calculate the number of servings of carbohydrates in one serving by dividing the total carbohydrates by 15. For example, if a food has 30 g of total carbohydrates, it would be equal to 2 servings of carbohydrates.  Check the number of grams (g) of saturated and trans fats in one serving. Choose foods that have low or no amount of these fats.  Check the number of milligrams (mg) of sodium in one serving. Most  people should limit total sodium intake to less than 2,300 mg per day.  Always check the nutrition information of foods labeled as "low-fat" or "nonfat". These foods may be higher in added sugar or refined carbohydrates and should be avoided.  Talk to your dietitian to identify your daily goals for nutrients listed on the label. Shopping  Avoid buying canned, premade, or processed foods. These foods tend to be high in fat, sodium, and added sugar.  Shop around the outside edge of the grocery store. This includes fresh fruits and vegetables, bulk grains, fresh meats, and fresh dairy. Cooking  Use low-heat cooking methods, such as baking, instead of high-heat cooking methods like deep frying.  Cook using healthy oils, such as olive, canola, or sunflower  oil.  Avoid cooking with butter, cream, or high-fat meats. Meal planning  Eat meals and snacks regularly, preferably at the same times every day. Avoid going long periods of time without eating.  Eat foods high in fiber, such as fresh fruits, vegetables, beans, and whole grains. Talk to your dietitian about how many servings of carbohydrates you can eat at each meal.  Eat 4-6 ounces of lean protein each day, such as lean meat, chicken, fish, eggs, or tofu. 1 ounce is equal to 1 ounce of meat, chicken, or fish, 1 egg, or 1/4 cup of tofu.  Eat some foods each day that contain healthy fats, such as avocado, nuts, seeds, and fish. Lifestyle   Check your blood glucose regularly.  Exercise at least 30 minutes 5 or more days each week, or as told by your health care provider.  Take medicines as told by your health care provider.  Do not use any products that contain nicotine or tobacco, such as cigarettes and e-cigarettes. If you need help quitting, ask your health care provider.  Work with a Social worker or diabetes educator to identify strategies to manage stress and any emotional and social challenges. What are some questions to ask my health care provider?  Do I need to meet with a diabetes educator?  Do I need to meet with a dietitian?  What number can I call if I have questions?  When are the best times to check my blood glucose? Where to find more information:  American Diabetes Association: diabetes.org/food-and-fitness/food  Academy of Nutrition and Dietetics: PokerClues.dk  [employer] of Diabetes and Digestive and Kidney Diseases (NIH): ContactWire.be Summary  A healthy meal plan will help you control your blood glucose and maintain a healthy lifestyle.  Working with a diet and nutrition specialist (dietitian) can help you make a meal plan that  is best for you.  Keep in mind that carbohydrates and alcohol have immediate effects on your blood glucose levels. It is important to count carbohydrates and to use alcohol carefully. This information is not intended to replace advice given to you by your health care provider. Make sure you discuss any questions you have with your health care provider. Document Released: 04/07/2005 Document Revised: 08/15/2016 Document Reviewed: 08/15/2016 Elsevier Interactive Patient Education  [employer].     If you have lab work done today you will be contacted with your lab results within the next 2 weeks.  If you have not heard from [place] then please contact us. The fastest way to get your results is to register for My Chart.   IF you received an x-ray today, you will receive an invoice from [facility]. Please contact [facility] at [phone] with questions or concerns regarding  your invoice.   IF you received labwork today, you will receive an invoice from [facility]. Please contact [facility] at [phone] with questions or concerns regarding your invoice.   Our billing staff will not be able to assist you with questions regarding bills from these companies.  You will be contacted with the lab results as soon as they are available. The fastest way to get your results is to activate your My Chart account. Instructions are located on the last page of this paperwork. If you have not heard from [place] regarding the results in 2 weeks, please contact this office.

## 2018-08-01 ENCOUNTER — Emergency Department (HOSPITAL_COMMUNITY)
Admission: EM | Admit: 2018-08-01 | Discharge: 2018-08-02 | Disposition: A | Discharge status: Home / Self Care | Payer: BC Managed Care – PPO | Attending: Emergency Medicine | Admitting: Emergency Medicine

## 2018-08-01 ENCOUNTER — Encounter (HOSPITAL_COMMUNITY): Payer: Self-pay | Admitting: Emergency Medicine

## 2018-08-01 DIAGNOSIS — Z87891 Personal history of nicotine dependence: Secondary | ICD-10-CM | POA: Diagnosis not present

## 2018-08-01 DIAGNOSIS — Z79899 Other long term (current) drug therapy: Secondary | ICD-10-CM | POA: Diagnosis not present

## 2018-08-01 DIAGNOSIS — I10 Essential (primary) hypertension: Secondary | ICD-10-CM | POA: Diagnosis not present

## 2018-08-01 DIAGNOSIS — E119 Type 2 diabetes mellitus without complications: Secondary | ICD-10-CM | POA: Insufficient documentation

## 2018-08-01 DIAGNOSIS — J111 Influenza due to unidentified influenza virus with other respiratory manifestations: Secondary | ICD-10-CM | POA: Diagnosis not present

## 2018-08-01 DIAGNOSIS — Z7984 Long term (current) use of oral hypoglycemic drugs: Secondary | ICD-10-CM | POA: Diagnosis not present

## 2018-08-01 DIAGNOSIS — R69 Illness, unspecified: Secondary | ICD-10-CM

## 2018-08-01 DIAGNOSIS — R05 Cough: Secondary | ICD-10-CM | POA: Diagnosis present

## 2018-08-01 MED ORDER — IBUPROFEN 400 MG PO TABS
600.0000 mg | ORAL_TABLET | Freq: Once | ORAL | Status: AC
  Administered 2018-08-01: 600 mg via ORAL
  Filled 2018-08-01: qty 1

## 2018-08-01 NOTE — ED Triage Notes (Signed)
Pt reports flu like symptoms including general weakness, non-productive cough, sob (at times), body aches, nausea and headache.  Pt works at a school.

## 2018-08-02 ENCOUNTER — Emergency Department (HOSPITAL_COMMUNITY): Payer: BC Managed Care – PPO

## 2018-08-02 LAB — URINALYSIS, ROUTINE W REFLEX MICROSCOPIC
BILIRUBIN URINE: NEGATIVE
Glucose, UA: NEGATIVE mg/dL
KETONES UR: NEGATIVE mg/dL
NITRITE: NEGATIVE
Protein, ur: NEGATIVE mg/dL
SPECIFIC GRAVITY, URINE: 1.003 — AB (ref 1.005–1.030)
pH: 6 (ref 5.0–8.0)

## 2018-08-02 NOTE — ED Provider Notes (Signed)
[facility] EMERGENCY DEPARTMENT Provider Note   CSN: [id number] Arrival date & time: 08/01/18  2254     History   Chief Complaint Chief Complaint  Patient presents with  . flu like symptoms    HPI Alejandro [name] is a 58 y.o. male.  With a past medical history of diabetes, hypertension who presents the emergency department with chief complaint of flulike symptoms.  Patient has had 4 days of productive cough, weakness, headache, congestion, body aches and fatigue.  Patient is an Glass blower/designer at [employer].  He denies abdominal pain nausea or vomiting. Patient states that he has noticed some difficulty starting his urinary stream and told his wife "I am not old enough for that yet."  He denies any other urinary symptoms HPI  Past Medical History:  Diagnosis Date  . Diabetes mellitus without complication ([place])   . Fatty liver   . Hepatitis C   . Hypertension   . Tubular adenoma of colon 12/2015    Patient Active Problem List   Diagnosis Date Noted  . Essential hypertension 03/11/2017  . Diabetes ([place]) 09/14/2014  . Gallstones 09/14/2014  . Chronic hepatitis C virus infection ([place]) 12/01/2008  . ANEMIA, IRON DEFICIENCY 12/01/2008  . BLOOD IN STOOL 12/01/2008    Past Surgical History:  Procedure Laterality Date  . COLONOSCOPY          Home Medications    Prior to Admission medications   Medication Sig Start Date End Date Taking? Authorizing Provider  amLODipine (NORVASC) 5 MG tablet Take 1 tablet (5 mg total) by mouth daily. 04/11/18   Jaynee Eagles, PA-C  atorvastatin (LIPITOR) 20 MG tablet Take 1 tablet (20 mg total) by mouth daily. 04/11/18   Jaynee Eagles, PA-C  Blood Glucose Monitoring Suppl (ONE TOUCH ULTRA 2) w/Device KIT USE AS DIRECTED TO TEST BLOOD SUGAR IN THE MORNING 08/07/16   Ivar Drape D, PA  Blood Glucose Monitoring Suppl KIT 1 application by Does not apply route every morning. 04/01/16   Ivar Drape D, PA  Blood  Pressure Monitoring (BLOOD PRESSURE CUFF) MISC Use as directed 07/07/17   Shawnee Knapp, MD  fluticasone [facility]) 50 MCG/ACT nasal spray Place 1-2 sprays into both nostrils daily. 04/11/18   Jaynee Eagles, PA-C  hydrochlorothiazide (HYDRODIURIL) 25 MG tablet Take 1 tablet (25 mg total) by mouth daily. 04/11/18   Jaynee Eagles, PA-C  metFORMIN (GLUCOPHAGE) 500 MG tablet Take 1 tablet (500 mg total) by mouth 2 (two) times daily with a meal. 04/11/18   Jaynee Eagles, PA-C  ONETOUCH VERIO test strip USE ONE STRIP TO CHECK GLUCOSE TWICE DAILY 06/26/17   Joretta Bachelor, PA    Family History Family History  Problem Relation Age of Onset  . Heart disease Mother 109       CAD/AMI  . Diabetes Mother   . Hypertension Mother   . Diabetes Maternal Grandfather   . Diabetes Paternal Grandfather   . Diabetes Father   . Diabetes Sister   . Hypertension Sister   . Hypertension Brother   . Diabetes Brother   . Colon cancer Neg Hx   . Esophageal cancer Neg Hx   . Stomach cancer Neg Hx   . Rectal cancer Neg Hx     Social History Social History   Tobacco Use  . Smoking status: Former Research scientist (life sciences)  . Smokeless tobacco: Never Used  Substance Use Topics  . Alcohol use: Yes    Comment: occasional wine  .  Drug use: No     Allergies   Tylenol [acetaminophen]   Review of Systems Review of Systems  Ten systems reviewed and are negative for acute change, except as noted in the HPI.   Physical Exam Updated Vital Signs BP 130/75 (BP Location: Right Arm)   Pulse 82   Temp 98.1 F (36.7 C) (Oral)   Resp 18   SpO2 100%   Physical Exam  Physical Exam  Nursing note and vitals reviewed. Constitutional: He appears well-developed and well-nourished.  Patient appears ill but nontoxic.  HENT:  Head: Normocephalic and atraumatic.  Eyes: Conjunctivae normal are normal. No scleral icterus.  Neck: Normal range of motion. Neck supple.  Cardiovascular: Normal rate, regular rhythm and normal heart sounds.     Pulmonary/Chest: Effort normal and breath sounds normal. No respiratory distress.  Abdominal: Soft. There is no tenderness.  Musculoskeletal: He exhibits no edema.  Neurological: He is alert.  Skin: Skin is warm and dry. He is not diaphoretic.  Psychiatric: His behavior is normal.    ED Treatments / Results  Labs (all labs ordered are listed, but only abnormal results are displayed) Labs Reviewed  URINALYSIS, ROUTINE W REFLEX MICROSCOPIC - Abnormal; Notable for the following components:      Result Value   Color, Urine STRAW (*)    Specific Gravity, Urine 1.003 (*)    Hgb urine dipstick SMALL (*)    Leukocytes, UA SMALL (*)    Bacteria, UA RARE (*)    All other components within normal limits  INFLUENZA PANEL BY PCR (TYPE A & B)    EKG None  Radiology Dg Chest 2 View  Result Date: 08/02/2018 CLINICAL DATA:  Productive cough with fever for 4 days EXAM: CHEST - 2 VIEW COMPARISON:  06/24/2017 FINDINGS: Normal heart size and mediastinal contours. Stable calcified left upper lobe nodule. No acute infiltrate or edema. No effusion or pneumothorax. No acute osseous findings. Cholelithiasis seen since at least 2013 chest CT. IMPRESSION: 1. No evidence of active disease. 2. Cholelithiasis. Electronically Signed   By: Monte Fantasia M.D.   On: 08/02/2018 04:45    Procedures Procedures (including critical care time)  Medications Ordered in ED Medications  ibuprofen (ADVIL,MOTRIN) tablet 600 mg (600 mg Oral Given 08/01/18 2339)     Initial Impression / Assessment and Plan / ED Course  I have reviewed the triage vital signs and the nursing notes.  Pertinent labs & imaging results that were available during my care of the patient were reviewed by me and considered in my medical decision making (see chart for details).     Patient with flulike symptoms.  Febrile upon arrival but patient received Motrin with improvement.  Patient is out of the window for treatment with anti-flu  medication.  Will send for epidemiologic tracking.  Patient appears otherwise safe for discharge at this time.  His chest x-ray shows no infiltrates or consolidation concerning for pneumonia, his urine is without signs of infection.  Discussed return precautions and home care.   Final Clinical Impressions(s) / ED Diagnoses   Final diagnoses:  Influenza-like illness    ED Discharge Orders    None       Margarita Mail, PA-C 38/88/75 [id number]    Delora Fuel, MD 82/06/01 [phone]

## 2018-08-02 NOTE — Discharge Instructions (Signed)
Take Corcidin and tylenol for relief of your symptoms. Follow these instructions at home: Take over-the-counter and prescription medicines only as told by your health care provider. Use a cool mist humidifier to add humidity to the air in your home. This can make breathing easier. Rest as needed. Drink enough fluid to keep your urine clear or pale yellow. Cover your mouth and nose when you cough or sneeze. Wash your hands with soap and water often, especially after you cough or sneeze. If soap and water are not available, use hand sanitizer. Stay home from work or school as told by your health care provider. Unless you are visiting your health care provider, try to avoid leaving home until your fever has been gone for 24 hours without the use of medicine. Keep all follow-up visits as told by your health care provider. This is important. Contact a health care provider if: You develop new symptoms. You have: Chest pain. Diarrhea. A fever. Your cough gets worse. You produce more mucus. You feel nauseous or you vomit. Get help right away if: You develop shortness of breath or difficulty breathing. Your skin or nails turn a bluish color. You have severe pain or stiffness in your neck. You develop a sudden headache or sudden pain in your face or ear. You cannot stop vomiting.

## 2018-09-03 ENCOUNTER — Ambulatory Visit (HOSPITAL_COMMUNITY)
Admission: EM | Admit: 2018-09-03 | Discharge: 2018-09-03 | Disposition: A | Discharge status: Home / Self Care | Payer: BC Managed Care – PPO | Attending: Family Medicine | Admitting: Family Medicine

## 2018-09-03 ENCOUNTER — Encounter (HOSPITAL_COMMUNITY): Payer: Self-pay

## 2018-09-03 ENCOUNTER — Other Ambulatory Visit: Payer: Self-pay

## 2018-09-03 DIAGNOSIS — I10 Essential (primary) hypertension: Secondary | ICD-10-CM | POA: Diagnosis not present

## 2018-09-03 DIAGNOSIS — L509 Urticaria, unspecified: Secondary | ICD-10-CM | POA: Diagnosis not present

## 2018-09-03 MED ORDER — TRIAMCINOLONE ACETONIDE 0.1 % EX CREA: 1.0000 "application " | TOPICAL_CREAM | Freq: Two times a day (BID) | CUTANEOUS | 0 refills | Status: DC

## 2018-09-03 NOTE — Discharge Instructions (Signed)
Keep diary of when this occurs and if there is a relation to eating of red meat

## 2018-09-03 NOTE — ED Triage Notes (Signed)
Pt cc had a rash on his upper left arm . Pt  Rash was red swollen and itching. This rash came up this morning.

## 2018-09-03 NOTE — ED Provider Notes (Signed)
[facility]    CSN: [id number] Arrival date & time: 09/03/18  4627     History   Chief Complaint Chief Complaint  Patient presents with  . Rash    HPI Alejandro [name] is a 58 y.o. male.   This is a recurring problem over a period of years.  He thinks he may have been bitten by tick in this area.  There is localized swelling and pruritus but none elsewhere  HPI  Past Medical History:  Diagnosis Date  . Diabetes mellitus without complication ([place])   . Fatty liver   . Hepatitis C   . Hypertension   . Tubular adenoma of colon 12/2015    Patient Active Problem List   Diagnosis Date Noted  . Essential hypertension 03/11/2017  . Diabetes ([place]) 09/14/2014  . Gallstones 09/14/2014  . Chronic hepatitis C virus infection ([place]) 12/01/2008  . ANEMIA, IRON DEFICIENCY 12/01/2008  . BLOOD IN STOOL 12/01/2008    Past Surgical History:  Procedure Laterality Date  . COLONOSCOPY         Home Medications    Prior to Admission medications   Medication Sig Start Date End Date Taking? Authorizing Provider  amLODipine (NORVASC) 5 MG tablet Take 1 tablet (5 mg total) by mouth daily. 04/11/18   Jaynee Eagles, PA-C  atorvastatin (LIPITOR) 20 MG tablet Take 1 tablet (20 mg total) by mouth daily. 04/11/18   Jaynee Eagles, PA-C  Blood Glucose Monitoring Suppl (ONE TOUCH ULTRA 2) w/Device KIT USE AS DIRECTED TO TEST BLOOD SUGAR IN THE MORNING 08/07/16   Ivar Drape D, PA  Blood Glucose Monitoring Suppl KIT 1 application by Does not apply route every morning. 04/01/16   Ivar Drape D, PA  Blood Pressure Monitoring (BLOOD PRESSURE CUFF) MISC Use as directed 07/07/17   Shawnee Knapp, MD  fluticasone [facility]) 50 MCG/ACT nasal spray Place 1-2 sprays into both nostrils daily. 04/11/18   Jaynee Eagles, PA-C  hydrochlorothiazide (HYDRODIURIL) 25 MG tablet Take 1 tablet (25 mg total) by mouth daily. 04/11/18   Jaynee Eagles, PA-C  metFORMIN (GLUCOPHAGE) 500 MG tablet Take 1 tablet (500  mg total) by mouth 2 (two) times daily with a meal. 04/11/18   Jaynee Eagles, PA-C  ONETOUCH VERIO test strip USE ONE STRIP TO CHECK GLUCOSE TWICE DAILY 06/26/17   Joretta Bachelor, PA    Family History Family History  Problem Relation Age of Onset  . Heart disease Mother 55       CAD/AMI  . Diabetes Mother   . Hypertension Mother   . Diabetes Maternal Grandfather   . Diabetes Paternal Grandfather   . Diabetes Father   . Diabetes Sister   . Hypertension Sister   . Hypertension Brother   . Diabetes Brother   . Colon cancer Neg Hx   . Esophageal cancer Neg Hx   . Stomach cancer Neg Hx   . Rectal cancer Neg Hx     Social History Social History   Tobacco Use  . Smoking status: Former Research scientist (life sciences)  . Smokeless tobacco: Never Used  Substance Use Topics  . Alcohol use: Yes    Comment: occasional wine  . Drug use: No     Allergies   Tylenol [acetaminophen]   Review of Systems Review of Systems  Skin: Positive for rash.  All other systems reviewed and are negative.    Physical Exam Triage Vital Signs ED Triage Vitals  Enc Vitals Group     BP 09/03/18  0962 (!) 162/99     Pulse Rate 09/03/18 0931 74     Resp 09/03/18 0931 18     Temp 09/03/18 0931 98.3 F (36.8 C)     Temp Source 09/03/18 0931 Oral     SpO2 09/03/18 0931 100 %     Weight 09/03/18 0936 214 lb (97.1 kg)     Height --      Head Circumference --      Peak Flow --      Pain Score 09/03/18 0935 0     Pain Loc --      Pain Edu? --      Excl. in [place]? --    No data found.  Updated Vital Signs BP (!) 162/99 (BP Location: Left Arm)   Pulse 74   Temp 98.3 F (36.8 C) (Oral)   Resp 18   Wt 97.1 kg   SpO2 100%   BMI 29.96 kg/m   Visual Acuity Right Eye Distance:   Left Eye Distance:   Bilateral Distance:    Right Eye Near:   Left Eye Near:    Bilateral Near:     Physical Exam Constitutional:      Appearance: Normal appearance. He is normal weight.  Skin:    Comments: There is 1 we will  anterior to the left axilla. Skin elsewhere is normal.  Neurological:     Mental Status: He is alert.      UC Treatments / Results  Labs (all labs ordered are listed, but only abnormal results are displayed) Labs Reviewed - No data to display  EKG None  Radiology No results found.  Procedures Procedures (including critical care time)  Medications Ordered in UC Medications - No data to display  Initial Impression / Assessment and Plan / UC Course  I have reviewed the triage vital signs and the nursing notes.  Pertinent labs & imaging results that were available during my care of the patient were reviewed by me and considered in my medical decision making (see chart for details).     Allergic reaction probably to insect bite.  We discussed this.  If this was related to tick bite consider some variation of alpha gal syndrome.  He thinks there may be some connection to eating steak and outbreak. Final Clinical Impressions(s) / UC Diagnoses   Final diagnoses:  None   Discharge Instructions   None    ED Prescriptions    None     Controlled Substance Prescriptions [place] Controlled Substance Registry consulted? No   Wardell Honour, MD 09/03/18 1007

## 2018-09-04 ENCOUNTER — Telehealth: Payer: Self-pay | Admitting: Family Medicine

## 2018-09-04 NOTE — Telephone Encounter (Signed)
Copied from [place] [id number]. Topic: General - Other >> Sep 04, 2018 12:51 PM Judyann [place] wrote: Reason for CRM: Patient is calling to state his Blood pressure medication-hydrochlorothiazide (HYDRODIURIL) 25 MG tablet he is taking this now and stated it is working a little but he would like a higher dose. The best contact number is [phone]. Please advise   [facility] ([address] [place] [facility] [place] [phone] (Phone) [phone] (Fax)

## 2018-09-06 NOTE — Telephone Encounter (Signed)
Spoke with pt this morning about medication concerns and informed him that he would need to discuss these changes with Dr. Brigitte Pulse at his next [place].

## 2018-09-24 ENCOUNTER — Telehealth: Payer: Self-pay | Admitting: Family Medicine

## 2018-09-24 NOTE — Telephone Encounter (Signed)
Tried to call patient to let them know Dr. Shaw is no longer at [facility] and that their appointment with her is going to be cancelled. ° °LVM for patient, if patient calls back, please try to get them rescheduled with a different provider or let them know that Dr Shaw is going to be working at [facility] the number there is [phone]. °

## 2018-10-08 ENCOUNTER — Other Ambulatory Visit: Payer: Self-pay

## 2018-10-08 ENCOUNTER — Ambulatory Visit: Payer: BC Managed Care – PPO | Admitting: Family Medicine

## 2018-10-08 ENCOUNTER — Encounter: Payer: Self-pay | Admitting: Family Medicine

## 2018-10-08 VITALS — BP 132/82 | HR 71 | Temp 98.5°F | Resp 18 | Ht 69.69 in | Wt 222.0 lb

## 2018-10-08 DIAGNOSIS — I10 Essential (primary) hypertension: Secondary | ICD-10-CM

## 2018-10-08 DIAGNOSIS — E782 Mixed hyperlipidemia: Secondary | ICD-10-CM

## 2018-10-08 DIAGNOSIS — Z862 Personal history of diseases of the blood and blood-forming organs and certain disorders involving the immune mechanism: Secondary | ICD-10-CM | POA: Diagnosis not present

## 2018-10-08 DIAGNOSIS — E119 Type 2 diabetes mellitus without complications: Secondary | ICD-10-CM | POA: Diagnosis not present

## 2018-10-08 DIAGNOSIS — E6609 Other obesity due to excess calories: Secondary | ICD-10-CM

## 2018-10-08 DIAGNOSIS — Z6832 Body mass index (BMI) 32.0-32.9, adult: Secondary | ICD-10-CM

## 2018-10-08 MED ORDER — METFORMIN HCL ER 750 MG PO TB24: 750.0000 mg | ORAL_TABLET | Freq: Every day | ORAL | 1 refills | Status: DC

## 2018-10-08 NOTE — Patient Instructions (Signed)
° ° ° °  If you have lab work done today you will be contacted with your lab results within the next 2 weeks.  If you have not heard from us then please contact us. The fastest way to get your results is to register for My Chart. ° ° °IF you received an x-ray today, you will receive an invoice from [facility]. Please contact  Radiology at [phone] with questions or concerns regarding your invoice.  ° °IF you received labwork today, you will receive an invoice from [facility]. Please contact [facility] at [phone] with questions or concerns regarding your invoice.  ° °Our billing staff will not be able to assist you with questions regarding bills from these companies. ° °You will be contacted with the lab results as soon as they are available. The fastest way to get your results is to activate your My Chart account. Instructions are located on the last page of this paperwork. If you have not heard from us regarding the results in 2 weeks, please contact this office. °  ° ° ° °

## 2018-10-08 NOTE — Progress Notes (Signed)
3/16/20209:04 AM  Alejandro [name] 05/29/1961, 58 y.o. male [id number]  Chief Complaint  Patient presents with  . Hypertension    3 month follow-up   . Diabetes    HPI:   Patient is a 58 y.o. male with past medical history significant for DM2, HTN, HLP, Hepatis C s/p treatment who presents today to re-establish care  Last OV Sept 2019 with Alejandro [name], [place] Patient states that he used to be on amlodipine 77m instead of 533m would like to get back in 1045me otherwise has been feeling better and eating more,  New job eating more fast food and drinking soda Checking cbgs occasionally, sometimes fasting, sometimes afternoon Tends to forget dinner metformin Has his meter with him: range 91-185, mostly < 150 Needs to start exercising again Due for this eye exam  Lab Results  Component Value Date   HGBA1C 6.3 (H) 01/06/2018   HGBA1C 6.1 03/11/2017   HGBA1C 4.9 08/13/2016   Lab Results  Component Value Date   MICROALBUR <0.2 08/26/2015   LDLCALC 145 (H) 01/06/2018   CREATININE 1.07 01/06/2018    Fall Risk  10/08/2018 04/11/2018 01/06/2018 06/24/2017 03/11/2017  Falls in the past year? 0 No No No No  Injury with Fall? 0 - - - -     [facility] 10/08/2018 04/11/2018 01/06/2018  Decreased Interest 0 0 0  Down, Depressed, Hopeless 0 0 0  PHQ - 2 Score 0 0 0    Allergies  Allergen Reactions  . Tylenol [Acetaminophen] Hypertension    Prior to Admission medications   Medication Sig Start Date End Date Taking? Authorizing Provider  amLODipine (NORVASC) 5 MG tablet Take 1 tablet (5 mg total) by mouth daily. 04/11/18  Yes ManJaynee EaglesA-C  atorvastatin (LIPITOR) 20 MG tablet Take 1 tablet (20 mg total) by mouth daily. 04/11/18  Yes ManJaynee EaglesA-C  Blood Glucose Monitoring Suppl (ONE TOUCH ULTRA 2) w/Device KIT USE AS DIRECTED TO TEST BLOOD SUGAR IN THE MORNING 08/07/16  Yes English, Stephanie D, PA  Blood Glucose Monitoring Suppl KIT 1 application by Does not apply  route every morning. 04/01/16  Yes EngIvar Drape PA  Blood Pressure Monitoring (BLOOD PRESSURE CUFF) MISC Use as directed 07/07/17  Yes ShaShawnee KnappD  fluticasone ([facility] MCG/ACT nasal spray Place 1-2 sprays into both nostrils daily. 04/11/18  Yes ManJaynee EaglesA-C  hydrochlorothiazide (HYDRODIURIL) 25 MG tablet Take 1 tablet (25 mg total) by mouth daily. 04/11/18  Yes ManJaynee EaglesA-C  metFORMIN (GLUCOPHAGE) 500 MG tablet Take 1 tablet (500 mg total) by mouth 2 (two) times daily with a meal. 04/11/18  Yes ManJaynee EaglesA-C  ONETOUCH VERIO test strip USE ONE STRIP TO CHECK GLUCOSE TWICE DAILY 06/26/17  Yes EngIvar Drape PA  triamcinolone cream (KENALOG) 0.1 % Apply 1 application topically 2 (two) times daily. 09/03/18  Yes MilWardell HonourD    Past Medical History:  Diagnosis Date  . [place] ([place] . Fatty liver   . Hepatitis C   . Hypertension   . Tubular adenoma of colon 12/2015    Past Surgical History:  Procedure Laterality Date  . COLONOSCOPY      Social History   Tobacco Use  . 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 Alcohol use: Yes    Comment: occasional wine    Family History  Problem Relation Age  of Onset  . Heart disease Mother 59       CAD/AMI  . Diabetes Mother   . Hypertension Mother   . Diabetes Maternal Grandfather   . Diabetes Paternal Grandfather   . Diabetes Father   . Diabetes Sister   . Hypertension Sister   . Hypertension Brother   . Diabetes Brother   . Colon cancer Neg Hx   . Esophageal cancer Neg Hx   . Stomach cancer Neg Hx   . Rectal cancer Neg Hx     Review of Systems  Constitutional: Negative for chills and fever.  Respiratory: Negative for cough and shortness of breath.   Cardiovascular: Negative for chest pain, palpitations and leg swelling.  Gastrointestinal: Negative for abdominal pain, nausea and vomiting.     OBJECTIVE:   Blood pressure 132/82, [name] 71, temperature 98.5 F (36.9 C), temperature source Oral, resp. rate 18, height 5' 9.69" (1.77 m), weight 222 lb (100.7 kg), SpO2 99 %. Body mass index is 32.14 kg/m.   Wt Readings from Last 3 Encounters:  10/08/18 222 lb (100.7 kg)  09/03/18 214 lb (97.1 kg)  04/11/18 214 lb (97.1 kg)    Physical Exam Vitals signs and nursing note reviewed.  Constitutional:      Appearance: He is well-developed.  HENT:     Head: Normocephalic and atraumatic.  Eyes:     Conjunctiva/sclera: Conjunctivae normal.     Pupils: Pupils are equal, round, and reactive to light.  Neck:     Musculoskeletal: Neck supple.  Cardiovascular:     Rate and Rhythm: Normal rate and regular rhythm.     Heart sounds: No murmur. No friction rub. No gallop.   Pulmonary:     Effort: Pulmonary effort is normal.     Breath sounds: Normal breath sounds. No wheezing or rales.  Skin:    General: Skin is warm and dry.  Neurological:     Mental Status: He is alert and oriented to person, place, and time.     ASSESSMENT and PLAN  1. Type 2 diabetes mellitus without complication, without long-term current use of insulin (HCC) Last a1c at goal. Changing to XR metformin to improve compliance. Discussed LFM - Hemoglobin A1c - Ambulatory referral to Ophthalmology  2. Essential hypertension Controlled. Continue current regime.  - Comprehensive metabolic panel  3. Mixed hyperlipidemia Checking labs today, medications will be adjusted as needed.  - Lipid panel  4. H/O iron deficiency anemia - CBC  5. Class 1 obesity due to excess calories without serious comorbidity with body mass index (BMI) of 32.0 to 32.9 in adult Discussed importance of healthy low fat, low salt, low carb diet, regular exercise and healthy weight.   Other orders - metFORMIN (GLUCOPHAGE-XR) 750 MG 24 hr tablet; Take 1 tablet (750 mg total) by mouth daily with breakfast.  Return in about 3 months (around 01/08/2019)  for HTN and weight.    Rutherford Guys, MD [facility] [place], [place] [address] Ph.  [phone] Fax [phone]

## 2018-10-09 ENCOUNTER — Telehealth: Payer: Self-pay | Admitting: Family Medicine

## 2018-10-09 LAB — COMPREHENSIVE METABOLIC PANEL
ALT: 16 IU/L (ref 0–44)
AST: 26 IU/L (ref 0–40)
Albumin/Globulin Ratio: 1.8 (ref 1.2–2.2)
Albumin: 4.7 g/dL (ref 3.8–4.9)
Alkaline Phosphatase: 71 IU/L (ref 39–117)
BUN/Creatinine Ratio: 10 (ref 9–20)
BUN: 12 mg/dL (ref 6–24)
Bilirubin Total: <0.2 mg/dL (ref 0.0–1.2)
CO2: 21 mmol/L (ref 20–29)
Calcium: 9.9 mg/dL (ref 8.7–10.2)
Chloride: 103 mmol/L (ref 96–106)
Creatinine, Ser: 1.16 mg/dL (ref 0.76–1.27)
GFR calc Af Amer: 80 mL/min/{1.73_m2} (ref >59)
GFR calc non Af Amer: 69 mL/min/{1.73_m2} (ref >59)
Globulin, Total: 2.6 g/dL (ref 1.5–4.5)
Glucose: 135 mg/dL — ABNORMAL HIGH (ref 65–99)
Potassium: 5 mmol/L (ref 3.5–5.2)
Sodium: 143 mmol/L (ref 134–144)
Total Protein: 7.3 g/dL (ref 6.0–8.5)

## 2018-10-09 LAB — LIPID PANEL
Chol/HDL Ratio: 2.9 ratio (ref 0.0–5.0)
Cholesterol, Total: 140 mg/dL (ref 100–199)
HDL: 49 mg/dL (ref >39)
LDL Calculated: 76 mg/dL (ref 0–99)
Triglycerides: 76 mg/dL (ref 0–149)
VLDL Cholesterol Cal: 15 mg/dL (ref 5–40)

## 2018-10-09 LAB — CBC
Hematocrit: 42.3 % (ref 37.5–51.0)
Hemoglobin: 14.2 g/dL (ref 13.0–17.7)
MCH: 28.5 pg (ref 26.6–33.0)
MCHC: 33.6 g/dL (ref 31.5–35.7)
MCV: 85 fL (ref 79–97)
Platelets: 228 10*3/uL (ref 150–450)
RBC: 4.99 x10E6/uL (ref 4.14–5.80)
RDW: 12.8 % (ref 11.6–15.4)
WBC: 6.6 10*3/uL (ref 3.4–10.8)

## 2018-10-09 LAB — HEMOGLOBIN A1C

## 2018-10-09 NOTE — Telephone Encounter (Signed)
Called to cancel Appt for 06.24.2020 to let the m know Provider would not be in office. talke with pt wife FR

## 2018-11-01 ENCOUNTER — Telehealth: Payer: Self-pay | Admitting: Family Medicine

## 2018-11-01 NOTE — Telephone Encounter (Signed)
Copied from [place] [id number]. Topic: General - Other >> Nov 01, 2018 11:53 AM Carolyn Stare wrote:  Pt call and ask that a copy of his labs including his A1C results be mailed to his address on file

## 2018-11-02 ENCOUNTER — Telehealth: Payer: Self-pay | Admitting: Family Medicine

## 2018-11-02 NOTE — Telephone Encounter (Signed)
Called patient to inform them of  lab results. When patient returns call, triage nurse may disclose results.      Please let patient know that his cholesterol is at goal.   His electrolytes, kidney and liver functions are normal   He does not have anemia.   However for unknown reason his hemoglobin A1c was cancelled.   I have entered another order so that he can come by and have that done   Sorry for the inconvenience   Thanks   Pt given lab results per notes of Dr Pamella Pert on 11/02/2018. Pt verbalized understanding.

## 2018-11-02 NOTE — Telephone Encounter (Signed)
Mailed to pt

## 2018-11-02 NOTE — Addendum Note (Signed)
Addended by: Rutherford Guys on: 11/02/2018 01:15 PM   Modules accepted: Orders

## 2018-11-09 ENCOUNTER — Other Ambulatory Visit: Payer: Self-pay

## 2018-11-09 ENCOUNTER — Ambulatory Visit (INDEPENDENT_AMBULATORY_CARE_PROVIDER_SITE_OTHER): Payer: BC Managed Care – PPO | Admitting: Family Medicine

## 2018-11-09 DIAGNOSIS — E119 Type 2 diabetes mellitus without complications: Secondary | ICD-10-CM | POA: Diagnosis not present

## 2018-11-09 LAB — HEMOGLOBIN A1C
Est. average glucose Bld gHb Est-mCnc: 140 mg/dL
Hgb A1c MFr Bld: 6.5 % — ABNORMAL HIGH (ref 4.8–5.6)

## 2018-11-09 MED ORDER — FLUTICASONE PROPIONATE 50 MCG/ACT NA SUSP: 1.0000 | Freq: Every day | NASAL | 2 refills | Status: DC

## 2018-11-09 MED ORDER — GLUCOSE BLOOD VI STRP: ORAL_STRIP | 6 refills | Status: DC

## 2018-11-09 NOTE — Telephone Encounter (Signed)
Medication has been sent to pharmacy. Pt came into the office and got labs and requested these medication. They have been sent to the pharmacy.  Will need f/u visit for additional refills.

## 2018-11-26 ENCOUNTER — Other Ambulatory Visit: Payer: Self-pay

## 2018-11-26 DIAGNOSIS — I10 Essential (primary) hypertension: Secondary | ICD-10-CM

## 2018-11-26 DIAGNOSIS — E119 Type 2 diabetes mellitus without complications: Secondary | ICD-10-CM

## 2018-11-26 MED ORDER — METFORMIN HCL ER 750 MG PO TB24: 750.0000 mg | ORAL_TABLET | Freq: Every day | ORAL | 1 refills | Status: DC

## 2018-11-26 MED ORDER — HYDROCHLOROTHIAZIDE 25 MG PO TABS: 25.0000 mg | ORAL_TABLET | Freq: Every day | ORAL | 3 refills | Status: DC

## 2018-11-26 NOTE — Progress Notes (Signed)
Spoke to pt and refilled medication for bp and dm

## 2019-01-08 IMAGING — US US ABDOMEN LIMITED
1 series · 14 of 25 positions shown · non-contrast
Comparison: CT 08/30/2011.

CLINICAL DATA: Chronic hepatitis.

EXAM:
US ABDOMEN LIMITED - RIGHT UPPER QUADRANT

[Series 1: us abdomen limited · 0.23mm/px · 14 of 38 slices shown]
[im 1/38]
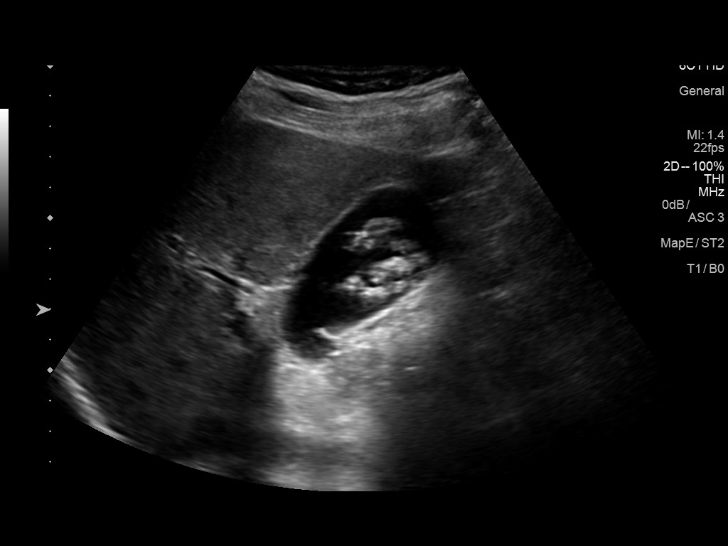
[im 4/38]
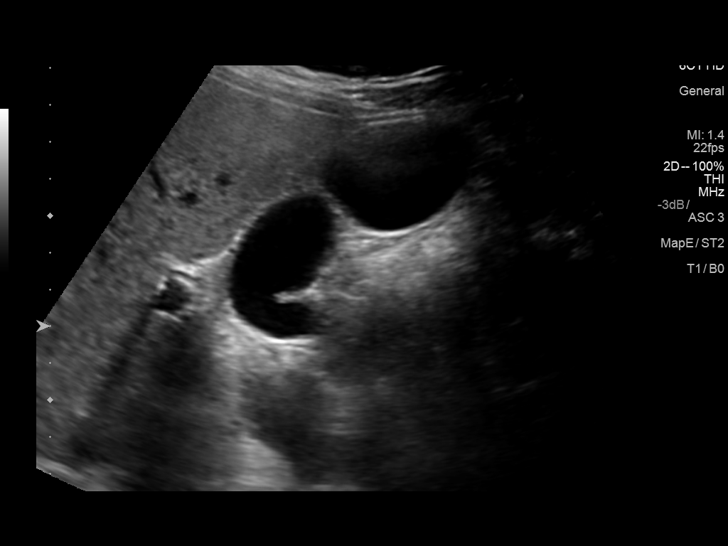
[im 7/38]
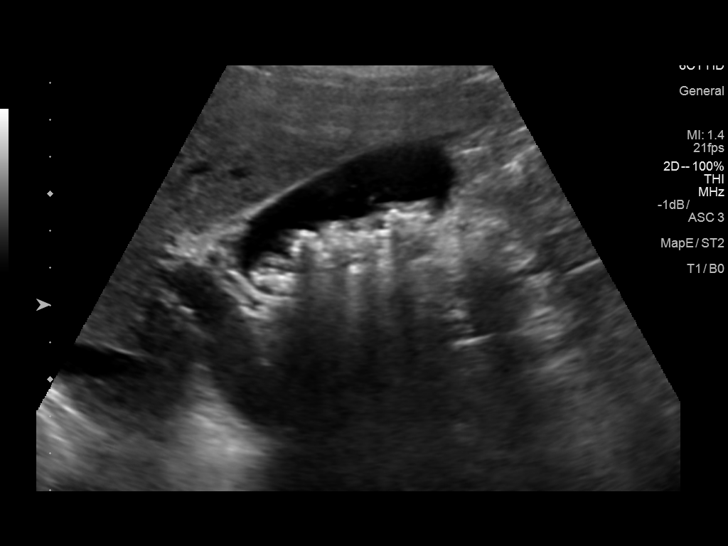
[im 10/38]
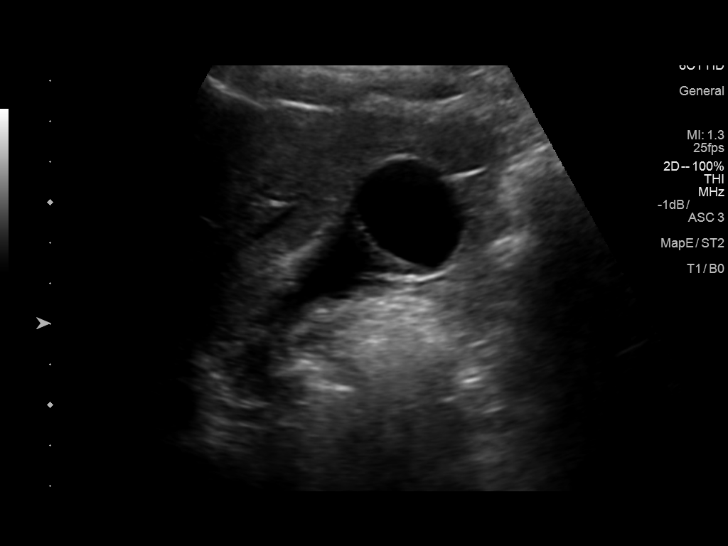
[im 13/38]
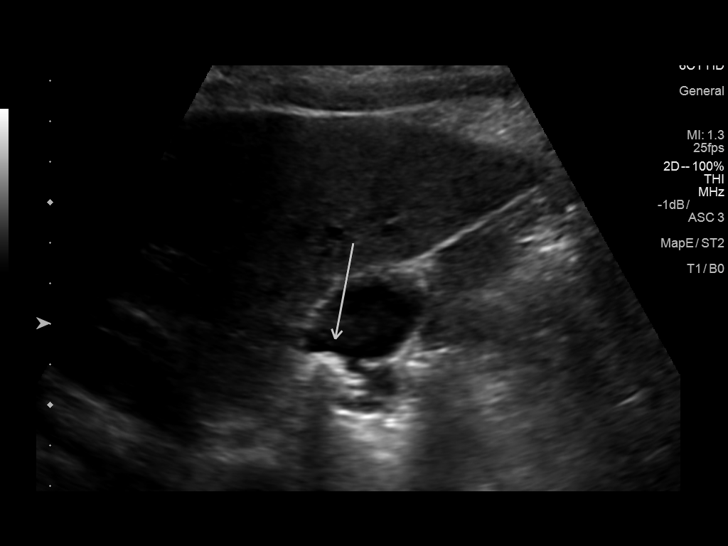
[im 14/38]
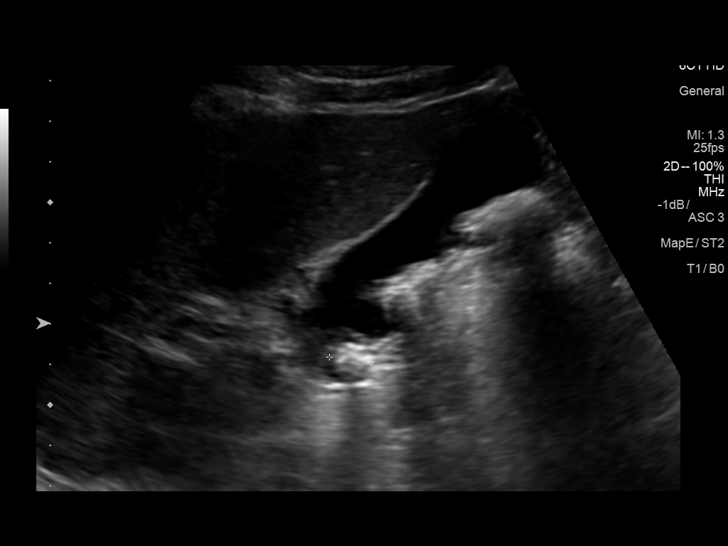
[im 17/38]
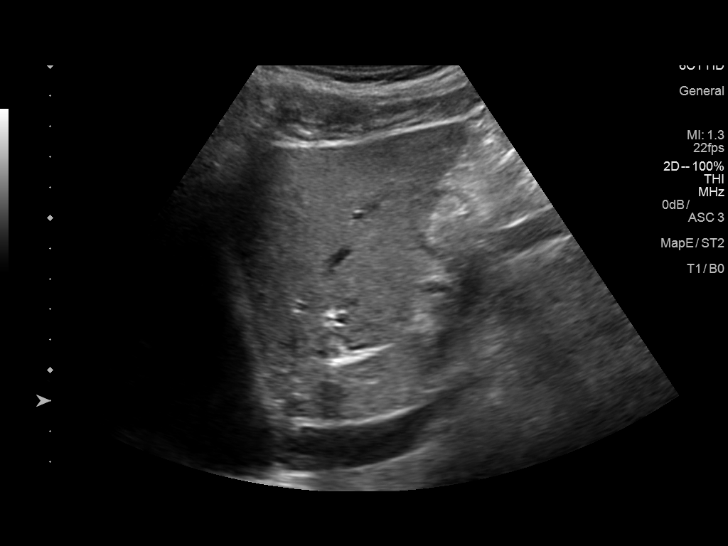
[im 21/38]
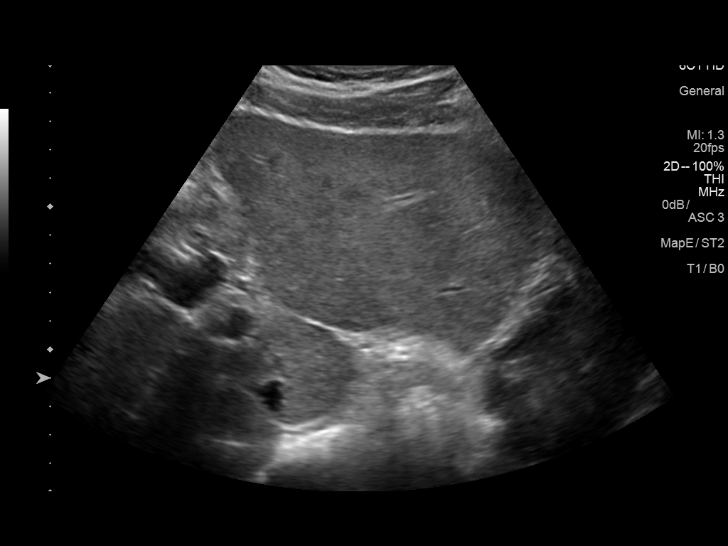
[im 24/38]
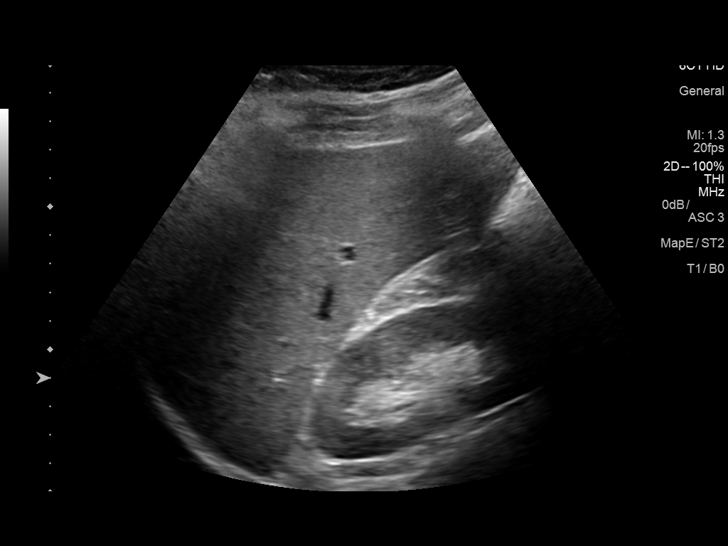
[im 25/38]
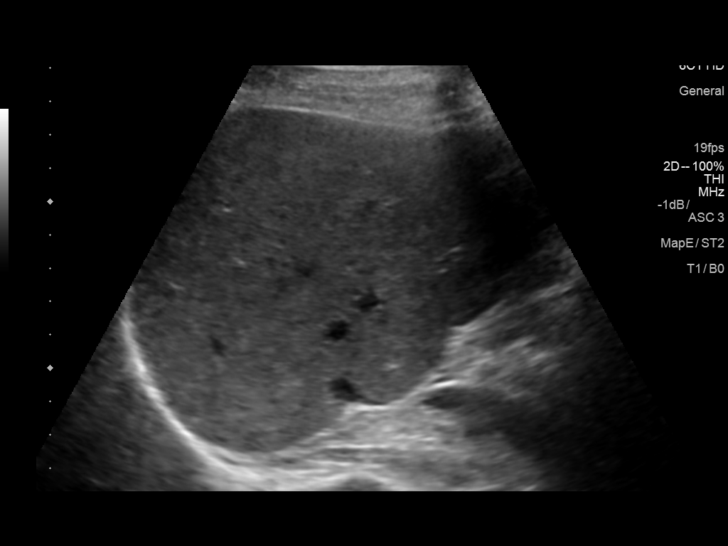
[im 28/38]
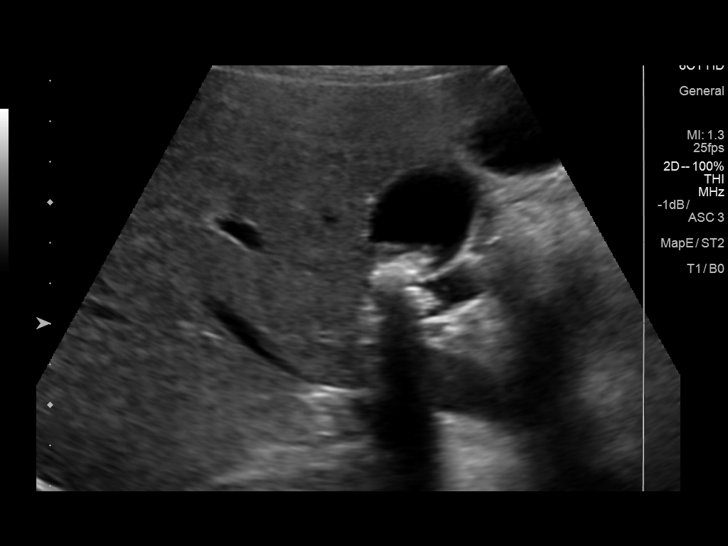
[im 31/38]
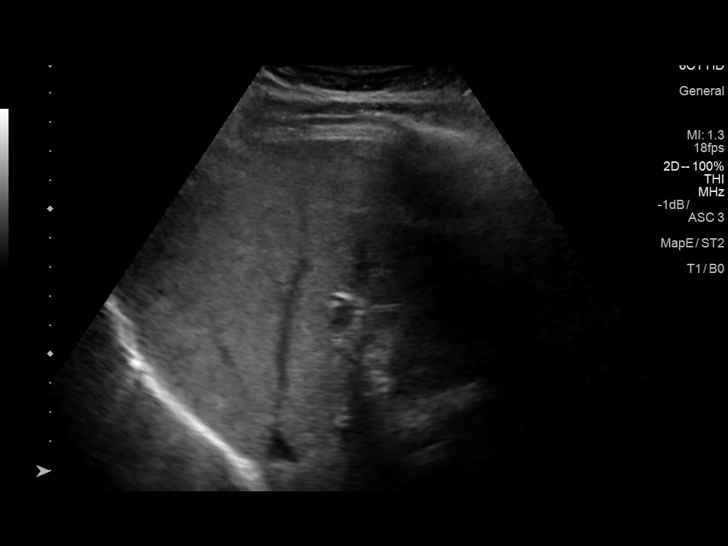
[im 34/38]
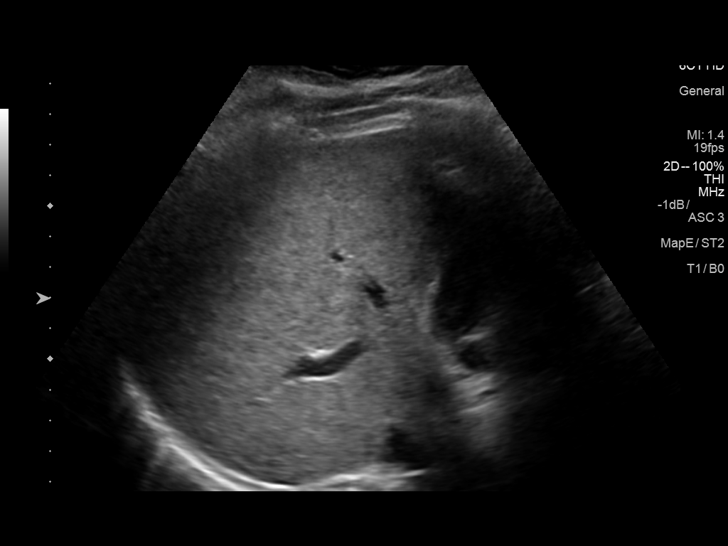
[im 38/38]
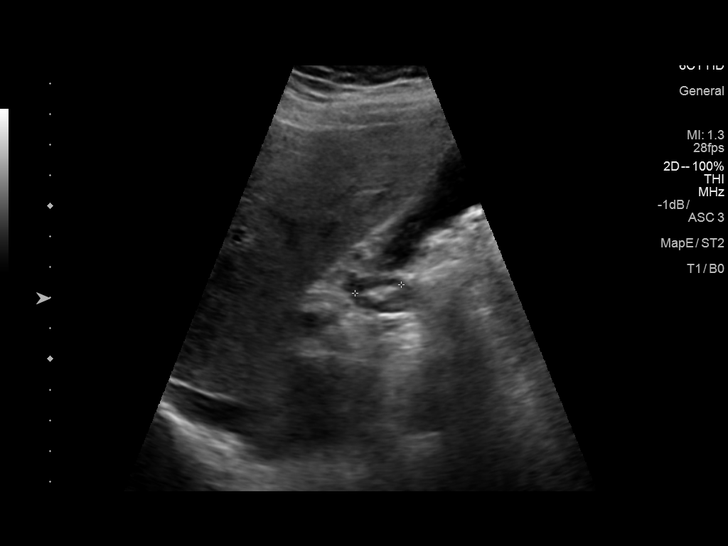

[14 of 25 positions shown; findings below may reference images not displayed]

FINDINGS: Gallbladder:

Gallstones noted. Gallstones measure up to 2.1 cm. Non mobile stone
noted in the neck of the gallbladder. Gallbladder wall thickness
mm. Negative Murphy sign.

Common bile duct:

Diameter: 2.4 mm

Liver:

Liver echotexture patch that slightly heterogeneous hepatic
echotexture suggesting fatty infiltration and/or hepatocellular
disease. Slightly lobular hepatic border. Cirrhosis cannot be
excluded. Portal vein patent
IMPRESSION: 1. Multiple gallstones. Non mobile stone in the neck of the
gallbladder noted. No gallbladder distention. Negative Murphy sign.
No biliary distention.

2. Continues hepatic parenchymal pattern suggesting fatty
infiltration and/or hepatocellular disease. Slightly lobular hepatic
border periods cirrhosis cannot be excluded .

## 2019-01-16 ENCOUNTER — Ambulatory Visit: Payer: BC Managed Care – PPO | Admitting: Family Medicine

## 2019-04-23 ENCOUNTER — Encounter: Payer: Self-pay | Admitting: Family Medicine

## 2019-04-23 ENCOUNTER — Ambulatory Visit: Payer: BC Managed Care – PPO | Admitting: Family Medicine

## 2019-04-23 ENCOUNTER — Other Ambulatory Visit: Payer: Self-pay

## 2019-04-23 VITALS — BP 120/80 | HR 81 | Temp 99.0°F | Ht 69.69 in | Wt 218.0 lb

## 2019-04-23 DIAGNOSIS — L84 Corns and callosities: Secondary | ICD-10-CM

## 2019-04-23 DIAGNOSIS — E119 Type 2 diabetes mellitus without complications: Secondary | ICD-10-CM | POA: Diagnosis not present

## 2019-04-23 DIAGNOSIS — I10 Essential (primary) hypertension: Secondary | ICD-10-CM

## 2019-04-23 DIAGNOSIS — E782 Mixed hyperlipidemia: Secondary | ICD-10-CM

## 2019-04-23 MED ORDER — METFORMIN HCL ER 750 MG PO TB24: 750.0000 mg | ORAL_TABLET | Freq: Every day | ORAL | 1 refills | Status: DC

## 2019-04-23 MED ORDER — AMLODIPINE BESYLATE 5 MG PO TABS: 5.0000 mg | ORAL_TABLET | Freq: Every day | ORAL | 1 refills | Status: DC

## 2019-04-23 MED ORDER — FLUTICASONE PROPIONATE 50 MCG/ACT NA SUSP: 1.0000 | Freq: Every day | NASAL | 2 refills | Status: DC

## 2019-04-23 MED ORDER — ONETOUCH VERIO VI STRP: ORAL_STRIP | 6 refills | Status: AC

## 2019-04-23 MED ORDER — ATORVASTATIN CALCIUM 20 MG PO TABS: 20.0000 mg | ORAL_TABLET | Freq: Every day | ORAL | 1 refills | Status: DC

## 2019-04-23 NOTE — Progress Notes (Signed)
9/29/20205:08 PM  Alejandro [name] March 24, 1961, 58 y.o., male [id number]  Chief Complaint  Patient presents with  . Diabetes  . Hypertension  . Medication Refill    medication pended    HPI:   Patient is a 58 y.o. male with past medical history significant for DM2, HTN, HLP, HCV s/p treatment who presents today for routine followup  Last OV march 2020 Has been working on weight loss Checks fasting cbgs, 120s Has callus that is causing pain on bottom of both feet Denies any vision issues, due for routine eye exam Declines flu vaccine  Lab Results  Component Value Date   HGBA1C 6.5 (H) 11/09/2018   HGBA1C CANCELED 10/08/2018   HGBA1C 6.3 (H) 01/06/2018   Lab Results  Component Value Date   MICROALBUR <0.2 08/26/2015   LDLCALC 76 10/08/2018   CREATININE 1.16 10/08/2018    Depression screen PHQ 2/9 10/08/2018 04/11/2018 01/06/2018  Decreased Interest 0 0 0  Down, Depressed, Hopeless 0 0 0  PHQ - 2 Score 0 0 0    Fall Risk  10/08/2018 04/11/2018 01/06/2018 06/24/2017 03/11/2017  Falls in the past year? 0 No No No No  Injury with Fall? 0 - - - -     Allergies  Allergen Reactions  . Tylenol [Acetaminophen] Hypertension    Prior to Admission medications   Medication Sig Start Date End Date Taking? Authorizing Provider  amLODipine (NORVASC) 5 MG tablet Take 5 mg by mouth daily. 02/26/19   [provider]  atorvastatin (LIPITOR) 20 MG tablet Take 20 mg by mouth daily. 02/26/19   [provider]  Blood Glucose Monitoring Suppl (ONE TOUCH ULTRA 2) w/Device KIT USE AS DIRECTED TO TEST BLOOD SUGAR IN THE MORNING 08/07/16   Ivar Drape D, PA  Blood Glucose Monitoring Suppl KIT 1 application by Does not apply route every morning. 04/01/16   Ivar Drape D, PA  Blood Pressure Monitoring (BLOOD PRESSURE CUFF) MISC Use as directed 07/07/17   Shawnee Knapp, MD  fluticasone [facility]) 50 MCG/ACT nasal spray Place 1-2 sprays into both nostrils daily. 11/09/18    Rutherford Guys, MD  glucose blood (ONETOUCH VERIO) test strip USE ONE STRIP TO CHECK GLUCOSE TWICE DAILY 11/09/18   Rutherford Guys, MD  hydrochlorothiazide (HYDRODIURIL) 25 MG tablet Take 1 tablet (25 mg total) by mouth daily. 11/26/18   Rutherford Guys, MD  metFORMIN (GLUCOPHAGE-XR) 750 MG 24 hr tablet Take 1 tablet (750 mg total) by mouth daily with breakfast. 11/26/18   Rutherford Guys, MD  triamcinolone cream (KENALOG) 0.1 % Apply 1 application topically 2 (two) times daily. 09/03/18   Wardell Honour, MD    Past Medical History:  Diagnosis Date  . Diabetes mellitus without complication ([place])   . Fatty liver   . Hepatitis C   . Hypertension   . Tubular adenoma of colon 12/2015    Past Surgical History:  Procedure Laterality Date  . COLONOSCOPY      Social History   Tobacco Use  . Smoking status: Former Research scientist (life sciences)  . Smokeless tobacco: Never Used  Substance Use Topics  . Alcohol use: Yes    Comment: occasional wine    Family History  Problem Relation Age of Onset  . Heart disease Mother 40       CAD/AMI  . Diabetes Mother   . Hypertension Mother   . Diabetes Maternal Grandfather   . Diabetes Paternal Grandfather   . Diabetes Father   .  Diabetes Sister   . Hypertension Sister   . Hypertension Brother   . Diabetes Brother   . Colon cancer Neg Hx   . Esophageal cancer Neg Hx   . Stomach cancer Neg Hx   . Rectal cancer Neg Hx     Review of Systems  Constitutional: Negative for chills and fever.  Respiratory: Negative for cough and shortness of breath.   Cardiovascular: Negative for chest pain, palpitations and leg swelling.  Gastrointestinal: Negative for abdominal pain, nausea and vomiting.  per hpi   OBJECTIVE:  Today's Vitals   04/23/19 1650  BP: 120/80  [name]: 81  Temp: 99 F (37.2 C)  SpO2: 99%  Weight: 218 lb (98.9 kg)  Height: 5' 9.69" (1.77 m)   Body mass index is 31.56 kg/m.  Wt Readings from Last 3 Encounters:  04/23/19 218 lb (98.9 kg)   10/08/18 222 lb (100.7 kg)  09/03/18 214 lb (97.1 kg)    Physical Exam Vitals signs and nursing note reviewed.  Constitutional:      Appearance: He is well-developed.  HENT:     Head: Normocephalic and atraumatic.  Eyes:     Conjunctiva/sclera: Conjunctivae normal.     Pupils: Pupils are equal, round, and reactive to light.  Neck:     Musculoskeletal: Neck supple.  Cardiovascular:     Rate and Rhythm: Normal rate and regular rhythm.     Heart sounds: No murmur. No friction rub. No gallop.   Pulmonary:     Effort: Pulmonary effort is normal.     Breath sounds: Normal breath sounds. No wheezing or rales.  Skin:    General: Skin is warm and dry.  Neurological:     Mental Status: He is alert and oriented to person, place, and time.     Diabetic Foot Form - Detailed   Diabetic Foot Exam - detailed Can the patient see the bottom of their feet?: No Are the shoes appropriate in style and fit?: No Is there swelling or and abnormal foot shape?: No Is there a claw toe deformity?: No Is there elevated skin temparature?: No Is there foot or ankle muscle weakness?: No Normal Range of Motion: No Semmes-Weinstein Monofilament Test R Site 1-Great Toe: Neg L Site 1-Great Toe: Neg        No results found for this or any previous visit (from the past 24 hour(s)).  No results found.   ASSESSMENT and PLAN  1. Type 2 diabetes mellitus without complication, without long-term current use of insulin ([place]) Checking labs today, medications will be adjusted as needed. Cont with LFM - glucose blood (ONETOUCH VERIO) test strip; USE ONE STRIP TO CHECK GLUCOSE TWICE DAILY - Microalbumin / creatinine urine ratio - Lipid panel - CMP14+EGFR - Hemoglobin A1c - TSH - Ambulatory referral to Ophthalmology  2. Essential hypertension Controlled. Continue current regime.  - Lipid panel - CMP14+EGFR  3. Mixed hyperlipidemia Checking labs today, medications will be adjusted as needed.  -  Lipid panel - CMP14+EGFR  4. Callus of foot - Ambulatory referral to Podiatry  Other orders - fluticasone (FLONASE) 50 MCG/ACT nasal spray; Place 1-2 sprays into both nostrils daily. - metFORMIN (GLUCOPHAGE-XR) 750 MG 24 hr tablet; Take 1 tablet (750 mg total) by mouth daily with breakfast. - amLODipine (NORVASC) 5 MG tablet; Take 1 tablet (5 mg total) by mouth daily. - atorvastatin (LIPITOR) 20 MG tablet; Take 1 tablet (20 mg total) by mouth daily.  No follow-ups on file.  Rahn Lacuesta M Santiago, MD [facility] [address] [facility] [address] [place], [place] [address] Ph.  [phone] Fax [phone]   

## 2019-04-24 LAB — CMP14+EGFR
ALT: 13 IU/L (ref 0–44)
AST: 23 IU/L (ref 0–40)
Albumin/Globulin Ratio: 1.7 (ref 1.2–2.2)
Albumin: 4.8 g/dL (ref 3.8–4.9)
Alkaline Phosphatase: 79 IU/L (ref 39–117)
BUN/Creatinine Ratio: 13 (ref 9–20)
BUN: 13 mg/dL (ref 6–24)
Bilirubin Total: <0.2 mg/dL (ref 0.0–1.2)
CO2: 24 mmol/L (ref 20–29)
Calcium: 9.8 mg/dL (ref 8.7–10.2)
Chloride: 97 mmol/L (ref 96–106)
Creatinine, Ser: 1.01 mg/dL (ref 0.76–1.27)
GFR calc Af Amer: 94 mL/min/{1.73_m2} (ref >59)
GFR calc non Af Amer: 82 mL/min/{1.73_m2} (ref >59)
Globulin, Total: 2.8 g/dL (ref 1.5–4.5)
Glucose: 98 mg/dL (ref 65–99)
Potassium: 3.7 mmol/L (ref 3.5–5.2)
Sodium: 138 mmol/L (ref 134–144)
Total Protein: 7.6 g/dL (ref 6.0–8.5)

## 2019-04-24 LAB — LIPID PANEL
Chol/HDL Ratio: 2.6 ratio (ref 0.0–5.0)
Cholesterol, Total: 138 mg/dL (ref 100–199)
HDL: 53 mg/dL (ref >39)
LDL Chol Calc (NIH): 67 mg/dL (ref 0–99)
Triglycerides: 94 mg/dL (ref 0–149)
VLDL Cholesterol Cal: 18 mg/dL (ref 5–40)

## 2019-04-24 LAB — MICROALBUMIN / CREATININE URINE RATIO
Creatinine, Urine: 110.5 mg/dL
Microalb/Creat Ratio: 10 mg/g creat (ref 0–29)
Microalbumin, Urine: 11.1 ug/mL

## 2019-04-24 LAB — HEMOGLOBIN A1C
Est. average glucose Bld gHb Est-mCnc: 140 mg/dL
Hgb A1c MFr Bld: 6.5 % — ABNORMAL HIGH (ref 4.8–5.6)

## 2019-04-24 LAB — TSH: TSH: 3.99 u[IU]/mL (ref 0.450–4.500)

## 2019-05-07 ENCOUNTER — Encounter: Payer: Self-pay | Admitting: Radiology

## 2019-09-08 ENCOUNTER — Encounter (HOSPITAL_COMMUNITY): Payer: Self-pay | Admitting: Emergency Medicine

## 2019-09-08 ENCOUNTER — Other Ambulatory Visit: Payer: Self-pay

## 2019-09-08 ENCOUNTER — Ambulatory Visit (HOSPITAL_COMMUNITY)
Admission: EM | Admit: 2019-09-08 | Discharge: 2019-09-08 | Disposition: A | Discharge status: Home / Self Care | Payer: BC Managed Care – PPO | Attending: Urgent Care | Admitting: Urgent Care

## 2019-09-08 DIAGNOSIS — Z20822 Contact with and (suspected) exposure to covid-19: Secondary | ICD-10-CM | POA: Insufficient documentation

## 2019-09-08 NOTE — Discharge Instructions (Signed)
We will notify you of your COVID-19 test results as they arrive and may take between 2 to 7 days.  In the meantime, if you develop symptoms including fever, chest pain, shortness of breath despite our current treatment plan then please report to the emergency room as this may be a sign of worsening status from possible COVID-19 infection.  For sore throat or cough try using a honey-based tea. Use 3 teaspoons of honey with juice squeezed from half lemon. Place shaved pieces of ginger into 1/2-1 cup of water and warm over stove top. Then mix the ingredients and repeat every 4 hours as needed. Please take Tylenol 500mg  every 6 hours. Hydrate very well with at least 2 liters of water. Eat light meals such as soups to replenish electrolytes and soft fruits, veggies. Start an antihistamine like Zyrtec (cetirizine) at 10mg  daily for postnasal drainage, sinus congestion.

## 2019-09-08 NOTE — ED Provider Notes (Signed)
[place]   MRN: [id number] DOB: October 03, 1960  Subjective:   Alejandro [name] is a 59 y.o. male presenting for COVID exposure 3 days ago. Denies having any sx.   No current facility-administered medications for this encounter.  Current Outpatient Medications:  .  amLODipine (NORVASC) 5 MG tablet, Take 1 tablet (5 mg total) by mouth daily., Disp: 90 tablet, Rfl: 1 .  atorvastatin (LIPITOR) 20 MG tablet, Take 1 tablet (20 mg total) by mouth daily., Disp: 90 tablet, Rfl: 1 .  fluticasone (FLONASE) 50 MCG/ACT nasal spray, Place 1-2 sprays into both nostrils daily., Disp: 16 g, Rfl: 2 .  hydrochlorothiazide (HYDRODIURIL) 25 MG tablet, Take 1 tablet (25 mg total) by mouth daily., Disp: 90 tablet, Rfl: 3 .  metFORMIN (GLUCOPHAGE-XR) 750 MG 24 hr tablet, Take 1 tablet (750 mg total) by mouth daily with breakfast., Disp: 90 tablet, Rfl: 1 .  Blood Glucose Monitoring Suppl (ONE TOUCH ULTRA 2) w/Device KIT, USE AS DIRECTED TO TEST BLOOD SUGAR IN THE MORNING, Disp: 1 each, Rfl: 0 .  Blood Glucose Monitoring Suppl KIT, 1 application by Does not apply route every morning., Disp: 100 each, Rfl: 3 .  Blood Pressure Monitoring (BLOOD PRESSURE CUFF) MISC, Use as directed, Disp: 1 each, Rfl: 0 .  glucose blood (ONETOUCH VERIO) test strip, USE ONE STRIP TO CHECK GLUCOSE TWICE DAILY, Disp: 150 each, Rfl: 6 .  triamcinolone cream (KENALOG) 0.1 %, Apply 1 application topically 2 (two) times daily., Disp: 30 g, Rfl: 0   Allergies  Allergen Reactions  . Tylenol [Acetaminophen] Hypertension    Past Medical History:  Diagnosis Date  . Diabetes mellitus without complication ([place])   . Fatty liver   . Hepatitis C   . Hypertension   . Tubular adenoma of colon 12/2015     Past Surgical History:  Procedure Laterality Date  . COLONOSCOPY      Family History  Problem Relation Age of Onset  . Heart disease Mother 30       CAD/AMI  . Diabetes Mother   . Hypertension Mother   . Diabetes Maternal  Grandfather   . Diabetes Paternal Grandfather   . Diabetes Father   . Diabetes Sister   . Hypertension Sister   . Hypertension Brother   . Diabetes Brother   . Colon cancer Neg Hx   . Esophageal cancer Neg Hx   . Stomach cancer Neg Hx   . Rectal cancer Neg Hx     Social History   Tobacco Use  . Smoking status: Former Research scientist (life sciences)  . Smokeless tobacco: Never Used  Substance Use Topics  . Alcohol use: Yes    Comment: occasional wine  . Drug use: No    ROS   Objective:   Vitals: BP (!) 148/83   Pulse 78   Temp 97.8 F (36.6 C) (Oral)   Resp 16   SpO2 97%   Physical Exam Constitutional:      General: He is not in acute distress.    Appearance: Normal appearance. He is well-developed and normal weight. He is not ill-appearing, toxic-appearing or diaphoretic.  HENT:     Head: Normocephalic and atraumatic.     Right Ear: External ear normal.     Left Ear: External ear normal.     Nose: Nose normal.     Mouth/Throat:     Pharynx: Oropharynx is clear.  Eyes:     General: No scleral icterus.       Right  eye: No discharge.        Left eye: No discharge.     Extraocular Movements: Extraocular movements intact.     Pupils: Pupils are equal, round, and reactive to light.  Cardiovascular:     Rate and Rhythm: Normal rate.  Pulmonary:     Effort: Pulmonary effort is normal.  Musculoskeletal:     Cervical back: Normal range of motion.  Neurological:     Mental Status: He is alert and oriented to person, place, and time.  Psychiatric:        Mood and Affect: Mood normal.        Behavior: Behavior normal.        Thought Content: Thought content normal.        Judgment: Judgment normal.      Assessment and Plan :   1. Exposure to COVID-19 virus     Counseled patient on nature of COVID-19 including modes of transmission, diagnostic testing, management and supportive care.  Counseled on medications used for symptomatic relief. COVID 19 testing is pending. Counseled  patient on potential for adverse effects with medications prescribed/recommended today, ER and return-to-clinic precautions discussed, patient verbalized understanding.     Jaynee Eagles, [place] 09/08/19 1839

## 2019-09-08 NOTE — ED Triage Notes (Signed)
PT has COVID exposure Thursday. He is here for COVID testing. No symptoms.

## 2019-09-09 LAB — NOVEL CORONAVIRUS, NAA (HOSP ORDER, SEND-OUT TO REF LAB; TAT 18-24 HRS): SARS-CoV-2, NAA: NOT DETECTED

## 2019-10-21 ENCOUNTER — Ambulatory Visit: Payer: BC Managed Care – PPO | Admitting: Family Medicine

## 2019-10-22 ENCOUNTER — Encounter: Payer: Self-pay | Admitting: Family Medicine

## 2019-10-30 ENCOUNTER — Other Ambulatory Visit: Payer: Self-pay

## 2019-10-30 ENCOUNTER — Ambulatory Visit (INDEPENDENT_AMBULATORY_CARE_PROVIDER_SITE_OTHER): Payer: BC Managed Care – PPO | Admitting: Family Medicine

## 2019-10-30 ENCOUNTER — Encounter: Payer: Self-pay | Admitting: Family Medicine

## 2019-10-30 VITALS — BP 133/85 | HR 77 | Temp 98.3°F | Ht 70.0 in | Wt 220.8 lb

## 2019-10-30 DIAGNOSIS — K625 Hemorrhage of anus and rectum: Secondary | ICD-10-CM | POA: Diagnosis not present

## 2019-10-30 DIAGNOSIS — K644 Residual hemorrhoidal skin tags: Secondary | ICD-10-CM

## 2019-10-30 DIAGNOSIS — E119 Type 2 diabetes mellitus without complications: Secondary | ICD-10-CM | POA: Diagnosis not present

## 2019-10-30 DIAGNOSIS — E782 Mixed hyperlipidemia: Secondary | ICD-10-CM

## 2019-10-30 DIAGNOSIS — I10 Essential (primary) hypertension: Secondary | ICD-10-CM | POA: Diagnosis not present

## 2019-10-30 DIAGNOSIS — Z125 Encounter for screening for malignant neoplasm of prostate: Secondary | ICD-10-CM

## 2019-10-30 MED ORDER — HYDROCORTISONE ACETATE 25 MG RE SUPP: 25.0000 mg | Freq: Two times a day (BID) | RECTAL | 1 refills | Status: DC

## 2019-10-30 NOTE — Patient Instructions (Signed)
° ° ° °  If you have lab work done today you will be contacted with your lab results within the next 2 weeks.  If you have not heard from us then please contact us. The fastest way to get your results is to register for My Chart. ° ° °IF you received an x-ray today, you will receive an invoice from [facility]. Please contact [facility] at [phone] with questions or concerns regarding your invoice.  ° °IF you received labwork today, you will receive an invoice from [facility]. Please contact [facility] at [phone] with questions or concerns regarding your invoice.  ° °Our billing staff will not be able to assist you with questions regarding bills from these companies. ° °You will be contacted with the lab results as soon as they are available. The fastest way to get your results is to activate your My Chart account. Instructions are located on the last page of this paperwork. If you have not heard from us regarding the results in 2 weeks, please contact this office. °  ° ° ° °

## 2019-10-30 NOTE — Progress Notes (Signed)
4/7/20213:12 PM  Brigitte Pulse [birth year], 59 y.o., male [id number]  Chief Complaint  Patient presents with  . Diabetes    6 m f/u   . Hypertension  . Blood In Stools    last two days bright red    HPI:   Patient is a 59 y.o. male with past medical history significant for DM2, HTN, HLP, HCV s/p treatment who presents today for routine followup  Last OV Sept 2020  He reports that overall he is doing well He continues to work on his diet He does not smoke Active but not formally exercising Check cbgs at home, fasting 108-120s Denies any sx of lows, polydipsia or polyuria  Blood in stool x 2 days, last night it was not associated with BM, associated with clots No constipation or diarrhea No abd or rectal pain, no nausea or vomiting, no fever or chills No dysuria   Colonoscopy 2017  - One 6 mm polyp in the descending colon, removed with a cold snare. Resected and retrieved. - Three 4 to 5 mm polyps in the descending colon, in the transverse colon and in the ascending colon, removed with a cold forceps. Resected and retrieved. - Moderate diverticulosis in the sigmoid colon and in the descending colon. - Internal hemorrhoids.  Lab Results  Component Value Date   HGBA1C 6.5 (H) 04/23/2019   HGBA1C 6.5 (H) 11/09/2018   HGBA1C CANCELED 10/08/2018   Lab Results  Component Value Date   MICROALBUR <0.2 08/26/2015   LDLCALC 67 04/23/2019   CREATININE 1.01 04/23/2019    Depression screen PHQ 2/9 10/30/2019 10/08/2018 04/11/2018  Decreased Interest 0 0 0  Down, Depressed, Hopeless 0 0 0  PHQ - 2 Score 0 0 0    Fall Risk  10/30/2019 10/08/2018 04/11/2018 01/06/2018 06/24/2017  Falls in the past year? 0 0 No No No  Number falls in past yr: 0 - - - -  Injury with Fall? 0 0 - - -  Follow up Falls evaluation completed - - - -     Allergies  Allergen Reactions  . Tylenol [Acetaminophen] Hypertension    Prior to Admission medications   Medication Sig Start Date End Date  Taking? Authorizing Provider  amLODipine (NORVASC) 5 MG tablet Take 1 tablet (5 mg total) by mouth daily. 04/23/19  Yes Rutherford Guys, MD  atorvastatin (LIPITOR) 20 MG tablet Take 1 tablet (20 mg total) by mouth daily. 04/23/19  Yes Rutherford Guys, MD  Blood Glucose Monitoring Suppl (ONE TOUCH ULTRA 2) w/Device KIT USE AS DIRECTED TO TEST BLOOD SUGAR IN THE MORNING 08/07/16  Yes English, Stephanie D, PA  Blood Glucose Monitoring Suppl KIT 1 application by Does not apply route every morning. 04/01/16  Yes Ivar Drape D, PA  Blood Pressure Monitoring (BLOOD PRESSURE CUFF) MISC Use as directed 07/07/17  Yes Shawnee Knapp, MD  fluticasone [facility]) 50 MCG/ACT nasal spray Place 1-2 sprays into both nostrils daily. 04/23/19  Yes Rutherford Guys, MD  glucose blood (ONETOUCH VERIO) test strip USE ONE STRIP TO CHECK GLUCOSE TWICE DAILY 04/23/19  Yes Rutherford Guys, MD  hydrochlorothiazide (HYDRODIURIL) 25 MG tablet Take 1 tablet (25 mg total) by mouth daily. 11/26/18  Yes Rutherford Guys, MD  metFORMIN (GLUCOPHAGE-XR) 750 MG 24 hr tablet Take 1 tablet (750 mg total) by mouth daily with breakfast. 04/23/19  Yes Rutherford Guys, MD    Past Medical History:  Diagnosis Date  . Diabetes mellitus without  complication ([place])   . Fatty liver   . Hepatitis C   . Hypertension   . Tubular adenoma of colon 12/2015    Past Surgical History:  Procedure Laterality Date  . COLONOSCOPY      Social History   Tobacco Use  . Smoking status: Former Research scientist (life sciences)  . Smokeless tobacco: Never Used  Substance Use Topics  . Alcohol use: Yes    Comment: occasional wine    Family History  Problem Relation Age of Onset  . Heart disease Mother 71       CAD/AMI  . Diabetes Mother   . Hypertension Mother   . Diabetes Maternal Grandfather   . Diabetes Paternal Grandfather   . Diabetes Father   . Diabetes Sister   . Hypertension Sister   . Hypertension Brother   . Diabetes Brother   . Colon cancer Neg Hx   .  Esophageal cancer Neg Hx   . Stomach cancer Neg Hx   . Rectal cancer Neg Hx     Review of Systems  Constitutional: Negative for chills and fever.  Respiratory: Negative for cough and shortness of breath.   Cardiovascular: Negative for chest pain, palpitations and leg swelling.  Gastrointestinal: Negative for abdominal pain, nausea and vomiting.  Per hpi  OBJECTIVE:  Today's Vitals   10/30/19 1506  BP: 133/85  Pulse: 77  Temp: 98.3 F (36.8 C)  TempSrc: Temporal  SpO2: 99%  Weight: 220 lb 12.8 oz (100.2 kg)  Height: '5\' 10"'$  (1.778 m)   Body mass index is 31.68 kg/m.   Wt Readings from Last 3 Encounters:  10/30/19 220 lb 12.8 oz (100.2 kg)  04/23/19 218 lb (98.9 kg)  10/08/18 222 lb (100.7 kg)     Physical Exam Vitals and nursing note reviewed. Exam conducted with a chaperone present.  Constitutional:      Appearance: He is well-developed.  HENT:     Head: Normocephalic and atraumatic.  Eyes:     Conjunctiva/sclera: Conjunctivae normal.     Pupils: Pupils are equal, round, and reactive to light.  Cardiovascular:     Rate and Rhythm: Normal rate and regular rhythm.     Heart sounds: No murmur. No friction rub. No gallop.   Pulmonary:     Effort: Pulmonary effort is normal.     Breath sounds: Normal breath sounds. No wheezing, rhonchi or rales.  Abdominal:     General: Bowel sounds are normal. There is no distension.     Palpations: Abdomen is soft.     Tenderness: There is no abdominal tenderness.  Genitourinary:    Prostate: Not enlarged, not tender and no nodules present.     Rectum: External hemorrhoid (bleeding) and internal hemorrhoid present. No tenderness or anal fissure. Normal anal tone.  Musculoskeletal:     Cervical back: Neck supple.  Skin:    General: Skin is warm and dry.  Neurological:     Mental Status: He is alert and oriented to person, place, and time.     No results found for this or any previous visit (from the past 24 hour(s)).  No  results found.   ASSESSMENT and PLAN  1. Type 2 diabetes mellitus without complication, without long-term current use of insulin ([place]) Checking labs today, medications will be adjusted as needed. Cont working on LFM - Hemoglobin A1c  2. Essential hypertension Controlled. Continue current regime.  - CMP14+EGFR  3. Mixed hyperlipidemia Checking labs today, medications will be adjusted as needed.  -  Lipid panel  4. Rectal bleeding 6. External hemorrhoid, bleeding Discussed supportive measures, new meds r/se/b and RTC precautions. - CBC - hydrocortisone (ANUSOL-HC) 25 MG suppository; Place 1 suppository (25 mg total) rectally 2 (two) times daily.  5. Screening for prostate cancer - PSA   Return in about 6 months (around 04/30/2020).    Rutherford Guys, MD Primary Care at [place], [place] [address] Ph.  [phone] Fax [phone]

## 2019-10-31 LAB — CMP14+EGFR
ALT: 14 IU/L (ref 0–44)
AST: 23 IU/L (ref 0–40)
Albumin/Globulin Ratio: 1.9 (ref 1.2–2.2)
Albumin: 5.1 g/dL — ABNORMAL HIGH (ref 3.8–4.9)
Alkaline Phosphatase: 86 IU/L (ref 39–117)
BUN/Creatinine Ratio: 12 (ref 9–20)
BUN: 14 mg/dL (ref 6–24)
Bilirubin Total: 0.3 mg/dL (ref 0.0–1.2)
CO2: 26 mmol/L (ref 20–29)
Calcium: 10 mg/dL (ref 8.7–10.2)
Chloride: 101 mmol/L (ref 96–106)
Creatinine, Ser: 1.13 mg/dL (ref 0.76–1.27)
GFR calc Af Amer: 82 mL/min/{1.73_m2} (ref >59)
GFR calc non Af Amer: 71 mL/min/{1.73_m2} (ref >59)
Globulin, Total: 2.7 g/dL (ref 1.5–4.5)
Glucose: 83 mg/dL (ref 65–99)
Potassium: 4.1 mmol/L (ref 3.5–5.2)
Sodium: 142 mmol/L (ref 134–144)
Total Protein: 7.8 g/dL (ref 6.0–8.5)

## 2019-10-31 LAB — LIPID PANEL
Chol/HDL Ratio: 3.1 ratio (ref 0.0–5.0)
Cholesterol, Total: 145 mg/dL (ref 100–199)
HDL: 47 mg/dL (ref >39)
LDL Chol Calc (NIH): 76 mg/dL (ref 0–99)
Triglycerides: 120 mg/dL (ref 0–149)
VLDL Cholesterol Cal: 22 mg/dL (ref 5–40)

## 2019-10-31 LAB — CBC
Hematocrit: 44.7 % (ref 37.5–51.0)
Hemoglobin: 14.5 g/dL (ref 13.0–17.7)
MCH: 27.9 pg (ref 26.6–33.0)
MCHC: 32.4 g/dL (ref 31.5–35.7)
MCV: 86 fL (ref 79–97)
Platelets: 244 10*3/uL (ref 150–450)
RBC: 5.2 x10E6/uL (ref 4.14–5.80)
RDW: 13.1 % (ref 11.6–15.4)
WBC: 8.4 10*3/uL (ref 3.4–10.8)

## 2019-10-31 LAB — HEMOGLOBIN A1C
Est. average glucose Bld gHb Est-mCnc: 151 mg/dL
Hgb A1c MFr Bld: 6.9 % — ABNORMAL HIGH (ref 4.8–5.6)

## 2019-10-31 LAB — PSA: Prostate Specific Ag, Serum: 0.5 ng/mL (ref 0.0–4.0)

## 2019-11-26 ENCOUNTER — Other Ambulatory Visit: Payer: Self-pay | Admitting: Family Medicine

## 2019-11-26 NOTE — Telephone Encounter (Signed)
Requested Prescriptions  Pending Prescriptions Disp Refills  . fluticasone (FLONASE) 50 MCG/ACT nasal spray [Pharmacy Med Name: Fluticasone Propionate 50 MCG/ACT Nasal Suspension] 16 g 1    Sig: USE 1 TO 2 SPRAY(S) IN EACH NOSTRIL ONCE DAILY     Ear, Nose, and Throat: Nasal Preparations - Corticosteroids Passed - 11/26/2019  4:56 PM      Passed - Valid encounter within last 12 months    Recent Outpatient Visits          3 weeks ago Type 2 diabetes mellitus without complication, without long-term current use of insulin ([place])   [facility] at [facility], Lilia Argue, MD   7 months ago Type 2 diabetes mellitus without complication, without long-term current use of insulin [facility])   [facility] at [facility], Lilia Argue, MD   1 year ago Type 2 diabetes mellitus without complication, without long-term current use of insulin ([place])   Primary Care at [facility], [place] A, MD   1 year ago Type 2 diabetes mellitus without complication, without long-term current use of insulin [facility])   [facility] at [facility], Lilia Argue, MD   1 year ago Essential hypertension   Primary Care at [facility], [place], [place]      Future Appointments            In 5 months Pamella Pert, Lilia Argue, MD Primary Care at [place], [facility]

## 2020-01-09 ENCOUNTER — Other Ambulatory Visit: Payer: Self-pay | Admitting: Family Medicine

## 2020-01-10 ENCOUNTER — Other Ambulatory Visit: Payer: Self-pay | Admitting: Family Medicine

## 2020-01-10 DIAGNOSIS — I10 Essential (primary) hypertension: Secondary | ICD-10-CM

## 2020-02-21 ENCOUNTER — Other Ambulatory Visit: Payer: Self-pay | Admitting: Family Medicine

## 2020-03-05 ENCOUNTER — Other Ambulatory Visit: Payer: Self-pay

## 2020-03-05 ENCOUNTER — Telehealth: Payer: Self-pay | Admitting: Family Medicine

## 2020-03-05 MED ORDER — FLUTICASONE PROPIONATE 50 MCG/ACT NA SUSP: NASAL | 0 refills | Status: DC

## 2020-03-05 NOTE — Telephone Encounter (Signed)
What is the name of the medication? fluticasone (FLONASE) 50 MCG/ACT nasal spray [[id number]]    Have you contacted your pharmacy to request a refill? Yes, they said he was out of refills.  Which pharmacy would you like this sent to? [facility] [place] [address]  [address]) [place] [address]  Phone:  [phone] Fax:  [phone]       Patient notified that their request is being sent to the clinical staff for review and that they should receive a call once it is complete. If they do not receive a call within 72 hours they can check with their pharmacy or our office.

## 2020-03-17 ENCOUNTER — Other Ambulatory Visit: Payer: Self-pay

## 2020-03-17 ENCOUNTER — Encounter: Payer: Self-pay | Admitting: Family Medicine

## 2020-03-17 ENCOUNTER — Ambulatory Visit: Payer: BC Managed Care – PPO | Admitting: Family Medicine

## 2020-03-17 VITALS — BP 159/92 | HR 78 | Temp 98.7°F | Ht 70.0 in | Wt 229.0 lb

## 2020-03-17 DIAGNOSIS — M79662 Pain in left lower leg: Secondary | ICD-10-CM

## 2020-03-17 MED ORDER — DOXYCYCLINE HYCLATE 100 MG PO TABS: 100.0000 mg | ORAL_TABLET | Freq: Two times a day (BID) | ORAL | 0 refills | Status: DC

## 2020-03-17 MED ORDER — TRAMADOL HCL 50 MG PO TABS: 50.0000 mg | ORAL_TABLET | Freq: Every evening | ORAL | 0 refills | Status: AC | PRN

## 2020-03-17 NOTE — Patient Instructions (Signed)

## 2020-03-17 NOTE — Progress Notes (Signed)
8/24/20219:30 AM  Alejandro [name] [birth year], 59 y.o., male [id number]  Chief Complaint  Patient presents with  . Pain    left foot with tingling. No numbness but some pain when he walks. Thinks he may be experiencing plantar     HPI:   Patient is a 59 y.o. male with past medical history significant for DM2, HTN, HLP, HCV s/p treatmentwho presents today for left foot pain  Patient reports that 1 month ago he had sudden pain along medial distal lower leg with skin changes He states that pain is worse with going up/down steps and at night He denies any trauma or inciting event He reports that pain has improved on its own but still present Denies any numbness/tingling in his feet  Lab Results  Component Value Date   HGBA1C 6.9 (H) 10/30/2019   HGBA1C 6.5 (H) 04/23/2019   HGBA1C 6.5 (H) 11/09/2018   Lab Results  Component Value Date   MICROALBUR <0.2 08/26/2015   LDLCALC 76 10/30/2019   CREATININE 1.13 10/30/2019    Depression screen PHQ 2/9 10/30/2019 10/08/2018 04/11/2018  Decreased Interest 0 0 0  Down, Depressed, Hopeless 0 0 0  PHQ - 2 Score 0 0 0    Fall Risk  10/30/2019 10/08/2018 04/11/2018 01/06/2018 06/24/2017  Falls in the past year? 0 0 No No No  Number falls in past yr: 0 - - - -  Injury with Fall? 0 0 - - -  Follow up Falls evaluation completed - - - -     Allergies  Allergen Reactions  . Tylenol [Acetaminophen] Hypertension    Prior to Admission medications   Medication Sig Start Date End Date Taking? Authorizing Provider  amLODipine (NORVASC) 5 MG tablet Take 1 tablet by mouth once daily 01/09/20  Yes Rutherford Guys, MD  atorvastatin (LIPITOR) 20 MG tablet Take 1 tablet by mouth once daily 01/09/20  Yes Rutherford Guys, MD  Blood Glucose Monitoring Suppl (ONE TOUCH ULTRA 2) w/Device KIT USE AS DIRECTED TO TEST BLOOD SUGAR IN THE MORNING 08/07/16  Yes English, Stephanie D, PA  Blood Glucose Monitoring Suppl KIT 1 application by Does not apply route  every morning. 04/01/16  Yes Ivar Drape D, PA  Blood Pressure Monitoring (BLOOD PRESSURE CUFF) MISC Use as directed 07/07/17  Yes Shawnee Knapp, MD  fluticasone [facility] 50 MCG/ACT nasal spray USE 1 TO 2 SPRAY(S) IN EACH NOSTRIL ONCE DAILY 03/05/20  Yes Rutherford Guys, MD  glucose blood (ONETOUCH VERIO) test strip USE ONE STRIP TO CHECK GLUCOSE TWICE DAILY 04/23/19  Yes Rutherford Guys, MD  hydrochlorothiazide (HYDRODIURIL) 25 MG tablet Take 1 tablet by mouth once daily 01/10/20  Yes Rutherford Guys, MD  hydrocortisone (ANUSOL-HC) 25 MG suppository Place 1 suppository (25 mg total) rectally 2 (two) times daily. 10/30/19  Yes Rutherford Guys, MD  metFORMIN (GLUCOPHAGE-XR) 750 MG 24 hr tablet TAKE 1 TABLET BY MOUTH ONCE DAILY IN THE MORNING WITH BREAKFAST 01/09/20  Yes Rutherford Guys, MD    Past Medical History:  Diagnosis Date  . Diabetes mellitus without complication ([place])   . Fatty liver   . Hepatitis C   . Hypertension   . Tubular adenoma of colon 12/2015    Past Surgical History:  Procedure Laterality Date  . COLONOSCOPY      Social History   Tobacco Use  . Smoking status: Former Research scientist (life sciences)  . Smokeless tobacco: Never Used  Substance Use Topics  . Alcohol  use: Yes    Comment: occasional wine    Family History  Problem Relation Age of Onset  . Heart disease Mother 48       CAD/AMI  . Diabetes Mother   . Hypertension Mother   . Diabetes Maternal Grandfather   . Diabetes Paternal Grandfather   . Diabetes Father   . Diabetes Sister   . Hypertension Sister   . Hypertension Brother   . Diabetes Brother   . Colon cancer Neg Hx   . Esophageal cancer Neg Hx   . Stomach cancer Neg Hx   . Rectal cancer Neg Hx     ROS Per hpi  OBJECTIVE:  Today's Vitals   03/17/20 0922 03/17/20 0928  BP: (!) 158/99 (!) 159/92  [name]: 78   Temp: 98.7 F (37.1 C)   SpO2: 98%   Weight: 229 lb (103.9 kg)   Height: 5' 10"  (1.778 m)    Body mass index is 32.86  kg/m.   Physical Exam   Gen: AA0x3, NAD LLE: 17cm long erythematous, mildly warm, TTP discrete along more distal area, slightly indurated, varicose veins, +2 pedal pulses, pain with plantar flexion        No results found for this or any previous visit (from the past 24 hour(s)).  No results found.   ASSESSMENT and PLAN  1. Pain in left lower leg Due to functional acute aspect, ? Tendon injury? Doxy for concerns of cellulitis. Tramadol bedtime for pain, referring to podiatry for further eval and treatment - Ambulatory referral to Podiatry  Other orders - doxycycline (VIBRA-TABS) 100 MG tablet; Take 1 tablet (100 mg total) by mouth 2 (two) times daily. - traMADol (ULTRAM) 50 MG tablet; Take 1 tablet (50 mg total) by mouth at bedtime as needed for up to 5 days.  Return in about 2 months (around 05/17/2020) for DM visit.    Rutherford Guys, MD Primary Care at [place], [place] [address] Ph.  [phone] Fax [phone]

## 2020-03-31 ENCOUNTER — Telehealth: Payer: Self-pay | Admitting: Family Medicine

## 2020-03-31 NOTE — Telephone Encounter (Signed)
Do you know why this referral is closed. Patient stated he have not heard anything else about this referral. I see in the notes it states patient need a specialist. Can you give me any further details on this?

## 2020-03-31 NOTE — Telephone Encounter (Signed)
Pt came in wondering about pts referral about leg. Pt states it is getting worse. Looks like referral was closed. Please advise.

## 2020-04-05 ENCOUNTER — Other Ambulatory Visit: Payer: Self-pay | Admitting: Family Medicine

## 2020-04-05 NOTE — Telephone Encounter (Signed)
Requested Prescriptions  Pending Prescriptions Disp Refills   amLODipine (NORVASC) 5 MG tablet [Pharmacy Med Name: amLODIPine Besylate 5 MG Oral Tablet] 90 tablet 1    Sig: Take 1 tablet by mouth once daily     Cardiovascular:  Calcium Channel Blockers Failed - 04/05/2020  1:57 PM      Failed - Last BP in normal range    BP Readings from Last 1 Encounters:  03/17/20 (!) 159/92         Passed - Valid encounter within last 6 months    Recent Outpatient Visits          2 weeks ago Pain in left lower leg   [facility] at [facility], Lilia Argue, MD   5 months ago Type 2 diabetes mellitus without complication, without long-term current use of insulin ([place])   [facility] Care at [facility], Lilia Argue, MD   11 months ago Type 2 diabetes mellitus without complication, without long-term current use of insulin ([place])   [facility] Care at [facility], Lilia Argue, MD   1 year ago Type 2 diabetes mellitus without complication, without long-term current use of insulin ([place])   Primary Care at [facility], [place] A, MD   1 year ago Type 2 diabetes mellitus without complication, without long-term current use of insulin ([place])   [facility] Care at [facility], Lilia Argue, MD      Future Appointments            In 3 weeks Rutherford Guys, MD Primary Care at [place], [facility]   In 2 months Wendie Agreste, MD Primary Care at [place], [place]            atorvastatin (LIPITOR) 20 MG tablet [Pharmacy Med Name: Atorvastatin Calcium 20 MG Oral Tablet] 90 tablet 2    Sig: Take 1 tablet by mouth once daily     Cardiovascular:  Antilipid - Statins Failed - 04/05/2020  1:57 PM      Failed - LDL in normal range and within 360 days    LDL Chol Calc (NIH)  Date Value Ref Range Status  10/30/2019 76 0 - 99 mg/dL Final         Passed - Total Cholesterol in normal range and within 360 days    Cholesterol, Total  Date Value Ref Range Status  10/30/2019 145 100 - 199 mg/dL Final         Passed - HDL in normal range  and within 360 days    HDL  Date Value Ref Range Status  10/30/2019 47 >39 mg/dL Final         Passed - Triglycerides in normal range and within 360 days    Triglycerides  Date Value Ref Range Status  10/30/2019 120 0 - 149 mg/dL Final         Passed - Patient is not pregnant      Passed - Valid encounter within last 12 months    Recent Outpatient Visits          2 weeks ago Pain in left lower leg   [facility] at [facility], Lilia Argue, MD   5 months ago Type 2 diabetes mellitus without complication, without long-term current use of insulin [facility])   [facility] at [facility], Lilia Argue, MD   11 months ago Type 2 diabetes mellitus without complication, without long-term current use of insulin [facility])   Primary [facility] at [facility], Lilia Argue, MD   1  year ago Type 2 diabetes mellitus without complication, without long-term current use of insulin ([place])   Primary Care at [facility], [place] A, MD   1 year ago Type 2 diabetes mellitus without complication, without long-term current use of insulin [facility])   Primary Care at [facility], Lilia Argue, MD      Future Appointments            In 3 weeks Rutherford Guys, MD Primary Care at [place], [facility]   In 2 months Wendie Agreste, MD Primary Care at [place], [facility]

## 2020-04-06 ENCOUNTER — Telehealth: Payer: Self-pay | Admitting: Family Medicine

## 2020-04-06 NOTE — Telephone Encounter (Signed)
Pt request refill  traMADol (ULTRAM) 50 MG tablet  Pt states his leg continues to hurt and get worse. He is unable to sleep due to the pain. Pt has not yet got an appt at [facility].  [place] ([facility]), [place] - [place] Phone:  [phone]  Fax:  [phone]

## 2020-04-06 NOTE — Telephone Encounter (Signed)
Pt. Requesting refill for Tramadol.  Noted Rx ended on 03/22/20.  Still awaiting appt. With [facility].  Will route pt. Refill request to PCP office.

## 2020-04-07 MED ORDER — TRAMADOL HCL 50 MG PO TABS: 50.0000 mg | ORAL_TABLET | Freq: Three times a day (TID) | ORAL | 1 refills | Status: AC | PRN

## 2020-04-07 NOTE — Telephone Encounter (Signed)
Msg below  

## 2020-04-07 NOTE — Telephone Encounter (Signed)
Patient is requesting a refill of the following medications: Requested Prescriptions    No prescriptions requested or ordered in this encounter    Date of patient request: 04/06/20 Last office visit: 03/17/20 Date of last refill: 03/17/20 Last refill amount: 15- 0 RF Follow up time period per chart: 04/30/20

## 2020-04-07 NOTE — Addendum Note (Signed)
Addended by: Rutherford Guys on: 04/07/2020 11:04 AM   Modules accepted: Orders

## 2020-04-07 NOTE — Telephone Encounter (Signed)
pmp reviewed  Med refilled 

## 2020-04-11 ENCOUNTER — Encounter (HOSPITAL_COMMUNITY): Payer: Self-pay | Admitting: Emergency Medicine

## 2020-04-11 ENCOUNTER — Other Ambulatory Visit: Payer: Self-pay

## 2020-04-11 ENCOUNTER — Emergency Department (HOSPITAL_COMMUNITY)
Admission: EM | Admit: 2020-04-11 | Discharge: 2020-04-11 | Disposition: A | Discharge status: Home / Self Care | Payer: BC Managed Care – PPO | Attending: Emergency Medicine | Admitting: Emergency Medicine

## 2020-04-11 ENCOUNTER — Emergency Department (HOSPITAL_BASED_OUTPATIENT_CLINIC_OR_DEPARTMENT_OTHER): Payer: BC Managed Care – PPO

## 2020-04-11 DIAGNOSIS — M79605 Pain in left leg: Secondary | ICD-10-CM | POA: Insufficient documentation

## 2020-04-11 DIAGNOSIS — E119 Type 2 diabetes mellitus without complications: Secondary | ICD-10-CM | POA: Insufficient documentation

## 2020-04-11 DIAGNOSIS — I10 Essential (primary) hypertension: Secondary | ICD-10-CM | POA: Diagnosis not present

## 2020-04-11 DIAGNOSIS — Z79899 Other long term (current) drug therapy: Secondary | ICD-10-CM | POA: Diagnosis not present

## 2020-04-11 DIAGNOSIS — Z7984 Long term (current) use of oral hypoglycemic drugs: Secondary | ICD-10-CM | POA: Insufficient documentation

## 2020-04-11 DIAGNOSIS — Z87891 Personal history of nicotine dependence: Secondary | ICD-10-CM | POA: Insufficient documentation

## 2020-04-11 LAB — I-STAT CHEM 8, ED
BUN: 15 mg/dL (ref 6–20)
Calcium, Ion: 1.2 mmol/L (ref 1.15–1.40)
Chloride: 95 mmol/L — ABNORMAL LOW (ref 98–111)
Creatinine, Ser: 1.1 mg/dL (ref 0.61–1.24)
Glucose, Bld: 223 mg/dL — ABNORMAL HIGH (ref 70–99)
HCT: 38 % — ABNORMAL LOW (ref 39.0–52.0)
Hemoglobin: 12.9 g/dL — ABNORMAL LOW (ref 13.0–17.0)
Potassium: 3.8 mmol/L (ref 3.5–5.1)
Sodium: 137 mmol/L (ref 135–145)
TCO2: 30 mmol/L (ref 22–32)

## 2020-04-11 MED ORDER — GABAPENTIN 100 MG PO CAPS: 100.0000 mg | ORAL_CAPSULE | Freq: Three times a day (TID) | ORAL | 0 refills | Status: DC

## 2020-04-11 NOTE — Progress Notes (Signed)
VASCULAR LAB    Left lower extremity venous duplex has been performed.  See CV proc for preliminary results.  Gave report to Domenic Moras, PA-C  Suttyn Cryder, RVT 04/11/2020, 3:38 PM

## 2020-04-11 NOTE — ED Triage Notes (Signed)
Pt sent from [facility] with handwritten Rx for LLE vascular doppler to R/o DVT.  Spoke with Dr. Posey Pronto at [facility] that states she cannot order as outpatient because she doesn't have hospital privileges.    Pt reports LLE pain and swelling x 3 months with cramps.

## 2020-04-11 NOTE — ED Notes (Signed)
Messaged Dr. Jeanell Sparrow regarding pt here for vascular US.  Order received.

## 2020-04-11 NOTE — Discharge Instructions (Signed)
You have been evaluated for your leg pain.  Fortunately no evidence of blood clot in your leg.  Your pain may be due to nerve irritation, neuropathy.  Take gabapentin as prescribed and follow-up closely with your doctor for further care.  Your blood sugar is elevated today and will need to be monitored closely.

## 2020-04-11 NOTE — ED Provider Notes (Signed)
[place] [facility] EMERGENCY DEPARTMENT Provider Note   CSN: [id number] Arrival date & time: 04/11/20  1102     History Chief Complaint  Patient presents with  . Leg Pain    Alejandro [name] is a 59 y.o. male.  The history is provided by the patient and medical records. No language interpreter was used.  Leg Pain    59 year old male significant history of diabetes, hepatitis C, hypertension, sent here from [facility] with request for venous Doppler ultrasound of left lower extremity to rule out DVT.  Patient report for the past 2 to 3 months he has been experiencing intermittent pain to both his legs left greater than right.  Described pain as a sharp shooting tingling burning sensation sometimes in his calf sometimes down his leg that would happen sporadically.  Sometimes stretching his leg and walking seems to improve his symptoms.  Several days ago he was urinating and noted some blood clot and then he was worried for potentially blood clot in his leg prompting this visit.  He was initially evaluated by his PCP who sent him here for DVT study.  Patient denies ever have DVT in the past.  He denies any recent injury.  No chest pain or shortness of breath no fever chills no back pain no bowel bladder incontinence or saddle anesthesia.  He report pain is mild at this time.  Past Medical History:  Diagnosis Date  . Diabetes mellitus without complication ([place])   . Fatty liver   . Hepatitis C   . Hypertension   . Tubular adenoma of colon 12/2015    Patient Active Problem List   Diagnosis Date Noted  . Essential hypertension 03/11/2017  . Diabetes ([place]) 09/14/2014  . Gallstones 09/14/2014  . Chronic hepatitis C virus infection ([place]) 12/01/2008  . ANEMIA, IRON DEFICIENCY 12/01/2008  . BLOOD IN STOOL 12/01/2008    Past Surgical History:  Procedure Laterality Date  . COLONOSCOPY         Family History  Problem Relation Age of Onset  . Heart disease  Mother 79       CAD/AMI  . Diabetes Mother   . Hypertension Mother   . Diabetes Maternal Grandfather   . Diabetes Paternal Grandfather   . Diabetes Father   . Diabetes Sister   . Hypertension Sister   . Hypertension Brother   . Diabetes Brother   . Colon cancer Neg Hx   . Esophageal cancer Neg Hx   . Stomach cancer Neg Hx   . Rectal cancer Neg Hx     Social History   Tobacco Use  . Smoking status: Former Research scientist (life sciences)  . Smokeless tobacco: Never Used  Substance Use Topics  . Alcohol use: Yes    Comment: occasional wine  . Drug use: No    Home Medications Prior to Admission medications   Medication Sig Start Date End Date Taking? Authorizing Provider  amLODipine (NORVASC) 5 MG tablet Take 1 tablet by mouth once daily 04/05/20   Rutherford Guys, MD  atorvastatin (LIPITOR) 20 MG tablet Take 1 tablet by mouth once daily 04/05/20   Rutherford Guys, MD  Blood Glucose Monitoring Suppl (ONE TOUCH ULTRA 2) w/Device KIT USE AS DIRECTED TO TEST BLOOD SUGAR IN THE MORNING 08/07/16   Ivar Drape D, PA  Blood Glucose Monitoring Suppl KIT 1 application by Does not apply route every morning. 04/01/16   Ivar Drape D, PA  Blood Pressure Monitoring (BLOOD PRESSURE CUFF)  MISC Use as directed 07/07/17   Shawnee Knapp, MD  doxycycline (VIBRA-TABS) 100 MG tablet Take 1 tablet (100 mg total) by mouth 2 (two) times daily. 03/17/20   Rutherford Guys, MD  fluticasone (FLONASE) 50 MCG/ACT nasal spray USE 1 TO 2 SPRAY(S) IN EACH NOSTRIL ONCE DAILY 03/05/20   Rutherford Guys, MD  glucose blood [facility] VERIO) test strip USE ONE STRIP TO CHECK GLUCOSE TWICE DAILY 04/23/19   Rutherford Guys, MD  hydrochlorothiazide (HYDRODIURIL) 25 MG tablet Take 1 tablet by mouth once daily 01/10/20   Rutherford Guys, MD  hydrocortisone (ANUSOL-HC) 25 MG suppository Place 1 suppository (25 mg total) rectally 2 (two) times daily. 10/30/19   Rutherford Guys, MD  metFORMIN (GLUCOPHAGE-XR) 750 MG 24 hr tablet TAKE 1 TABLET  BY MOUTH ONCE DAILY IN THE MORNING WITH BREAKFAST 01/09/20   Rutherford Guys, MD  traMADol (ULTRAM) 50 MG tablet Take 1 tablet (50 mg total) by mouth every 8 (eight) hours as needed for up to 5 days. 04/07/20 04/12/20  Rutherford Guys, MD    Allergies    Tylenol [acetaminophen]  Review of Systems   Review of Systems  All other systems reviewed and are negative.   Physical Exam Updated Vital Signs BP (!) 155/92 (BP Location: Left Arm)   Pulse 86   Temp 98.5 F (36.9 C) (Oral)   Resp 16   Ht _0  (1.778 m)   Wt 104.3 kg   SpO2 100%   BMI 33.00 kg/m   Physical Exam Vitals and nursing note reviewed.  Constitutional:      General: He is not in acute distress.    Appearance: He is well-developed.  HENT:     Head: Atraumatic.  Eyes:     Conjunctiva/sclera: Conjunctivae normal.  Cardiovascular:     Rate and Rhythm: Normal rate and regular rhythm.     Pulses: Normal pulses.     Heart sounds: Normal heart sounds.  Pulmonary:     Effort: Pulmonary effort is normal.     Breath sounds: Normal breath sounds.  Abdominal:     Palpations: Abdomen is soft.     Tenderness: There is no abdominal tenderness.  Musculoskeletal:     Cervical back: Neck supple.     Comments: Left lower extremity: Hyperpigmented skin changes noted to left lower leg with mild tenderness to palpation about the calf and the posterior leg but no surrounding erythema edema or warmth.  Dorsalis pedis pulse palpable with brisk cap refill.  Skin:    Findings: No rash.  Neurological:     Mental Status: He is alert and oriented to person, place, and time.  Psychiatric:        Mood and Affect: Mood normal.     ED Results / Procedures / Treatments   Labs (all labs ordered are listed, but only abnormal results are displayed) Labs Reviewed  I-STAT CHEM 8, ED - Abnormal; Notable for the following components:      Result Value   Chloride 95 (*)    Glucose, Bld 223 (*)    Hemoglobin 12.9 (*)    HCT 38.0 (*)      All other components within normal limits    EKG None  Radiology No results found.  Procedures Procedures (including critical care time)  Medications Ordered in ED Medications - No data to display  ED Course  I have reviewed the triage vital signs and the nursing notes.  Pertinent labs &  imaging results that were available during my care of the patient were reviewed by me and considered in my medical decision making (see chart for details).    MDM Rules/Calculators/A&P                          BP (!) 155/92 (BP Location: Left Arm)   Pulse 86   Temp 98.5 F (36.9 C) (Oral)   Resp 16   Ht _0  (1.778 m)   Wt 104.3 kg   SpO2 100%   BMI 33.00 kg/m   Final Clinical Impression(s) / ED Diagnoses Final diagnoses:  Left leg pain    Rx / DC Orders ED Discharge Orders         Ordered    gabapentin (NEURONTIN) 100 MG capsule  3 times daily        04/11/20 1315         1:05 PM Patient with history of diabetes presenting with complaints of recurrent sharp shooting aches and pains to both extremity left greater than right.  PCP initially evaluated patient and sent him here for a DVT study.  DVT study was obtained of the left lower extremity fortunately did not demonstrate any deep venous thrombosis.  I do not appreciate any evidence of cellulitis.  No injury to suggest fracture or dislocation.  I suspect some of his symptoms may be due to neuropathy.  His CBG today is elevated at 223.  Other electrolytes are reassuring.  Will prescribe Neurontin to use as needed and patient will follow up with PCP for further care.  Return precaution discussed.   Domenic Moras, PA-C 04/11/20 1317    Pattricia Boss, MD 04/11/20 2103

## 2020-04-15 ENCOUNTER — Telehealth: Payer: Self-pay

## 2020-04-15 NOTE — Telephone Encounter (Signed)
Pt called to f/u on his referral sent over from his PCP. I have let him know we are working through those and will be calling him to schedule his appt soon.

## 2020-04-28 ENCOUNTER — Other Ambulatory Visit: Payer: Self-pay | Admitting: *Deleted

## 2020-04-28 DIAGNOSIS — I739 Peripheral vascular disease, unspecified: Secondary | ICD-10-CM

## 2020-04-30 ENCOUNTER — Encounter: Payer: Self-pay | Admitting: Family Medicine

## 2020-04-30 ENCOUNTER — Other Ambulatory Visit: Payer: Self-pay

## 2020-04-30 ENCOUNTER — Ambulatory Visit: Payer: BC Managed Care – PPO | Admitting: Family Medicine

## 2020-04-30 VITALS — BP 143/88 | HR 89 | Temp 98.3°F | Resp 15 | Ht 70.0 in | Wt 226.0 lb

## 2020-04-30 DIAGNOSIS — M79662 Pain in left lower leg: Secondary | ICD-10-CM

## 2020-04-30 DIAGNOSIS — I10 Essential (primary) hypertension: Secondary | ICD-10-CM | POA: Diagnosis not present

## 2020-04-30 DIAGNOSIS — E782 Mixed hyperlipidemia: Secondary | ICD-10-CM | POA: Diagnosis not present

## 2020-04-30 DIAGNOSIS — E1165 Type 2 diabetes mellitus with hyperglycemia: Secondary | ICD-10-CM

## 2020-04-30 DIAGNOSIS — K644 Residual hemorrhoidal skin tags: Secondary | ICD-10-CM

## 2020-04-30 MED ORDER — HYDROCHLOROTHIAZIDE 25 MG PO TABS: 25.0000 mg | ORAL_TABLET | Freq: Every day | ORAL | 1 refills | Status: DC

## 2020-04-30 MED ORDER — BLOOD GLUCOSE MONITORING SUPPL KIT
1.0000 | PACK | 3 refills | Status: AC
Start: 2020-04-30 — End: ?

## 2020-04-30 MED ORDER — METFORMIN HCL ER 750 MG PO TB24: ORAL_TABLET | ORAL | 0 refills | Status: DC

## 2020-04-30 MED ORDER — GABAPENTIN 300 MG PO CAPS
300.0000 mg | ORAL_CAPSULE | Freq: Three times a day (TID) | ORAL | 3 refills | Status: DC
Start: 2020-04-30 — End: 2020-05-19

## 2020-04-30 NOTE — Progress Notes (Signed)
10/7/20213:43 PM  Alejandro [name] [birth year], 59 y.o., male [id number]  Chief Complaint  Patient presents with   Leg Swelling    Lt lower leg is worse noted by pt noted he was also in ED    Diabetes    pt has been managing diet and seems well in control of diet    Referral    pt would like a referral for GI as his hemrrhoids are worse and would like to be checked for polyps     HPI:   Patient is a 59 y.o. male with past medical history significant for DM2, HTN, HLP, HCV s/p treatment who presents today for routine followup   Last OV aug 2021 -  Acute pain/skin changes LLE, Referred to podiatry, tx doxy for possible cellulitis, tramadol given for pain  Seen in ER sept 18th 2021 - DVT r/o with doppler, started on gabapentin, sees vasc surg later this month  gabapetin has been providing partial pain relief  Hemorrhoids got inflamed and bleeding after eating tons of okra with ghost peppers while in [place] - they have resolved now colonoscopy due in 2022  Otherwise he takes his meds as prescribed Has been limited in walking due to leg pain He checks fasting cbgs, at goal Working on diet   Depression screen [facility] 2/9 04/30/2020 10/30/2019 10/08/2018  Decreased Interest 0 0 0  Down, Depressed, Hopeless 0 0 0  PHQ - 2 Score 0 0 0    Fall Risk  04/30/2020 10/30/2019 10/08/2018 04/11/2018 01/06/2018  Falls in the past year? 0 0 0 No No  Number falls in past yr: 0 0 - - -  Injury with Fall? 0 0 0 - -  Risk for fall due to : No Fall Risks - - - -  Follow up Falls evaluation completed Falls evaluation completed - - -     Allergies  Allergen Reactions   Tylenol [Acetaminophen] Hypertension    Prior to Admission medications   Medication Sig Start Date End Date Taking? Authorizing Provider  amLODipine (NORVASC) 5 MG tablet Take 1 tablet by mouth once daily 04/05/20  Yes Rutherford Guys, MD  atorvastatin (LIPITOR) 20 MG tablet Take 1 tablet by mouth once daily 04/05/20  Yes  Rutherford Guys, MD  Blood Glucose Monitoring Suppl (ONE TOUCH ULTRA 2) w/Device KIT USE AS DIRECTED TO TEST BLOOD SUGAR IN THE MORNING 08/07/16  Yes English, Stephanie D, PA  Blood Glucose Monitoring Suppl KIT 1 application by Does not apply route every morning. 04/01/16  Yes Ivar Drape D, PA  Blood Pressure Monitoring (BLOOD PRESSURE CUFF) MISC Use as directed 07/07/17  Yes Shawnee Knapp, MD  doxycycline (VIBRA-TABS) 100 MG tablet Take 1 tablet (100 mg total) by mouth 2 (two) times daily. 03/17/20  Yes Rutherford Guys, MD  fluticasone (FLONASE) 50 MCG/ACT nasal spray USE 1 TO 2 SPRAY(S) IN EACH NOSTRIL ONCE DAILY 03/05/20  Yes Rutherford Guys, MD  gabapentin (NEURONTIN) 100 MG capsule Take 1 capsule (100 mg total) by mouth 3 (three) times daily. 04/11/20  Yes Domenic Moras, PA-C  glucose blood (ONETOUCH VERIO) test strip USE ONE STRIP TO CHECK GLUCOSE TWICE DAILY 04/23/19  Yes Rutherford Guys, MD  hydrochlorothiazide (HYDRODIURIL) 25 MG tablet Take 1 tablet by mouth once daily 01/10/20  Yes Rutherford Guys, MD  hydrocortisone (ANUSOL-HC) 25 MG suppository Place 1 suppository (25 mg total) rectally 2 (two) times daily. 10/30/19  Yes Rutherford Guys, MD  metFORMIN (GLUCOPHAGE-XR) 750 MG 24 hr tablet TAKE 1 TABLET BY MOUTH ONCE DAILY IN THE MORNING WITH BREAKFAST 01/09/20  Yes Rutherford Guys, MD    Past Medical History:  Diagnosis Date   Diabetes mellitus without complication ([place])    Fatty liver    Hepatitis C    Hypertension    Tubular adenoma of colon 12/2015    Past Surgical History:  Procedure Laterality Date   COLONOSCOPY      Social History   Tobacco Use   Smoking status: Former Smoker   Smokeless tobacco: Never Used  Substance Use Topics   Alcohol use: Yes    Comment: occasional wine    Family History  Problem Relation Age of Onset   Heart disease Mother 82       CAD/AMI   Diabetes Mother    Hypertension Mother    Diabetes Maternal Grandfather     Diabetes Paternal Grandfather    Diabetes Father    Diabetes Sister    Hypertension Sister    Hypertension Brother    Diabetes Brother    Colon cancer Neg Hx    Esophageal cancer Neg Hx    Stomach cancer Neg Hx    Rectal cancer Neg Hx     Review of Systems  Constitutional: Negative for chills and fever.  Respiratory: Negative for cough and shortness of breath.   Cardiovascular: Negative for chest pain, palpitations and leg swelling.  Gastrointestinal: Negative for abdominal pain, nausea and vomiting.     OBJECTIVE:  Today's Vitals   04/30/20 1528  BP: (!) 143/88  [name]: 89  Resp: 15  Temp: 98.3 F (36.8 C)  TempSrc: Temporal  SpO2: 100%  Weight: 226 lb (102.5 kg)  Height: 5' 10"  (1.778 m)   Body mass index is 32.43 kg/m.  BP Readings from Last 3 Encounters:  04/30/20 (!) 143/88  04/11/20 134/85  03/17/20 (!) 159/92    Physical Exam Vitals and nursing note reviewed.  Constitutional:      Appearance: He is well-developed.  HENT:     Head: Normocephalic and atraumatic.  Eyes:     Conjunctiva/sclera: Conjunctivae normal.     Pupils: Pupils are equal, round, and reactive to light.  Cardiovascular:     Rate and Rhythm: Normal rate and regular rhythm.     Heart sounds: No murmur heard.  No friction rub. No gallop.   Pulmonary:     Effort: Pulmonary effort is normal.     Breath sounds: Normal breath sounds. No wheezing, rhonchi or rales.  Musculoskeletal:     Cervical back: Neck supple.  Skin:    General: Skin is warm and dry.  Neurological:     Mental Status: He is alert and oriented to person, place, and time.     No results found for this or any previous visit (from the past 24 hour(s)).  No results found.   ASSESSMENT and PLAN  1. Type 2 diabetes mellitus with hyperglycemia, without long-term current use of insulin (HCC) Controlled. Continue current regime.  - Hemoglobin A1c - metFORMIN (GLUCOPHAGE-XR) 750 MG 24 hr tablet; TAKE 1  TABLET BY MOUTH ONCE DAILY IN THE MORNING WITH BREAKFAST - Microalbumin/Creatinine Ratio, Urine; Future - Blood Glucose Monitoring Suppl KIT; 1 application by Does not apply route every morning.  2. Essential hypertension Controlled. Continue current regime.  - Comprehensive metabolic panel - hydrochlorothiazide (HYDRODIURIL) 25 MG tablet; Take 1 tablet (25 mg total) by mouth daily.  3. Mixed hyperlipidemia Checking  labs today, medications will be adjusted as needed.  - Lipid panel  4. Pain in left lower leg Has upcoming exam with vascular surgeon. Increasing gabapentin to 336m TID, reviewed r/se/b  5. External hemorrhoids Resolved, patient declines referral at this time  Other orders - gabapentin (NEURONTIN) 300 MG capsule; Take 1 capsule (300 mg total) by mouth 3 (three) times daily.  Return in about 3 months (around 07/31/2020).    IRutherford Guys MD [facility] [place] [id number].  [phone] [phone]

## 2020-04-30 NOTE — Patient Instructions (Addendum)
   I think that changes in your legs are due to lipodermatosclerosis developing  If you have lab work done today you will be contacted with your lab results within the next 2 weeks.  If you have not heard from [place] then please contact us. The fastest way to get your results is to register for My Chart.   IF you received an x-ray today, you will receive an invoice from [facility]. Please contact [facility] at [phone] with questions or concerns regarding your invoice.   IF you received labwork today, you will receive an invoice from [facility]. Please contact [facility] at [phone] with questions or concerns regarding your invoice.   Our billing staff will not be able to assist you with questions regarding bills from these companies.  You will be contacted with the lab results as soon as they are available. The fastest way to get your results is to activate your My Chart account. Instructions are located on the last page of this paperwork. If you have not heard from [place] regarding the results in 2 weeks, please contact this office.

## 2020-05-01 LAB — LIPID PANEL
Chol/HDL Ratio: 2.6 ratio (ref 0.0–5.0)
Cholesterol, Total: 127 mg/dL (ref 100–199)
HDL: 49 mg/dL (ref >39)
LDL Chol Calc (NIH): 55 mg/dL (ref 0–99)
Triglycerides: 134 mg/dL (ref 0–149)
VLDL Cholesterol Cal: 23 mg/dL (ref 5–40)

## 2020-05-01 LAB — COMPREHENSIVE METABOLIC PANEL
ALT: 12 IU/L (ref 0–44)
AST: 19 IU/L (ref 0–40)
Albumin/Globulin Ratio: 1.7 (ref 1.2–2.2)
Albumin: 4.5 g/dL (ref 3.8–4.9)
Alkaline Phosphatase: 77 IU/L (ref 44–121)
BUN/Creatinine Ratio: 11 (ref 9–20)
BUN: 13 mg/dL (ref 6–24)
Bilirubin Total: <0.2 mg/dL (ref 0.0–1.2)
CO2: 28 mmol/L (ref 20–29)
Calcium: 9.8 mg/dL (ref 8.7–10.2)
Chloride: 99 mmol/L (ref 96–106)
Creatinine, Ser: 1.17 mg/dL (ref 0.76–1.27)
GFR calc Af Amer: 78 mL/min/{1.73_m2} (ref >59)
GFR calc non Af Amer: 68 mL/min/{1.73_m2} (ref >59)
Globulin, Total: 2.7 g/dL (ref 1.5–4.5)
Glucose: 142 mg/dL — ABNORMAL HIGH (ref 65–99)
Potassium: 4 mmol/L (ref 3.5–5.2)
Sodium: 138 mmol/L (ref 134–144)
Total Protein: 7.2 g/dL (ref 6.0–8.5)

## 2020-05-01 LAB — HEMOGLOBIN A1C
Est. average glucose Bld gHb Est-mCnc: 154 mg/dL
Hgb A1c MFr Bld: 7 % — ABNORMAL HIGH (ref 4.8–5.6)

## 2020-05-04 ENCOUNTER — Telehealth: Payer: Self-pay | Admitting: *Deleted

## 2020-05-04 NOTE — Telephone Encounter (Signed)
Pt called to request earlier appt. Complains of bilateral rest pain and new small wound on foot. Rescheduled for 10/11.

## 2020-05-06 ENCOUNTER — Ambulatory Visit (HOSPITAL_COMMUNITY)
Admission: RE | Admit: 2020-05-06 | Discharge: 2020-05-06 | Disposition: A | Discharge status: Home / Self Care | Payer: BC Managed Care – PPO | Source: Ambulatory Visit | Attending: Vascular Surgery | Admitting: Vascular Surgery

## 2020-05-06 ENCOUNTER — Ambulatory Visit (INDEPENDENT_AMBULATORY_CARE_PROVIDER_SITE_OTHER): Payer: BC Managed Care – PPO | Admitting: Vascular Surgery

## 2020-05-06 ENCOUNTER — Encounter: Payer: Self-pay | Admitting: Vascular Surgery

## 2020-05-06 ENCOUNTER — Other Ambulatory Visit: Payer: Self-pay

## 2020-05-06 VITALS — BP 149/88 | HR 84 | Temp 98.4°F | Resp 20 | Ht 70.0 in | Wt 225.0 lb

## 2020-05-06 DIAGNOSIS — I739 Peripheral vascular disease, unspecified: Secondary | ICD-10-CM

## 2020-05-06 DIAGNOSIS — I872 Venous insufficiency (chronic) (peripheral): Secondary | ICD-10-CM

## 2020-05-06 DIAGNOSIS — M7989 Other specified soft tissue disorders: Secondary | ICD-10-CM

## 2020-05-06 NOTE — Progress Notes (Signed)
REASON FOR CONSULT:    Peripheral vascular disease.  The consult is requested by Dr. Grant Fontana.   ASSESSMENT & PLAN:   CHRONIC VENOUS INSUFFICIENCY: This patient has CEAP C4a venous disease.  We have discussed the importance of intermittent leg elevation and the proper positioning for this.  In addition we have fitted him in some compression stockings with a gradient of 20 to 30 mmHg.  I have encouraged him to avoid prolonged sitting and standing.  We discussed the importance of exercise specifically walking and water aerobics.  We also discussed the importance of maintaining a healthy weight as central obesity especially increases lower extremity venous pressure.  As the winter is approaching I encouraged him to keep his skin well lubricated.  I would like to see him back in 3 months and we will get formal venous reflux testing at that time.  I did look at his superficial system myself in the great saphenous vein is not especially dilated.  Thus I suspect he has mostly deep venous reflux.  He knows to call sooner if he has problems.  I did reassure him that he has no evidence of arterial insufficiency.  Deitra Mayo, MD Office: [phone]   HPI:   Alejandro [name] is a pleasant 59 y.o. male, was referred for evaluation of peripheral vascular disease.  I have reviewed the records from the referring office.  The patient was seen at [facility].  The patient was complaining of leg pain at that time.  He was unaware of any injury to the left lower leg.  Given his history of diabetes and hypertension he was felt potentially have peripheral vascular disease.  On my history the patient describes aching pain and heaviness in the left leg which is aggravated by standing and sitting relieved somewhat with elevation.  He denies any previous history of DVT.  Has had no previous venous procedures.  He works on his feet all day for long hours.  I do not get any history of claudication, rest  pain, or nonhealing ulcers.  He does admit to some issues with swelling in the left leg.  He did have a venous duplex scan on 04/12/2020 that showed no evidence of DVT in the left lower extremity.  His risk factors for peripheral vascular disease include diabetes, hypertension, hypercholesterolemia.  He has a remote history of tobacco use.  He denies any family history of premature cardiovascular disease.  Past Medical History:  Diagnosis Date  . Diabetes mellitus without complication ([place])   . Fatty liver   . Hepatitis C   . Hypertension   . Tubular adenoma of colon 12/2015    Family History  Problem Relation Age of Onset  . Heart disease Mother 83       CAD/AMI  . Diabetes Mother   . Hypertension Mother   . Diabetes Maternal Grandfather   . Diabetes Paternal Grandfather   . Diabetes Father   . Diabetes Sister   . Hypertension Sister   . Hypertension Brother   . Diabetes Brother   . Colon cancer Neg Hx   . Esophageal cancer Neg Hx   . Stomach cancer Neg Hx   . Rectal cancer Neg Hx     SOCIAL HISTORY: Social History   Socioeconomic History  . Marital status: Married    Spouse name: Not on file  . Number of children: Not on file  . Years of education: Not on file  . Highest education level: Not  on file  Occupational History  . Not on file  Tobacco Use  . Smoking status: Former Research scientist (life sciences)  . Smokeless tobacco: Never Used  Vaping Use  . Vaping Use: Never used  Substance and Sexual Activity  . Alcohol use: Yes    Comment: occasional wine  . Drug use: No  . Sexual activity: Yes  Other Topics Concern  . Not on file  Social History Narrative   Marital status: married x 24 years      Children: 1 child; no grandchildren      Lives: with wife; daughter in [place]      Employment: Estate agent x 14 years; [employer]      Tobacco: quit smoking after ten years      Alcohol: 3 glasses of wine weekly      Exercise:  none            Social Determinants of Health   Financial Resource Strain:   . Difficulty of Paying Living Expenses: Not on file  Food Insecurity:   . Worried About Charity fundraiser in the Last Year: Not on file  . Ran Out of Food in the Last Year: Not on file  Transportation Needs:   . Lack of Transportation (Medical): Not on file  . Lack of Transportation (Non-Medical): Not on file  Physical Activity:   . Days of Exercise per Week: Not on file  . Minutes of Exercise per Session: Not on file  Stress:   . Feeling of Stress : Not on file  Social Connections:   . Frequency of Communication with Friends and Family: Not on file  . Frequency of Social Gatherings with Friends and Family: Not on file  . Attends Religious Services: Not on file  . Active Member of Clubs or Organizations: Not on file  . Attends Archivist Meetings: Not on file  . Marital Status: Not on file  Intimate Partner Violence:   . Fear of Current or Ex-Partner: Not on file  . Emotionally Abused: Not on file  . Physically Abused: Not on file  . Sexually Abused: Not on file    Allergies  Allergen Reactions  . Tylenol [Acetaminophen] Hypertension    Current Outpatient Medications  Medication Sig Dispense Refill  . amLODipine (NORVASC) 5 MG tablet Take 1 tablet by mouth once daily 90 tablet 1  . atorvastatin (LIPITOR) 20 MG tablet Take 1 tablet by mouth once daily 90 tablet 2  . Blood Glucose Monitoring Suppl (ONE TOUCH ULTRA 2) w/Device KIT USE AS DIRECTED TO TEST BLOOD SUGAR IN THE MORNING 1 each 0  . Blood Glucose Monitoring Suppl KIT 1 application by Does not apply route every morning. 1 kit 3  . Blood Pressure Monitoring (BLOOD PRESSURE CUFF) MISC Use as directed 1 each 0  . fluticasone (FLONASE) 50 MCG/ACT nasal spray USE 1 TO 2 SPRAY(S) IN EACH NOSTRIL ONCE DAILY 16 g 0  . gabapentin (NEURONTIN) 300 MG capsule Take 1 capsule (300 mg total) by mouth 3 (three) times daily. 90 capsule 3  .  glucose blood (ONETOUCH VERIO) test strip USE ONE STRIP TO CHECK GLUCOSE TWICE DAILY 150 each 6  . hydrochlorothiazide (HYDRODIURIL) 25 MG tablet Take 1 tablet (25 mg total) by mouth daily. 90 tablet 1  . hydrocortisone (ANUSOL-HC) 25 MG suppository Place 1 suppository (25 mg total) rectally 2 (two) times daily. 12 suppository 1  . metFORMIN (GLUCOPHAGE-XR) 750 MG  24 hr tablet TAKE 1 TABLET BY MOUTH ONCE DAILY IN THE MORNING WITH BREAKFAST 90 tablet 0   No current facility-administered medications for this visit.    REVIEW OF SYSTEMS:  _0  denotes positive finding, _1  denotes negative finding Cardiac  Comments:  Chest pain or chest pressure:    Shortness of breath upon exertion:    Short of breath when lying flat:    Irregular heart rhythm:        Vascular    Pain in calf, thigh, or hip brought on by ambulation: x   Pain in feet at night that wakes you up from your sleep:  x   Blood clot in your veins:    Leg swelling:  x       Pulmonary    Oxygen at home:    Productive cough:     Wheezing:         Neurologic    Sudden weakness in arms or legs:     Sudden numbness in arms or legs:     Sudden onset of difficulty speaking or slurred speech:    Temporary loss of vision in one eye:     Problems with dizziness:         Gastrointestinal    Blood in stool:     Vomited blood:         Genitourinary    Burning when urinating:     Blood in urine:        Psychiatric    Major depression:         Hematologic    Bleeding problems:    Problems with blood clotting too easily:        Skin    Rashes or ulcers:        Constitutional    Fever or chills:     PHYSICAL EXAM:   Vitals:   05/06/20 1236  BP: (!) 149/88  Pulse: 84  Resp: 20  Temp: 98.4 F (36.9 C)  SpO2: 98%  Weight: 225 lb (102.1 kg)  Height: _2  (1.778 m)   GENERAL: The patient is a well-nourished male, in no acute distress. The vital signs are documented above. CARDIAC: There is a regular rate and  rhythm.  VASCULAR: I do not detect carotid bruits. He has palpable femoral and pedal pulses bilaterally. He has hyperpigmentation bilateral especially on the left. He has moderate left lower extremity swelling. I did look at his left great saphenous vein myself with the SonoSite and in the thigh this was not dilated.        PULMONARY: There is good air exchange bilaterally without wheezing or rales. ABDOMEN: Soft and non-tender with normal pitched bowel sounds.  MUSCULOSKELETAL: There are no major deformities or cyanosis. NEUROLOGIC: No focal weakness or paresthesias are detected. SKIN: There are no ulcers or rashes noted. PSYCHIATRIC: The patient has a normal affect.  DATA:    ARTERIAL DOPPLER STUDY: I have independently interpreted his arterial Doppler study today.  On the right side there is a triphasic dorsalis pedis and posterior tibial signal.  ABI is 100%.  Toe pressures 130 mmHg.  On the left side there is a triphasic dorsalis pedis and posterior tibial signal.  ABIs 100%.  Toe pressures 111 mmHg.

## 2020-05-07 ENCOUNTER — Other Ambulatory Visit: Payer: Self-pay | Admitting: *Deleted

## 2020-05-07 DIAGNOSIS — I872 Venous insufficiency (chronic) (peripheral): Secondary | ICD-10-CM

## 2020-05-19 ENCOUNTER — Ambulatory Visit (INDEPENDENT_AMBULATORY_CARE_PROVIDER_SITE_OTHER): Payer: BC Managed Care – PPO | Admitting: Family Medicine

## 2020-05-19 ENCOUNTER — Encounter: Payer: Self-pay | Admitting: Family Medicine

## 2020-05-19 ENCOUNTER — Other Ambulatory Visit: Payer: Self-pay

## 2020-05-19 VITALS — BP 133/81 | HR 92 | Temp 98.7°F | Ht 70.0 in | Wt 228.0 lb

## 2020-05-19 DIAGNOSIS — I872 Venous insufficiency (chronic) (peripheral): Secondary | ICD-10-CM

## 2020-05-19 DIAGNOSIS — E782 Mixed hyperlipidemia: Secondary | ICD-10-CM

## 2020-05-19 DIAGNOSIS — E1165 Type 2 diabetes mellitus with hyperglycemia: Secondary | ICD-10-CM

## 2020-05-19 DIAGNOSIS — I10 Essential (primary) hypertension: Secondary | ICD-10-CM

## 2020-05-19 MED ORDER — GABAPENTIN 400 MG PO CAPS: 400.0000 mg | ORAL_CAPSULE | Freq: Three times a day (TID) | ORAL | 3 refills | Status: DC

## 2020-05-19 MED ORDER — ZIKS ARTHRITIS PAIN RELIEF 0.025-1-12 % EX CREA: 1.0000 "application " | TOPICAL_CREAM | Freq: Three times a day (TID) | CUTANEOUS | 3 refills | Status: DC | PRN

## 2020-05-19 MED ORDER — LIDOCAINE 4 % EX PTCH: 1.0000 | MEDICATED_PATCH | Freq: Every day | CUTANEOUS | 3 refills | Status: DC

## 2020-05-19 NOTE — Patient Instructions (Addendum)
Use Capsaicin cream during the day as needed Use lidocaine patches at night Taking gabapentin 400mg  three times a day Wear compression socks Preventative care: keep moisturized as able  Chronic Venous Insufficiency Chronic venous insufficiency is a condition where the leg veins cannot effectively pump blood from the legs to the heart. This happens when the vein walls are either stretched, weakened, or damaged, or when the valves inside the vein are damaged. With the right treatment, you should be able to continue with an active life. This condition is also called venous stasis. What are the causes? Common causes of this condition include:  High blood pressure inside the veins (venous hypertension).  Sitting or standing too long, causing increased blood pressure in the leg veins.  A blood clot that blocks blood flow in a vein (deep vein thrombosis, DVT).  Inflammation of a vein (phlebitis) that causes a blood clot to form.  Tumors in the pelvis that cause blood to back up. What increases the risk? The following factors may make you more likely to develop this condition:  Having a family history of this condition.  Obesity.  Pregnancy.  Living without enough regular physical activity or exercise (sedentary lifestyle).  Smoking.  Having a job that requires long periods of standing or sitting in one place.  Being a certain age. Women in their 91s and 49s and men in their 45s are more likely to develop this condition. What are the signs or symptoms? Symptoms of this condition include:  Veins that are enlarged, bulging, or twisted (varicose veins).  Skin breakdown or ulcers.  Reddened skin or dark discoloration of skin on the leg between the knee and ankle.  Brown, smooth, tight, and painful skin just above the ankle, usually on the inside of the leg (lipodermatosclerosis).  Swelling of the legs. How is this diagnosed? This condition may be diagnosed based on:  Your  medical history.  A physical exam.  Tests, such as: ? A procedure that creates an image of a blood vessel and nearby organs and provides information about blood flow through the blood vessel (duplex ultrasound). ? A procedure that tests blood flow (plethysmography). ? A procedure that looks at the veins using X-ray and dye (venogram). How is this treated? The goals of treatment are to help you return to an active life and to minimize pain or disability. Treatment depends on the severity of your condition, and it may include:  Wearing compression stockings. These can help relieve symptoms and help prevent your condition from getting worse. However, they do not cure the condition.  Sclerotherapy. This procedure involves an injection of a solution that shrinks damaged veins.  Surgery. This may involve: ? Removing a diseased vein (vein stripping). ? Cutting off blood flow through the vein (laser ablation surgery). ? Repairing or reconstructing a valve within the affected vein. Follow these instructions at home:      Wear compression stockings as told by your health care provider. These stockings help to prevent blood clots and reduce swelling in your legs.  Take over-the-counter and prescription medicines only as told by your health care provider.  Stay active by exercising, walking, or doing different activities. Ask your health care provider what activities are safe for you and how much exercise you need.  Drink enough fluid to keep your urine pale yellow.  Do not use any products that contain nicotine or tobacco, such as cigarettes, e-cigarettes, and chewing tobacco. If you need help quitting, ask your health care  provider.  Keep all follow-up visits as told by your health care provider. This is important. Contact a health care provider if you:  Have redness, swelling, or more pain in the affected area.  See a red streak or line that goes up or down from the affected  area.  Have skin breakdown or skin loss in the affected area, even if the breakdown is small.  Get an injury in the affected area. Get help right away if:  You get an injury and an open wound in the affected area.  You have: ? Severe pain that does not get better with medicine. ? Sudden numbness or weakness in the foot or ankle below the affected area. ? Trouble moving your foot or ankle. ? A fever. ? Worse or persistent symptoms. ? Chest pain. ? Shortness of breath. Summary  Chronic venous insufficiency is a condition where the leg veins cannot effectively pump blood from the legs to the heart.  Chronic venous insufficiency occurs when the vein walls become stretched, weakened, or damaged, or when valves within the vein are damaged.  Treatment depends on how severe your condition is. It often involves wearing compression stockings and may involve having a procedure.  Make sure you stay active by exercising, walking, or doing different activities. Ask your health care provider what activities are safe for you and how much exercise you need. This information is not intended to replace advice given to you by your health care provider. Make sure you discuss any questions you have with your health care provider. Document Revised: 04/03/2018 Document Reviewed: 04/03/2018 Elsevier Patient Education  [employer].  If you have lab work done today you will be contacted with your lab results within the next 2 weeks.  If you have not heard from [place] then please contact us. The fastest way to get your results is to register for My Chart.   IF you received an x-ray today, you will receive an invoice from [facility]. Please contact [facility] at [phone] with questions or concerns regarding your invoice.   IF you received labwork today, you will receive an invoice from [facility]. Please contact [facility] at [phone] with questions or concerns regarding your  invoice.   Our billing staff will not be able to assist you with questions regarding bills from these companies.  You will be contacted with the lab results as soon as they are available. The fastest way to get your results is to activate your My Chart account. Instructions are located on the last page of this paperwork. If you have not heard from [place] regarding the results in 2 weeks, please contact this office.

## 2020-05-19 NOTE — Progress Notes (Addendum)
10/26/20212:40 PM  AYCE PIETRZYK 07/19/1961, 59 y.o., male [id number]  Chief Complaint  Patient presents with  . L leg pain and swelling    has a vein spec. appt 11/15, wakes up with stinging, tingling, throbbing pain    HPI:   Patient is a 59 y.o. male with past medical history significant for DM2, HTN, HLP, HCV s/p treatment who presents today for routine followup and leg pain.  Left leg pain: hurts all the time The worst at night When he walks the pain is improved Burning pain Pain improves when elevated Taking gabapentin 332m tid Wears compression socks Preventative care: keep moisturized   10/7L Last visit 1. Type 2 diabetes mellitus with hyperglycemia, without long-term current use of insulin (HCC) Controlled. Continue current regime.  - metFORMIN (GLUCOPHAGE-XR) 750 MG 24 hr tablet; TAKE 1 TABLET BY MOUTH ONCE DAILY IN THE MORNING WITH BREAKFAST Lab Results  Component Value Date   HGBA1C 7.0 (H) 04/30/2020    2. Essential hypertension Controlled. Continue current regime.  - hydrochlorothiazide (HYDRODIURIL) 25 MG tablet; Take 1 tablet (25 mg total) by mouth daily. BP Readings from Last 3 Encounters:  05/19/20 133/81  05/06/20 (!) 149/88  04/30/20 (!) 143/88    3. Mixed hyperlipidemia Checking labs today, medications will be adjusted as needed.  Atorvastatin Lab Results  Component Value Date   CHOL 127 04/30/2020   HDL 49 04/30/2020   LDLCALC 55 04/30/2020   TRIG 134 04/30/2020   CHOLHDL 2.6 04/30/2020    4. Pain in left lower leg Has upcoming exam with vascular surgeon. Increasing gabapentin to 3080mTID, reviewed r/se/b - gabapentin (NEURONTIN) 300 MG capsule; Take 1 capsule (300 mg total) by mouth 3 (three) times daily.  05/06/20: Dr. DiScot Dockascular Surgeon CHRONIC VENOUS INSUFFICIENCY: This patient has CEAP C4a venous disease.  We have discussed the importance of intermittent leg elevation and the proper positioning for this.  In  addition we have fitted him in some compression stockings with a gradient of 20 to 30 mmHg.  I have encouraged him to avoid prolonged sitting and standing.  We discussed the importance of exercise specifically walking and water aerobics.  We also discussed the importance of maintaining a healthy weight as central obesity especially increases lower extremity venous pressure.  As the winter is approaching I encouraged him to keep his skin well lubricated.  I would like to see him back in 3 months and we will get formal venous reflux testing at that time.  I did look at his superficial system myself in the great saphenous vein is not especially dilated.  Thus I suspect he has mostly deep venous reflux.  He knows to call sooner if he has problems.  I did reassure him that he has no evidence of arterial insufficiency.    [facility] 05/19/2020 04/30/2020 10/30/2019  Decreased Interest 0 0 0  Down, Depressed, Hopeless 0 0 0  PHQ - 2 Score 0 0 0    Fall Risk  05/19/2020 04/30/2020 10/30/2019 10/08/2018 04/11/2018  Falls in the past year? 0 0 0 0 No  Number falls in past yr: 0 0 0 - -  Injury with Fall? 0 0 0 0 -  Risk for fall due to : - No Fall Risks - - -  Follow up - Falls evaluation completed Falls evaluation completed - -     Allergies  Allergen Reactions  . Tylenol [Acetaminophen] Hypertension    Prior to Admission medications  Medication Sig Start Date End Date Taking? Authorizing Provider  amLODipine (NORVASC) 5 MG tablet Take 1 tablet by mouth once daily 04/05/20   Rutherford Guys, MD  atorvastatin (LIPITOR) 20 MG tablet Take 1 tablet by mouth once daily 04/05/20   Rutherford Guys, MD  Blood Glucose Monitoring Suppl (ONE TOUCH ULTRA 2) w/Device KIT USE AS DIRECTED TO TEST BLOOD SUGAR IN THE MORNING 08/07/16   Ivar Drape D, PA  Blood Glucose Monitoring Suppl KIT 1 application by Does not apply route every morning. 04/30/20   Rutherford Guys, MD  Blood Pressure Monitoring  (BLOOD PRESSURE CUFF) MISC Use as directed 07/07/17   Shawnee Knapp, MD  fluticasone [facility]) 50 MCG/ACT nasal spray USE 1 TO 2 SPRAY(S) IN EACH NOSTRIL ONCE DAILY 03/05/20   Rutherford Guys, MD  gabapentin (NEURONTIN) 300 MG capsule Take 1 capsule (300 mg total) by mouth 3 (three) times daily. 04/30/20   Rutherford Guys, MD  glucose blood (ONETOUCH VERIO) test strip USE ONE STRIP TO CHECK GLUCOSE TWICE DAILY 04/23/19   Rutherford Guys, MD  hydrochlorothiazide (HYDRODIURIL) 25 MG tablet Take 1 tablet (25 mg total) by mouth daily. 04/30/20   Rutherford Guys, MD  hydrocortisone (ANUSOL-HC) 25 MG suppository Place 1 suppository (25 mg total) rectally 2 (two) times daily. 10/30/19   Rutherford Guys, MD  metFORMIN (GLUCOPHAGE-XR) 750 MG 24 hr tablet TAKE 1 TABLET BY MOUTH ONCE DAILY IN THE MORNING WITH BREAKFAST 04/30/20   Rutherford Guys, MD    Past Medical History:  Diagnosis Date  . Diabetes mellitus without complication ([place])   . Fatty liver   . Hepatitis C   . Hypertension   . Tubular adenoma of colon 12/2015    Past Surgical History:  Procedure Laterality Date  . COLONOSCOPY      Social History   Tobacco Use  . Smoking status: Former Research scientist (life sciences)  . Smokeless tobacco: Never Used  Substance Use Topics  . Alcohol use: Yes    Comment: occasional wine    Family History  Problem Relation Age of Onset  . Heart disease Mother 55       CAD/AMI  . Diabetes Mother   . Hypertension Mother   . Diabetes Maternal Grandfather   . Diabetes Paternal Grandfather   . Diabetes Father   . Diabetes Sister   . Hypertension Sister   . Hypertension Brother   . Diabetes Brother   . Colon cancer Neg Hx   . Esophageal cancer Neg Hx   . Stomach cancer Neg Hx   . Rectal cancer Neg Hx     Review of Systems  Constitutional: Negative for chills, fever and malaise/fatigue.  Eyes: Negative for blurred vision and double vision.  Respiratory: Negative for cough, shortness of breath and wheezing.    Cardiovascular: Negative for chest pain, palpitations and leg swelling.  Gastrointestinal: Negative for abdominal pain, blood in stool, constipation, diarrhea, heartburn, nausea and vomiting.  Genitourinary: Negative for dysuria, frequency and hematuria.  Musculoskeletal: Negative for back pain and joint pain.       Left lower leg pain and discoloration  Skin: Negative for rash.  Neurological: Negative for dizziness, weakness and headaches.     OBJECTIVE:  Today's Vitals   05/19/20 1401  BP: 133/81  Pulse: 92  Temp: 98.7 F (37.1 C)  SpO2: 97%  Weight: 228 lb (103.4 kg)  Height: _0  (1.778 m)   Body mass index is 32.71 kg/m.  Physical Exam Vitals reviewed.  Constitutional:      Appearance: Normal appearance.  HENT:     Head: Normocephalic and atraumatic.  Eyes:     Conjunctiva/sclera: Conjunctivae normal.     Pupils: Pupils are equal, round, and reactive to light.  Cardiovascular:     Rate and Rhythm: Normal rate and regular rhythm.     Pulses: Normal pulses.          Dorsalis pedis pulses are 2+ on the right side and 2+ on the left side.       Posterior tibial pulses are 2+ on the right side and 2+ on the left side.     Heart sounds: Normal heart sounds. No murmur heard.  No friction rub. No gallop.   Pulmonary:     Effort: Pulmonary effort is normal. No respiratory distress.     Breath sounds: Normal breath sounds. No stridor. No wheezing or rales.  Abdominal:     General: Bowel sounds are normal.     Palpations: Abdomen is soft.     Tenderness: There is no abdominal tenderness.  Musculoskeletal:     Right lower leg: No edema.     Left lower leg: No edema.  Skin:    General: Skin is warm and dry.     Comments: Discoloration to Left lower leg, hairless, no wounds noted  Neurological:     General: No focal deficit present.     Mental Status: He is alert and oriented to person, place, and time.  Psychiatric:        Mood and Affect: Mood normal.         Behavior: Behavior normal.     No results found for this or any previous visit (from the past 24 hour(s)).  No results found.   ASSESSMENT and PLAN  Problem List Items Addressed This Visit      Cardiovascular and Mediastinum   Essential hypertension Stable on current regimen. BP Readings from Last 3 Encounters:  05/19/20 133/81  05/06/20 (!) 149/88  04/30/20 (!) 143/88     Chronic venous insufficiency - Primary   Relevant Medications   Capsaicin-Menthol-Methyl Sal (CAPSAICIN-METHYL SAL-MENTHOL) 0.025-1-12 % CREA   Lidocaine (HM LIDOCAINE PATCH) 4 % PTCH   gabapentin (NEURONTIN) 400 MG capsule Use Capsaicin cream during the day as needed Use lidocaine patches at night Taking gabapentin 437m three times a day Wear compression socks Preventative care: keep moisturized as able Discussed chronic nature of this disease. Discussed pathology of disease and reasons he would need to be concerned. Discussed risk factors of smoking and diabetes.     Endocrine   Diabetes ([place] Lab Results  Component Value Date   HGBA1C 7.0 (H) 04/30/2020   Stable on current regimen.    Other Visit Diagnoses    Mixed hyperlipidemia     Stable on current regimen. Lab Results  Component Value Date   CHOL 127 04/30/2020   HDL 49 04/30/2020   LDLCALC 55 04/30/2020   TRIG 134 04/30/2020   CHOLHDL 2.6 04/30/2020       Will follow up with labs at next visit.   Return for at Annual appointment.  I have reviewed and agree with above documentation. MAgustina Caroli MD   KHuston FoleyJust, FNP-BC [facility] [place] [id number].  [phone] [phone]

## 2020-05-20 ENCOUNTER — Encounter (HOSPITAL_COMMUNITY): Payer: BC Managed Care – PPO

## 2020-05-20 ENCOUNTER — Encounter: Payer: BC Managed Care – PPO | Admitting: Vascular Surgery

## 2020-06-10 ENCOUNTER — Encounter: Payer: BC Managed Care – PPO | Admitting: Family Medicine

## 2020-06-21 ENCOUNTER — Other Ambulatory Visit: Payer: Self-pay

## 2020-06-21 ENCOUNTER — Emergency Department (HOSPITAL_COMMUNITY)
Admission: EM | Admit: 2020-06-21 | Discharge: 2020-06-22 | Disposition: A | Discharge status: Home / Self Care | Payer: BC Managed Care – PPO | Attending: Emergency Medicine | Admitting: Emergency Medicine

## 2020-06-21 ENCOUNTER — Encounter (HOSPITAL_COMMUNITY): Payer: Self-pay

## 2020-06-21 DIAGNOSIS — R079 Chest pain, unspecified: Secondary | ICD-10-CM | POA: Insufficient documentation

## 2020-06-21 DIAGNOSIS — Z87891 Personal history of nicotine dependence: Secondary | ICD-10-CM | POA: Diagnosis not present

## 2020-06-21 DIAGNOSIS — R42 Dizziness and giddiness: Secondary | ICD-10-CM | POA: Diagnosis present

## 2020-06-21 DIAGNOSIS — Z79899 Other long term (current) drug therapy: Secondary | ICD-10-CM | POA: Insufficient documentation

## 2020-06-21 DIAGNOSIS — Z7984 Long term (current) use of oral hypoglycemic drugs: Secondary | ICD-10-CM | POA: Insufficient documentation

## 2020-06-21 DIAGNOSIS — I10 Essential (primary) hypertension: Secondary | ICD-10-CM | POA: Insufficient documentation

## 2020-06-21 DIAGNOSIS — E1165 Type 2 diabetes mellitus with hyperglycemia: Secondary | ICD-10-CM | POA: Diagnosis not present

## 2020-06-21 LAB — CBG MONITORING, ED
Glucose-Capillary: 597 mg/dL (ref 70–99)
Glucose-Capillary: 439 mg/dL — ABNORMAL HIGH (ref 70–99)

## 2020-06-21 LAB — CBC
HCT: 40.1 % (ref 39.0–52.0)
Hemoglobin: 12.7 g/dL — ABNORMAL LOW (ref 13.0–17.0)
MCH: 26.3 pg (ref 26.0–34.0)
MCHC: 31.7 g/dL (ref 30.0–36.0)
MCV: 83.2 fL (ref 80.0–100.0)
Platelets: 247 10*3/uL (ref 150–400)
RBC: 4.82 MIL/uL (ref 4.22–5.81)
RDW: 13.3 % (ref 11.5–15.5)
WBC: 8.5 10*3/uL (ref 4.0–10.5)
nRBC: 0 % (ref 0.0–0.2)

## 2020-06-21 LAB — BASIC METABOLIC PANEL
Anion gap: 13 (ref 5–15)
BUN: 15 mg/dL (ref 6–20)
CO2: 27 mmol/L (ref 22–32)
Calcium: 9.8 mg/dL (ref 8.9–10.3)
Chloride: 85 mmol/L — ABNORMAL LOW (ref 98–111)
Creatinine, Ser: 1.1 mg/dL (ref 0.61–1.24)
GFR, Estimated: >60 mL/min (ref >60)
Glucose, Bld: 549 mg/dL (ref 70–99)
Potassium: 4.1 mmol/L (ref 3.5–5.1)
Sodium: 125 mmol/L — ABNORMAL LOW (ref 135–145)

## 2020-06-21 MED ORDER — INSULIN GLARGINE 100 UNIT/ML ~~LOC~~ SOLN
5.0000 [IU] | Freq: Once | SUBCUTANEOUS | Status: AC
  Administered 2020-06-21: 5 [IU] via SUBCUTANEOUS
  Filled 2020-06-21: qty 0.05

## 2020-06-21 MED ORDER — LACTATED RINGERS IV BOLUS
1000.0000 mL | Freq: Once | INTRAVENOUS | Status: AC
  Administered 2020-06-21: 1000 mL via INTRAVENOUS

## 2020-06-21 NOTE — ED Provider Notes (Signed)
59 year old male received a signout from ER resident Dr. Darrick Huntsman   "Alejandro [name] is a 59 y.o. male with a PMH of diabetes on Metformin, hypertension, hepatitis C presents with a less than 30-minute episode of dizziness that he is unable to fully characterize 4 hours ago while he was making food in the kitchen.  No fall, no syncope.  Had a very brief episode of chest pain also describes having a change in his voice but no aphasia.  Symptoms resolved quickly but he has noted polyuria and polydipsia recently.  Given his new episode of dizziness today, he was concerned about hypoglycemia so presented to the ED.  States he has been taking his Metformin outpatient.  Patient does report that his diet has been poor recently given the recent Thanksgiving holiday and feels like this may be contributing to his hyperglycemia.  The history is provided by the patient.  Dizziness Quality:  Unable to specify Severity:  Mild Onset quality:  Sudden Duration: <30 min. Progression:  Resolved Chronicity:  New Context: not when bending over and not with head movement   Relieved by:  Nothing Worsened by:  Nothing Associated symptoms: chest pain (very brief episode, since resolved)   Associated symptoms: no diarrhea, no headaches, no hearing loss, no nausea, no palpitations, no shortness of breath, no syncope, no vision changes, no vomiting and no weakness   Associated symptoms comment:  Felt like his voice changed, polyuria, polydipsia"    Physical Exam  BP (!) 150/92 (BP Location: Left Arm)   Pulse 85   Temp 98.1 F (36.7 C) (Oral)   Resp 17   SpO2 98%   Physical Exam Vitals and nursing note reviewed.  Constitutional:      Appearance: He is well-developed.     Comments: NAD  HENT:     Head: Normocephalic and atraumatic.  Pulmonary:     Effort: Pulmonary effort is normal.  Musculoskeletal:     Cervical back: Neck supple.  Neurological:     Mental Status: He is alert and oriented to  person, place, and time.     Cranial Nerves: No cranial nerve deficit.  Psychiatric:        Behavior: Behavior normal.     ED Course/Procedures     Procedures  MDM  59 year old male received at signout of ER resident Dr. Darrick Huntsman working with Dr. Karlton Lemon.  Please see her note for further work-up and medical decision making.  After receiving Lantus and IV fluids ordered by Dr. Lurena Nida, repeat CBG 297, down from 549.  There was no evidence of DKA or HHS on labs.  Patient did that glucose has been well controlled, but he has had a poor diet over the last week.  Shared decision-making conversation with the patient regarding increasing his home Metformin versus closely monitoring his diet and following with primary care.  I think this is reasonable since patient has good outpatient follow-up.  I have also sent a message in epic to his PCP. All questions answered.  ER return precautions given.  He is hemodynamically stable no acute distress.  Safe for discharge home with outpatient follow-up as indicated.       Joline Maxcy A, PA-C 06/22/20 Ione, Delice Bison, DO 06/22/20 [phone]

## 2020-06-21 NOTE — Discharge Instructions (Addendum)
Thank you for allowing me to care for you today in the Emergency Department.   Please continue to take your home medications as prescribed.  Please call primary primary care tomorrow to schedule a follow-up appointment and let them know you were seen in the ER with high blood sugar.  Please keep a close eye on your diet as eating foods and consuming beverages with a high carbohydrate content or high and sugar can cause her blood sugar to elevate.  Return to the emergency department if you develop chest pain, difficulty breathing, dizziness, visual changes, severe abdominal pain, uncontrollable vomiting, confusion, or other new, concerning symptoms.

## 2020-06-21 NOTE — ED Provider Notes (Signed)
[facility]ERG[facility] DEPARTMENT Provider Note   CSN: [id number] Arrival date & time: 06/21/20  1706     History CC: dizziness  Alejandro [name] is a 59 y.o. male with a PMH of diabetes on Metformin, hypertension, hepatitis C presents with a less than 30-minute episode of dizziness that he is unable to fully characterize 4 hours ago while he was making food in the kitchen.  No fall, no syncope.  Had a very brief episode of chest pain also describes having a change in his voice but no aphasia.  Symptoms resolved quickly but he has noted polyuria and polydipsia recently.  Given his new episode of dizziness today, he was concerned about hypoglycemia so presented to the ED.  States he has been taking his Metformin outpatient.  Patient does report that his diet has been poor recently given the recent Thanksgiving holiday and feels like this may be contributing to his hyperglycemia.  The history is provided by the patient.  Dizziness Quality:  Unable to specify Severity:  Mild Onset quality:  Sudden Duration: <30 min. Progression:  Resolved Chronicity:  New Context: not when bending over and not with head movement   Relieved by:  Nothing Worsened by:  Nothing Associated symptoms: chest pain (very brief episode, since resolved)   Associated symptoms: no diarrhea, no headaches, no hearing loss, no nausea, no palpitations, no shortness of breath, no syncope, no vision changes, no vomiting and no weakness   Associated symptoms comment:  Felt like his voice changed, polyuria, polydipsia      Past Medical History:  Diagnosis Date  . Diabetes mellitus without complication ([place])   . Fatty liver   . Hepatitis C   . Hypertension   . Tubular adenoma of colon 12/2015    Patient Active Problem List   Diagnosis Date Noted  . Chronic venous insufficiency 05/19/2020  . Essential hypertension 03/11/2017  . Diabetes ([place]) 09/14/2014  . Gallstones 09/14/2014  . Chronic  hepatitis C virus infection ([place]) 12/01/2008  . ANEMIA, IRON DEFICIENCY 12/01/2008  . BLOOD IN STOOL 12/01/2008    Past Surgical History:  Procedure Laterality Date  . COLONOSCOPY         Family History  Problem Relation Age of Onset  . Heart disease Mother 42       CAD/AMI  . Diabetes Mother   . Hypertension Mother   . Diabetes Maternal Grandfather   . Diabetes Paternal Grandfather   . Diabetes Father   . Diabetes Sister   . Hypertension Sister   . Hypertension Brother   . Diabetes Brother   . Colon cancer Neg Hx   . Esophageal cancer Neg Hx   . Stomach cancer Neg Hx   . Rectal cancer Neg Hx     Social History   Tobacco Use  . Smoking status: Former Research scientist (life sciences)  . Smokeless tobacco: Never Used  Vaping Use  . Vaping Use: Never used  Substance Use Topics  . Alcohol use: Yes    Comment: occasional wine  . Drug use: No    Home Medications Prior to Admission medications   Medication Sig Start Date End Date Taking? Authorizing Provider  amLODipine (NORVASC) 5 MG tablet Take 1 tablet by mouth once daily 04/05/20   Rutherford Guys, MD  atorvastatin (LIPITOR) 20 MG tablet Take 1 tablet by mouth once daily 04/05/20   Rutherford Guys, MD  Blood Glucose Monitoring Suppl (ONE TOUCH ULTRA 2) w/Device KIT USE AS DIRECTED TO  TEST BLOOD SUGAR IN THE MORNING 08/07/16   Ivar Drape D, PA  Blood Glucose Monitoring Suppl KIT 1 application by Does not apply route every morning. 04/30/20   Rutherford Guys, MD  Blood Pressure Monitoring (BLOOD PRESSURE CUFF) MISC Use as directed 07/07/17   Shawnee Knapp, MD  Capsaicin-Menthol-Methyl Sal (CAPSAICIN-METHYL SAL-MENTHOL) 0.025-1-12 % CREA Apply 1 application topically 3 (three) times daily as needed (Leg pain). 05/19/20   Just, Laurita Quint, FNP  fluticasone (FLONASE) 50 MCG/ACT nasal spray USE 1 TO 2 SPRAY(S) IN EACH NOSTRIL ONCE DAILY 03/05/20   Rutherford Guys, MD  gabapentin (NEURONTIN) 400 MG capsule Take 1 capsule (400 mg total) by mouth  3 (three) times daily. 05/19/20   Just, Laurita Quint, FNP  glucose blood (ONETOUCH VERIO) test strip USE ONE STRIP TO CHECK GLUCOSE TWICE DAILY 04/23/19   Rutherford Guys, MD  hydrochlorothiazide (HYDRODIURIL) 25 MG tablet Take 1 tablet (25 mg total) by mouth daily. 04/30/20   Rutherford Guys, MD  hydrocortisone (ANUSOL-HC) 25 MG suppository Place 1 suppository (25 mg total) rectally 2 (two) times daily. 10/30/19   Rutherford Guys, MD  Lidocaine (HM LIDOCAINE PATCH) 4 % PTCH Apply 1 patch topically daily. Apply at night, leave on for 12 hours, remove in the morning issues 05/19/20   Just, Laurita Quint, FNP  metFORMIN (GLUCOPHAGE-XR) 750 MG 24 hr tablet TAKE 1 TABLET BY MOUTH ONCE DAILY IN THE MORNING WITH BREAKFAST 04/30/20   Rutherford Guys, MD    Allergies    Tylenol [acetaminophen]  Review of Systems   Review of Systems  Constitutional: Negative for chills and fever.  HENT: Negative for ear pain, hearing loss and sore throat.   Eyes: Negative for pain and visual disturbance.  Respiratory: Negative for cough and shortness of breath.   Cardiovascular: Positive for chest pain (very brief episode, since resolved). Negative for palpitations and syncope.  Gastrointestinal: Negative for abdominal pain, diarrhea, nausea and vomiting.  Genitourinary: Negative for dysuria and hematuria.  Musculoskeletal: Negative for arthralgias and back pain.  Skin: Negative for color change and rash.  Neurological: Positive for dizziness. Negative for seizures, syncope, weakness and headaches.  All other systems reviewed and are negative.   Physical Exam Updated Vital Signs BP (!) 141/86 (BP Location: Right Arm)   Pulse 78   Temp 98.8 F (37.1 C) (Oral)   Resp 16   SpO2 99%   Physical Exam Vitals and nursing note reviewed.  Constitutional:      General: He is not in acute distress.    Appearance: He is well-developed. He is obese. He is not ill-appearing or toxic-appearing.  HENT:     Head: Normocephalic  and atraumatic.     Mouth/Throat:     Mouth: Mucous membranes are moist.     Pharynx: No posterior oropharyngeal erythema.  Eyes:     Extraocular Movements: Extraocular movements intact.     Conjunctiva/sclera: Conjunctivae normal.     Pupils: Pupils are equal, round, and reactive to light.  Cardiovascular:     Rate and Rhythm: Normal rate and regular rhythm.     Heart sounds: No murmur heard.   Pulmonary:     Effort: Pulmonary effort is normal. No respiratory distress.     Breath sounds: Normal breath sounds.  Abdominal:     Palpations: Abdomen is soft.     Tenderness: There is no abdominal tenderness.  Musculoskeletal:     Cervical back: Neck supple.  Skin:  General: Skin is warm and dry.  Neurological:     General: No focal deficit present.     Mental Status: He is alert and oriented to person, place, and time. Mental status is at baseline.     Cranial Nerves: No cranial nerve deficit.     Sensory: No sensory deficit.     Motor: No weakness.     Coordination: Coordination normal.     Gait: Gait normal.     ED Results / Procedures / Treatments   Labs (all labs ordered are listed, but only abnormal results are displayed) Labs Reviewed  BASIC METABOLIC PANEL - Abnormal; Notable for the following components:      Result Value   Sodium 125 (*)    Chloride 85 (*)    Glucose, Bld 549 (*)    All other components within normal limits  CBC - Abnormal; Notable for the following components:   Hemoglobin 12.7 (*)    All other components within normal limits  CBG MONITORING, ED - Abnormal; Notable for the following components:   Glucose-Capillary 597 (*)    All other components within normal limits  CBG MONITORING, ED - Abnormal; Notable for the following components:   Glucose-Capillary 439 (*)    All other components within normal limits  URINALYSIS, ROUTINE W REFLEX MICROSCOPIC  CBG MONITORING, ED  CBG MONITORING, ED    EKG EKG Interpretation  Date/Time:  Sunday  June 21 2020 17:37:36 EST Ventricular Rate:  83 PR Interval:  148 QRS Duration: 94 QT Interval:  364 QTC Calculation: 427 R Axis:   -25 Text Interpretation: Normal sinus rhythm Possible Left atrial enlargement Left ventricular hypertrophy ( R in aVL , Cornell product ) Abnormal ECG No old tracing to compare Confirmed by Calvert Cantor [phone]) on 06/21/2020 10:32:49 PM   Radiology No results found.  Procedures Procedures (including critical care time)  Medications Ordered in ED Medications  lactated ringers bolus 1,000 mL (has no administration in time range)  insulin glargine (LANTUS) injection 5 Units (has no administration in time range)    ED Course  I have reviewed the triage vital signs and the nursing notes.  Pertinent labs & imaging results that were available during my care of the patient were reviewed by me and considered in my medical decision making (see chart for details).    MDM Rules/Calculators/A&P                          MDM: Taivon Haroon is a 59 y.o. male who presents with dizziness as per above. I have reviewed the nursing documentation for past medical history, family history, and social history. Pertinent previous records reviewed. He is awake, alert. HDS. Afebrile. Physical exam is most notable for normal neuro exam including normal gait.  Labs: Glucose 550, bicarb 27, sodium 125, CBC unremarkable. Repeat CBG pending. EKG: NSR, left axis deviation. QTc, PR, and QRS within appropriate limits. No signs of acute ischemia, infarct, or significant electrical abnormalities. No STEMI, ST depressions, or significant T wave inversions. No evidence of a High-Grade Conduction Block, WPW, Brugada Sign, ARVC, DeWinters T Waves, or Wellens Waves. Consults: none Tx: 5 units Lantus, 1 L LR.  Differential Dx: I am most concerned for type 2 diabetes with hyperglycemia secondary to diet. Given history, physical exam, and work-up, I do not think he has DKA, HHS, ACS,  stroke, head trauma, vertebral or cervical artery dissection/aneurysm, vertebrobasilar insufficiency, or malignancy.  MDM: Elberta Fortis  AKIRA ADELSBERGER is a 59 y.o. male with a history of diabetes on Metformin presents with a brief episode of dizziness and persistent hyperglycemia with associated polyuria and polydipsia.  Normal neuro exam and resolved symptoms at this time other than patient's hyperglycemia.  Given history and exam, low concern for cardiac etiology and feel that his dizziness is basically related to possible anxiety versus hyperglycemia.  Glucose 557. Given fluids and and 5 units Lantus.  Plan to recheck CBG and if improving then will discharge with increased Metformin dose.  Per chart review, patient currently takes 750 mg Metformin daily so will plan to increase to 1092m at discharge.   Transfer of care at 2300 to oncoming ED team. Assessment and plan of care communicated including recess glucose after Lantus and fluids.  If improving, then will plan to discharge home with increased Metformin dose with close PCP follow-up.. Patient in stable  condition at time of transfer.   The plan for this patient was discussed with Dr. SKarle Starch who voiced agreement and who oversaw evaluation and treatment of this patient.   Final Clinical Impression(s) / ED Diagnoses Final diagnoses:  None    Rx / DC Orders ED Discharge Orders    None       Rital Cavey, MD 06/22/20 0010    STruddie Hidden MD 06/22/20 1047

## 2020-06-21 NOTE — ED Triage Notes (Signed)
Patient complains of blood sugar running high the past several days, taking metformin as prescribed. This am had dizziness and noted blood sugar 422. Reports increased thirst and urination with same. Denies pain, alert and oriented

## 2020-06-22 ENCOUNTER — Ambulatory Visit: Payer: Self-pay | Admitting: *Deleted

## 2020-06-22 ENCOUNTER — Encounter (HOSPITAL_COMMUNITY): Payer: Self-pay

## 2020-06-22 ENCOUNTER — Emergency Department (HOSPITAL_COMMUNITY)
Admission: EM | Admit: 2020-06-22 | Discharge: 2020-06-22 | Disposition: A | Discharge status: Home / Self Care | Payer: BC Managed Care – PPO | Source: Home / Self Care | Attending: Emergency Medicine | Admitting: Emergency Medicine

## 2020-06-22 ENCOUNTER — Other Ambulatory Visit: Payer: Self-pay

## 2020-06-22 DIAGNOSIS — E1165 Type 2 diabetes mellitus with hyperglycemia: Secondary | ICD-10-CM | POA: Insufficient documentation

## 2020-06-22 DIAGNOSIS — Z87891 Personal history of nicotine dependence: Secondary | ICD-10-CM | POA: Insufficient documentation

## 2020-06-22 DIAGNOSIS — Z79899 Other long term (current) drug therapy: Secondary | ICD-10-CM | POA: Insufficient documentation

## 2020-06-22 DIAGNOSIS — Z7984 Long term (current) use of oral hypoglycemic drugs: Secondary | ICD-10-CM | POA: Insufficient documentation

## 2020-06-22 DIAGNOSIS — R739 Hyperglycemia, unspecified: Secondary | ICD-10-CM

## 2020-06-22 DIAGNOSIS — I10 Essential (primary) hypertension: Secondary | ICD-10-CM | POA: Insufficient documentation

## 2020-06-22 LAB — CBC WITH DIFFERENTIAL/PLATELET
Abs Immature Granulocytes: 0.02 10*3/uL (ref 0.00–0.07)
Basophils Absolute: 0 10*3/uL (ref 0.0–0.1)
Basophils Relative: 0 %
Eosinophils Absolute: 0.1 10*3/uL (ref 0.0–0.5)
Eosinophils Relative: 1 %
HCT: 41.3 % (ref 39.0–52.0)
Hemoglobin: 13 g/dL (ref 13.0–17.0)
Immature Granulocytes: 0 %
Lymphocytes Relative: 23 %
Lymphs Abs: 1.7 10*3/uL (ref 0.7–4.0)
MCH: 26.3 pg (ref 26.0–34.0)
MCHC: 31.5 g/dL (ref 30.0–36.0)
MCV: 83.4 fL (ref 80.0–100.0)
Monocytes Absolute: 0.6 10*3/uL (ref 0.1–1.0)
Monocytes Relative: 8 %
Neutro Abs: 5 10*3/uL (ref 1.7–7.7)
Neutrophils Relative %: 68 %
Platelets: 262 10*3/uL (ref 150–400)
RBC: 4.95 MIL/uL (ref 4.22–5.81)
RDW: 13.3 % (ref 11.5–15.5)
WBC: 7.5 10*3/uL (ref 4.0–10.5)
nRBC: 0 % (ref 0.0–0.2)

## 2020-06-22 LAB — BASIC METABOLIC PANEL
Anion gap: 12 (ref 5–15)
BUN: 13 mg/dL (ref 6–20)
CO2: 24 mmol/L (ref 22–32)
Calcium: 9.3 mg/dL (ref 8.9–10.3)
Chloride: 89 mmol/L — ABNORMAL LOW (ref 98–111)
Creatinine, Ser: 1.06 mg/dL (ref 0.61–1.24)
GFR, Estimated: >60 mL/min (ref >60)
Glucose, Bld: 575 mg/dL (ref 70–99)
Potassium: 4.4 mmol/L (ref 3.5–5.1)
Sodium: 125 mmol/L — ABNORMAL LOW (ref 135–145)

## 2020-06-22 LAB — CBG MONITORING, ED
Glucose-Capillary: 297 mg/dL — ABNORMAL HIGH (ref 70–99)
Glucose-Capillary: 333 mg/dL — ABNORMAL HIGH (ref 70–99)
Glucose-Capillary: 491 mg/dL — ABNORMAL HIGH (ref 70–99)

## 2020-06-22 MED ORDER — INSULIN ASPART 100 UNIT/ML ~~LOC~~ SOLN
8.0000 [IU] | Freq: Once | SUBCUTANEOUS | Status: AC
  Administered 2020-06-22: 8 [IU] via INTRAVENOUS

## 2020-06-22 MED ORDER — SODIUM CHLORIDE 0.9 % IV BOLUS
1000.0000 mL | Freq: Once | INTRAVENOUS | Status: AC
  Administered 2020-06-22: 1000 mL via INTRAVENOUS

## 2020-06-22 NOTE — Telephone Encounter (Signed)
Pt called in on the Community line.   "I was calling the emergency room to see what to do".   "Should I come back?"  Pt went to [facility] ED last night for elevated glucose.  He got home around 2:00 AM.   This morning he is still feeling bad.  Weak, dizzy and peeing a lot.   "I just want to go to bed but I know I need to get checked".  I found out he is a pt at [facility] on [address].   He was seeing Dr. Pamella Pert who is no longer there.   He did not want to call them because "It takes too long for them to decide what to do".    "I would rather go to the ED".   "They are going to assign another dr. For me.  I encouraged him to drink water and since he was more comfortable going back to the ED instead of Primary Care on [address]  He is going to call a friend to take him to the ED now.  Reason for Disposition . Blood glucose > 400 mg/dL (22.2 mmol/L)  Answer Assessment - Initial Assessment Questions 1. BLOOD GLUCOSE: "What is your blood glucose level?"      My glucose is 470.    Do not use insulin.    I went to the hospital last night.   I got home at 2:00 AM.   I'm peeing a lot.    2. ONSET: "When did you check the blood glucose?"     A few minutes ago.   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   I came back home and checked my sugar.   I take Metformin. 3. USUAL RANGE: "What is your glucose level usually?" (e.g., usual fasting morning value, usual evening value)     During last 2 months 200-300.  I go to Primary Care at [facility].   I'm a new pt not been seen there yet.   4. KETONES: "Do you check for ketones (urine or blood test strips)?" If yes, ask: "What does the test show now?"      No 5. TYPE 1 or 2:  "Do you know what type of diabetes you have?"  (e.g., Type 1, Type 2, Gestational; doesn't know)      Type 2 6. INSULIN: "Do you take insulin?" "What type of insulin(s) do you use? What is the mode of delivery? (syringe, pen; injection or pump)?"      No 7. DIABETES PILLS: "Do you  take any pills for your diabetes?" If yes, ask: "Have you missed taking any pills recently?"     Metformin   And a BP pill.   Also Amlodipine, Atorvastatin and HCTZ.   Also gabapentin.   They were prescribed at [facility].  Dr. Pamella Pert.   8. OTHER SYMPTOMS: "Do you have any symptoms?" (e.g., fever, frequent urination, difficulty breathing, dizziness, weakness, vomiting)     I'm wondering if I should come back to the [facility] ED.    Having a little dizziness.    9. PREGNANCY: "Is there any chance you are pregnant?" "When was your last menstrual period?"     N/A  Protocols used: DIABETES - HIGH BLOOD SUGAR-A-AH

## 2020-06-22 NOTE — Discharge Instructions (Addendum)
Continue medications as previously prescribed.  Watch your sugar/carbohydrate intake for the next few days.  Keep a record of your blood sugars over the next 2 days, then take this with you to your scheduled doctor's appointment on Wednesday.  Return to the emergency department in the meantime if your symptoms significantly worsen or change.

## 2020-06-22 NOTE — ED Provider Notes (Signed)
[facility] EMERGENCY DEPARTMENT Provider Note   CSN: [id number] Arrival date & time: 06/22/20  6195     History Chief Complaint  Patient presents with  . Hyperglycemia    Alejandro [name] is a 59 y.o. male.  Patient is a 59 year old male with history of diabetes, hypertension, hepatitis C.  He presents today for evaluation of elevated blood sugar.  He was seen here last night with similar complaints.  He was given fluids and insulin and sugars improved.  He checked his sugar at home and it was 500, so presents for evaluation of this.  He reports frequent urination and increased thirst.  He also describes eating more over the Thanksgiving holiday.  The history is provided by the patient.       Past Medical History:  Diagnosis Date  . Diabetes mellitus without complication ([place])   . Fatty liver   . Hepatitis C   . Hypertension   . Tubular adenoma of colon 12/2015    Patient Active Problem List   Diagnosis Date Noted  . Chronic venous insufficiency 05/19/2020  . Essential hypertension 03/11/2017  . Diabetes ([place]) 09/14/2014  . Gallstones 09/14/2014  . Chronic hepatitis C virus infection ([place]) 12/01/2008  . ANEMIA, IRON DEFICIENCY 12/01/2008  . BLOOD IN STOOL 12/01/2008    Past Surgical History:  Procedure Laterality Date  . COLONOSCOPY         Family History  Problem Relation Age of Onset  . Heart disease Mother 35       CAD/AMI  . Diabetes Mother   . Hypertension Mother   . Diabetes Maternal Grandfather   . Diabetes Paternal Grandfather   . Diabetes Father   . Diabetes Sister   . Hypertension Sister   . Hypertension Brother   . Diabetes Brother   . Colon cancer Neg Hx   . Esophageal cancer Neg Hx   . Stomach cancer Neg Hx   . Rectal cancer Neg Hx     Social History   Tobacco Use  . Smoking status: Former Research scientist (life sciences)  . Smokeless tobacco: Never Used  Vaping Use  . Vaping Use: Never used  Substance Use Topics  . Alcohol use: Yes     Comment: occasional wine  . Drug use: No    Home Medications Prior to Admission medications   Medication Sig Start Date End Date Taking? Authorizing Provider  amLODipine (NORVASC) 5 MG tablet Take 1 tablet by mouth once daily 04/05/20   Rutherford Guys, MD  atorvastatin (LIPITOR) 20 MG tablet Take 1 tablet by mouth once daily 04/05/20   Rutherford Guys, MD  Blood Glucose Monitoring Suppl (ONE TOUCH ULTRA 2) w/Device KIT USE AS DIRECTED TO TEST BLOOD SUGAR IN THE MORNING 08/07/16   Ivar Drape D, PA  Blood Glucose Monitoring Suppl KIT 1 application by Does not apply route every morning. 04/30/20   Rutherford Guys, MD  Blood Pressure Monitoring (BLOOD PRESSURE CUFF) MISC Use as directed 07/07/17   Shawnee Knapp, MD  Capsaicin-Menthol-Methyl Sal (CAPSAICIN-METHYL SAL-MENTHOL) 0.025-1-12 % CREA Apply 1 application topically 3 (three) times daily as needed (Leg pain). 05/19/20   Just, Laurita Quint, FNP  fluticasone (FLONASE) 50 MCG/ACT nasal spray USE 1 TO 2 SPRAY(S) IN EACH NOSTRIL ONCE DAILY 03/05/20   Rutherford Guys, MD  gabapentin (NEURONTIN) 400 MG capsule Take 1 capsule (400 mg total) by mouth 3 (three) times daily. 05/19/20   Just, Laurita Quint, FNP  glucose blood (ONETOUCH  VERIO) test strip USE ONE STRIP TO CHECK GLUCOSE TWICE DAILY 04/23/19   Rutherford Guys, MD  hydrochlorothiazide (HYDRODIURIL) 25 MG tablet Take 1 tablet (25 mg total) by mouth daily. 04/30/20   Rutherford Guys, MD  hydrocortisone (ANUSOL-HC) 25 MG suppository Place 1 suppository (25 mg total) rectally 2 (two) times daily. 10/30/19   Rutherford Guys, MD  Lidocaine (HM LIDOCAINE PATCH) 4 % PTCH Apply 1 patch topically daily. Apply at night, leave on for 12 hours, remove in the morning issues 05/19/20   Just, Laurita Quint, FNP  metFORMIN (GLUCOPHAGE-XR) 750 MG 24 hr tablet TAKE 1 TABLET BY MOUTH ONCE DAILY IN THE MORNING WITH BREAKFAST 04/30/20   Rutherford Guys, MD    Allergies    Tylenol [acetaminophen]  Review of Systems     Review of Systems  All other systems reviewed and are negative.   Physical Exam Updated Vital Signs BP (!) 161/76   Pulse 89   Temp 98.7 F (37.1 C) (Oral)   Resp 18   SpO2 98%   Physical Exam Vitals and nursing note reviewed.  Constitutional:      General: He is not in acute distress.    Appearance: He is well-developed. He is not diaphoretic.  HENT:     Head: Normocephalic and atraumatic.  Cardiovascular:     Rate and Rhythm: Normal rate and regular rhythm.     Heart sounds: No murmur heard.  No friction rub.  Pulmonary:     Effort: Pulmonary effort is normal. No respiratory distress.     Breath sounds: Normal breath sounds. No wheezing or rales.  Abdominal:     General: Bowel sounds are normal. There is no distension.     Palpations: Abdomen is soft.     Tenderness: There is no abdominal tenderness.  Musculoskeletal:        General: No swelling or tenderness. Normal range of motion.     Cervical back: Normal range of motion and neck supple.     Right lower leg: No edema.     Left lower leg: No edema.  Skin:    General: Skin is warm and dry.  Neurological:     Mental Status: He is alert and oriented to person, place, and time.     Coordination: Coordination normal.     ED Results / Procedures / Treatments   Labs (all labs ordered are listed, but only abnormal results are displayed) Labs Reviewed  CBG MONITORING, ED - Abnormal; Notable for the following components:      Result Value   Glucose-Capillary 491 (*)    All other components within normal limits  BASIC METABOLIC PANEL  CBC WITH DIFFERENTIAL/PLATELET    EKG None  Radiology No results found.  Procedures Procedures (including critical care time)  Medications Ordered in ED Medications  sodium chloride 0.9 % bolus 1,000 mL (has no administration in time range)  insulin aspart (novoLOG) injection 8 Units (has no administration in time range)    ED Course  I have reviewed the triage vital  signs and the nursing notes.  Pertinent labs & imaging results that were available during my care of the patient were reviewed by me and considered in my medical decision making (see chart for details).    MDM Rules/Calculators/A&P  Patient with history of type 2 diabetes presenting with elevated blood sugar.  Patient seen earlier today with similar complaints.  Patient was given IV fluids along with insulin.  His sugar  is now 300.  There is no evidence for DKA or other significant laboratory abnormality.  At this point, patient seems appropriate for discharge.  He tells me he has an appointment in 2 days with his primary doctor.  I have advised him to keep a record of blood sugars and take this with him to his next appointment.  Patient will likely need adjustments of his diabetes medications.  Final Clinical Impression(s) / ED Diagnoses Final diagnoses:  None    Rx / DC Orders ED Discharge Orders    None       Veryl Speak, MD 06/22/20 1424

## 2020-06-22 NOTE — ED Triage Notes (Signed)
Pt reports hyperglycemia, pt seen last night for the same and discharged. States it was 470 this morning and says he is still feeling bad.

## 2020-06-24 ENCOUNTER — Encounter: Payer: Self-pay | Admitting: Family Medicine

## 2020-06-24 ENCOUNTER — Other Ambulatory Visit: Payer: Self-pay

## 2020-06-24 ENCOUNTER — Ambulatory Visit: Payer: BC Managed Care – PPO | Admitting: Family Medicine

## 2020-06-24 ENCOUNTER — Telehealth: Payer: Self-pay | Admitting: Family Medicine

## 2020-06-24 VITALS — BP 144/87 | HR 95 | Temp 98.3°F | Ht 70.0 in | Wt 226.0 lb

## 2020-06-24 DIAGNOSIS — E1165 Type 2 diabetes mellitus with hyperglycemia: Secondary | ICD-10-CM

## 2020-06-24 DIAGNOSIS — E119 Type 2 diabetes mellitus without complications: Secondary | ICD-10-CM | POA: Diagnosis not present

## 2020-06-24 LAB — GLUCOSE, POCT (MANUAL RESULT ENTRY)

## 2020-06-24 LAB — POCT GLYCOSYLATED HEMOGLOBIN (HGB A1C): Hemoglobin A1C: 9.8 % — AB (ref 4.0–5.6)

## 2020-06-24 MED ORDER — TRULICITY 0.75 MG/0.5ML ~~LOC~~ SOAJ: 0.7500 mg | SUBCUTANEOUS | 4 refills | Status: DC

## 2020-06-24 MED ORDER — METFORMIN HCL 1000 MG PO TABS: 1000.0000 mg | ORAL_TABLET | Freq: Two times a day (BID) | ORAL | 6 refills | Status: DC

## 2020-06-24 MED ORDER — TRULICITY 1.5 MG/0.5ML ~~LOC~~ SOAJ: 1.5000 mg | SUBCUTANEOUS | 10 refills | Status: DC

## 2020-06-24 NOTE — Progress Notes (Addendum)
12/1/20212:07 PM  Alejandro [name] 1960-10-16, 59 y.o., male [id number]  Chief Complaint  Patient presents with  . elevated glucose levels    checking readings at home 400's and 300"s has been to ER multiple     HPI:   Patient is a 59 y.o. male with past medical history significant for HTN, Hep C, DM who presents today for Elevated BG.  Metformin 750: takes this daily. BG Since October 31st home BG has been elevated. Has been to the ED twice this week, where he received Lantus and was sent home. No signs of DKA in the ED. He has been not following his diet due to his frustrations over his leg pain.   Lab Results  Component Value Date   HGBA1C 9.8 (A) 06/24/2020   HGBA1C 7.0 (H) 04/30/2020   HGBA1C 6.9 (H) 10/30/2019   Lab Results  Component Value Date   MICROALBUR <0.2 08/26/2015   LDLCALC 55 04/30/2020   CREATININE 1.06 06/22/2020     Depression screen PHQ 2/9 06/24/2020 05/19/2020 04/30/2020  Decreased Interest 0 0 0  Down, Depressed, Hopeless 0 0 0  PHQ - 2 Score 0 0 0    Fall Risk  06/24/2020 05/19/2020 04/30/2020 10/30/2019 10/08/2018  Falls in the past year? 0 0 0 0 0  Number falls in past yr: 0 0 0 0 -  Injury with Fall? 0 0 0 0 0  Risk for fall due to : - - No Fall Risks - -  Follow up Falls evaluation completed - Falls evaluation completed Falls evaluation completed -     Allergies  Allergen Reactions  . Tylenol [Acetaminophen] Hypertension    Prior to Admission medications   Medication Sig Start Date End Date Taking? Authorizing Provider  amLODipine (NORVASC) 5 MG tablet Take 1 tablet by mouth once daily 04/05/20  Yes Rutherford Guys, MD  atorvastatin (LIPITOR) 20 MG tablet Take 1 tablet by mouth once daily 04/05/20  Yes Rutherford Guys, MD  Blood Glucose Monitoring Suppl (ONE TOUCH ULTRA 2) w/Device KIT USE AS DIRECTED TO TEST BLOOD SUGAR IN THE MORNING 08/07/16  Yes English, Stephanie D, PA  Blood Glucose Monitoring Suppl KIT 1 application by Does  not apply route every morning. 04/30/20  Yes Rutherford Guys, MD  Blood Pressure Monitoring (BLOOD PRESSURE CUFF) MISC Use as directed 07/07/17  Yes Shawnee Knapp, MD  Capsaicin-Menthol-Methyl Sal (CAPSAICIN-METHYL SAL-MENTHOL) 0.025-1-12 % CREA Apply 1 application topically 3 (three) times daily as needed (Leg pain). 05/19/20  Yes Abhi Moccia, Laurita Quint, FNP  fluticasone (FLONASE) 50 MCG/ACT nasal spray USE 1 TO 2 SPRAY(S) IN EACH NOSTRIL ONCE DAILY 03/05/20  Yes Rutherford Guys, MD  gabapentin (NEURONTIN) 400 MG capsule Take 1 capsule (400 mg total) by mouth 3 (three) times daily. 05/19/20  Yes Nichoals Heyde, Laurita Quint, FNP  glucose blood (ONETOUCH VERIO) test strip USE ONE STRIP TO CHECK GLUCOSE TWICE DAILY 04/23/19  Yes Rutherford Guys, MD  hydrochlorothiazide (HYDRODIURIL) 25 MG tablet Take 1 tablet (25 mg total) by mouth daily. 04/30/20  Yes Rutherford Guys, MD  hydrocortisone (ANUSOL-HC) 25 MG suppository Place 1 suppository (25 mg total) rectally 2 (two) times daily. 10/30/19  Yes Rutherford Guys, MD  Lidocaine (HM LIDOCAINE PATCH) 4 % PTCH Apply 1 patch topically daily. Apply at night, leave on for 12 hours, remove in the morning issues 05/19/20  Yes Wade Sigala, Laurita Quint, FNP  metFORMIN (GLUCOPHAGE-XR) 750 MG 24 hr tablet TAKE 1 TABLET  BY MOUTH ONCE DAILY IN THE MORNING WITH BREAKFAST 04/30/20  Yes Rutherford Guys, MD    Past Medical History:  Diagnosis Date  . Diabetes mellitus without complication ([place])   . Fatty liver   . Hepatitis C   . Hypertension   . Tubular adenoma of colon 12/2015    Past Surgical History:  Procedure Laterality Date  . COLONOSCOPY      Social History   Tobacco Use  . Smoking status: Former Research scientist (life sciences)  . Smokeless tobacco: Never Used  Substance Use Topics  . Alcohol use: Yes    Comment: occasional wine    Family History  Problem Relation Age of Onset  . Heart disease Mother 31       CAD/AMI  . Diabetes Mother   . Hypertension Mother   . Diabetes Maternal Grandfather   .  Diabetes Paternal Grandfather   . Diabetes Father   . Diabetes Sister   . Hypertension Sister   . Hypertension Brother   . Diabetes Brother   . Colon cancer Neg Hx   . Esophageal cancer Neg Hx   . Stomach cancer Neg Hx   . Rectal cancer Neg Hx     Review of Systems  Constitutional: Negative for chills, fever and malaise/fatigue.  Eyes: Negative for blurred vision and double vision.  Respiratory: Negative for cough, shortness of breath and wheezing.   Cardiovascular: Negative for chest pain, palpitations and leg swelling.  Gastrointestinal: Negative for abdominal pain, blood in stool, constipation, diarrhea, heartburn, nausea and vomiting.  Genitourinary: Negative for dysuria, frequency and hematuria.  Musculoskeletal: Negative for back pain and joint pain.  Skin: Negative for rash.  Neurological: Negative for dizziness, weakness and headaches.     OBJECTIVE:  Today's Vitals   06/24/20 1137  BP: (!) 144/87  [name]: 95  Temp: 98.3 F (36.8 C)  SpO2: 99%  Weight: 226 lb (102.5 kg)  Height: 5' 10"  (1.778 m)   Body mass index is 32.43 kg/m.   Physical Exam Vitals reviewed.  Constitutional:      Appearance: Normal appearance.  HENT:     Head: Normocephalic and atraumatic.  Eyes:     Conjunctiva/sclera: Conjunctivae normal.     Pupils: Pupils are equal, round, and reactive to light.  Cardiovascular:     Rate and Rhythm: Normal rate and regular rhythm.     Pulses: Normal pulses.     Heart sounds: Normal heart sounds. No murmur heard.  No friction rub. No gallop.   Pulmonary:     Effort: Pulmonary effort is normal. No respiratory distress.     Breath sounds: Normal breath sounds. No stridor. No wheezing or rales.  Abdominal:     General: Bowel sounds are normal.     Palpations: Abdomen is soft.     Tenderness: There is no abdominal tenderness.  Musculoskeletal:     Right lower leg: No edema.     Left lower leg: No edema.  Skin:    General: Skin is warm and dry.   Neurological:     General: No focal deficit present.     Mental Status: He is alert and oriented to person, place, and time.  Psychiatric:        Mood and Affect: Mood normal.        Behavior: Behavior normal.     Results for orders placed or performed in visit on 06/24/20 (from the past 24 hour(s))  Glucose (CBG)     Status: None  Collection Time: 06/24/20 12:03 PM  Result Value Ref Range   POC Glucose HHH 70 - 99 mg/dl  POCT glycosylated hemoglobin (Hb A1C)     Status: Abnormal   Collection Time: 06/24/20 12:15 PM  Result Value Ref Range   Hemoglobin A1C 9.8 (A) 4.0 - 5.6 %   HbA1c POC (<> result, manual entry)     HbA1c, POC (prediabetic range)     HbA1c, POC (controlled diabetic range)      No results found.   ASSESSMENT and PLAN  Problem List Items Addressed This Visit      Endocrine   Diabetes ([place]) - Primary   Relevant Medications   Dulaglutide (TRULICITY) 6.43 IP/7.7PZ SOPN for 1 month weekly   Dulaglutide (TRULICITY) 1.5 PS/8.8AY SOPN (Start on 07/25/2020)   metFORMIN (GLUCOPHAGE) 1000 MG tablet BID   Other Relevant Orders   Ambulatory referral to Ophthalmology   POCT glycosylated hemoglobin (Hb A1C) (Completed) 9.8   Glucose (CBG) (Completed) greater than 500  Did not show signs of DKA Strict RTC or ED precautions provided including: 1. Your blood glucose monitor reads "high" even when you are taking insulin. 2. You faint. 3. You have chest pain. 4. You have trouble breathing. 5. You have sudden trouble speaking or swallowing. 6. You have vomiting or diarrhea that gets worse after 3 hours. 7. You are unable to stay awake. 8. You have trouble thinking. 9. You are severely dehydrated. Symptoms of severe dehydration include: ? Extreme thirst. ? Dry mouth. ? Rapid breathing. Educated on Diabetic Diet: didn't want a referral at this time: Information provided.       Return in about 4 weeks (around 07/22/2020).   Alejandro Foley Raley Novicki, FNP-BC Primary Care at  [place], [place] [address] Ph.  [phone] Fax [phone]  I have reviewed and agree with above documentation. Agustina Caroli, MD

## 2020-06-24 NOTE — Telephone Encounter (Signed)
Pharmacy called and is requesting to know what strength of trulicity the pt should be receiving as there were two separate prescriptions sent in for it. Please advise.      [facility] ([address] [place]  [address] [place]) [place] [address]  Phone: [phone] Fax: [phone]  Hours: Not open 24 hours

## 2020-06-24 NOTE — Patient Instructions (Signed)

## 2020-06-26 NOTE — Telephone Encounter (Signed)
Pt had straitened  this out at pick up

## 2020-07-06 ENCOUNTER — Other Ambulatory Visit: Payer: Self-pay

## 2020-07-06 ENCOUNTER — Encounter: Payer: Self-pay | Admitting: Family Medicine

## 2020-07-06 ENCOUNTER — Ambulatory Visit: Payer: BC Managed Care – PPO | Admitting: Family Medicine

## 2020-07-06 VITALS — BP 145/89 | HR 84 | Temp 83.0°F | Ht 70.0 in | Wt 228.0 lb

## 2020-07-06 DIAGNOSIS — I10 Essential (primary) hypertension: Secondary | ICD-10-CM | POA: Diagnosis not present

## 2020-07-06 DIAGNOSIS — Z125 Encounter for screening for malignant neoplasm of prostate: Secondary | ICD-10-CM | POA: Diagnosis not present

## 2020-07-06 DIAGNOSIS — E119 Type 2 diabetes mellitus without complications: Secondary | ICD-10-CM | POA: Diagnosis not present

## 2020-07-06 MED ORDER — AMLODIPINE BESYLATE 10 MG PO TABS: 10.0000 mg | ORAL_TABLET | Freq: Every day | ORAL | 3 refills | Status: DC

## 2020-07-06 NOTE — Patient Instructions (Addendum)
Amlodipine: take 2 5 mg tabs for a total of 10mg  Your refills will be 10 mg tabs  Needs to schedule eye exam    Diabetes Mellitus and Nutrition, Adult When you have diabetes (diabetes mellitus), it is very important to have healthy eating habits because your blood sugar (glucose) levels are greatly affected by what you eat and drink. Eating healthy foods in the appropriate amounts, at about the same times every day, can help you:  Control your blood glucose.  Lower your risk of heart disease.  Improve your blood pressure.  Reach or maintain a healthy weight. Every person with diabetes is different, and each person has different needs for a meal plan. Your health care provider may recommend that you work with a diet and nutrition specialist (dietitian) to make a meal plan that is best for you. Your meal plan may vary depending on factors such as:  The calories you need.  The medicines you take.  Your weight.  Your blood glucose, blood pressure, and cholesterol levels.  Your activity level.  Other health conditions you have, such as heart or kidney disease. How do carbohydrates affect me? Carbohydrates, also called carbs, affect your blood glucose level more than any other type of food. Eating carbs naturally raises the amount of glucose in your blood. Carb counting is a method for keeping track of how many carbs you eat. Counting carbs is important to keep your blood glucose at a healthy level, especially if you use insulin or take certain oral diabetes medicines. It is important to know how many carbs you can safely have in each meal. This is different for every person. Your dietitian can help you calculate how many carbs you should have at each meal and for each snack. Foods that contain carbs include:  Bread, cereal, rice, pasta, and crackers.  Potatoes and corn.  Peas, beans, and lentils.  Milk and yogurt.  Fruit and juice.  Desserts, such as cakes, cookies, ice cream,  and candy. How does alcohol affect me? Alcohol can cause a sudden decrease in blood glucose (hypoglycemia), especially if you use insulin or take certain oral diabetes medicines. Hypoglycemia can be a life-threatening condition. Symptoms of hypoglycemia (sleepiness, dizziness, and confusion) are similar to symptoms of having too much alcohol. If your health care provider says that alcohol is safe for you, follow these guidelines:  Limit alcohol intake to no more than 1 drink per day for nonpregnant women and 2 drinks per day for men. One drink equals 12 oz of beer, 5 oz of wine, or 1 oz of hard liquor.  Do not drink on an empty stomach.  Keep yourself hydrated with water, diet soda, or unsweetened iced tea.  Keep in mind that regular soda, juice, and other mixers may contain a lot of sugar and must be counted as carbs. What are tips for following this plan?  Reading food labels  Start by checking the serving size on the "Nutrition Facts" label of packaged foods and drinks. The amount of calories, carbs, fats, and other nutrients listed on the label is based on one serving of the item. Many items contain more than one serving per package.  Check the total grams (g) of carbs in one serving. You can calculate the number of servings of carbs in one serving by dividing the total carbs by 15. For example, if a food has 30 g of total carbs, it would be equal to 2 servings of carbs.  Check the number  of grams (g) of saturated and trans fats in one serving. Choose foods that have low or no amount of these fats.  Check the number of milligrams (mg) of salt (sodium) in one serving. Most people should limit total sodium intake to less than 2,300 mg per day.  Always check the nutrition information of foods labeled as "low-fat" or "nonfat". These foods may be higher in added sugar or refined carbs and should be avoided.  Talk to your dietitian to identify your daily goals for nutrients listed on the  label. Shopping  Avoid buying canned, premade, or processed foods. These foods tend to be high in fat, sodium, and added sugar.  Shop around the outside edge of the grocery store. This includes fresh fruits and vegetables, bulk grains, fresh meats, and fresh dairy. Cooking  Use low-heat cooking methods, such as baking, instead of high-heat cooking methods like deep frying.  Cook using healthy oils, such as olive, canola, or sunflower oil.  Avoid cooking with butter, cream, or high-fat meats. Meal planning  Eat meals and snacks regularly, preferably at the same times every day. Avoid going long periods of time without eating.  Eat foods high in fiber, such as fresh fruits, vegetables, beans, and whole grains. Talk to your dietitian about how many servings of carbs you can eat at each meal.  Eat 4-6 ounces (oz) of lean protein each day, such as lean meat, chicken, fish, eggs, or tofu. One oz of lean protein is equal to: ? 1 oz of meat, chicken, or fish. ? 1 egg. ?  cup of tofu.  Eat some foods each day that contain healthy fats, such as avocado, nuts, seeds, and fish. Lifestyle  Check your blood glucose regularly.  Exercise regularly as told by your health care provider. This may include: ? 150 minutes of moderate-intensity or vigorous-intensity exercise each week. This could be brisk walking, biking, or water aerobics. ? Stretching and doing strength exercises, such as yoga or weightlifting, at least 2 times a week.  Take medicines as told by your health care provider.  Do not use any products that contain nicotine or tobacco, such as cigarettes and e-cigarettes. If you need help quitting, ask your health care provider.  Work with a Social worker or diabetes educator to identify strategies to manage stress and any emotional and social challenges. Questions to ask a health care provider  Do I need to meet with a diabetes educator?  Do I need to meet with a dietitian?  What  number can I call if I have questions?  When are the best times to check my blood glucose? Where to find more information:  American Diabetes Association: diabetes.org  Academy of Nutrition and Dietetics: www.eatright.[employer] of Diabetes and Digestive and Kidney Diseases (NIH): [employer] Summary  A healthy meal plan will help you control your blood glucose and maintain a healthy lifestyle.  Working with a diet and nutrition specialist (dietitian) can help you make a meal plan that is best for you.  Keep in mind that carbohydrates (carbs) and alcohol have immediate effects on your blood glucose levels. It is important to count carbs and to use alcohol carefully. This information is not intended to replace advice given to you by your health care provider. Make sure you discuss any questions you have with your health care provider. Document Revised: 06/23/2017 Document Reviewed: 08/15/2016 Elsevier Patient Education  [employer].   If you have lab work done today you will be  contacted with your lab results within the next 2 weeks.  If you have not heard from [place] then please contact us. The fastest way to get your results is to register for My Chart.   IF you received an x-ray today, you will receive an invoice from [facility]. Please contact [facility] at [phone] with questions or concerns regarding your invoice.   IF you received labwork today, you will receive an invoice from [facility]. Please contact [facility] at [phone] with questions or concerns regarding your invoice.   Our billing staff will not be able to assist you with questions regarding bills from these companies.  You will be contacted with the lab results as soon as they are available. The fastest way to get your results is to activate your My Chart account. Instructions are located on the last page of this paperwork. If you have not heard from [place] regarding the  results in 2 weeks, please contact this office.

## 2020-07-06 NOTE — Progress Notes (Addendum)
12/13/20213:56 PM  Alejandro [name] February 27, 1961, 59 y.o., male [id number]  Chief Complaint  Patient presents with  . Diabetes    Follow up     HPI:   Patient is a 59 y.o. male with past medical history significant for HTN, Hep C, DM who presents today for DM follow up.  DM Working on LFM BG range: 230-90 Checks BG twice a day Trulicity weekly .75 (Starts 1.42m dose 1/1) Metformin 10080mbid On statin  HTN Goal BP< 130/80 BP Readings from Last 3 Encounters:  07/06/20 (!) 145/89  06/24/20 (!) 144/87  06/22/20 (!) 151/93   HCTZ 2586mmlodipine 5mg60mab Results  Component Value Date   CHOL 127 04/30/2020   HDL 49 04/30/2020   LDLCALC 55 04/30/2020   TRIG 134 04/30/2020   CHOLHDL 2.6 04/30/2020   Screening Colonoscopy: 12/31/15 (due in 5 years (June 2022) PSA: today   Lab Results  Component Value Date   HGBA1C 9.8 (A) 06/24/2020   HGBA1C 7.0 (H) 04/30/2020   HGBA1C 6.9 (H) 10/30/2019   Lab Results  Component Value Date   MICROALBUR <0.2 08/26/2015   LDLCALC 55 04/30/2020   CREATININE 1.06 06/22/2020   Wt Readings from Last 3 Encounters:  07/06/20 228 lb (103.4 kg)  06/24/20 226 lb (102.5 kg)  05/19/20 228 lb (103.4 kg)    Depression screen PHQ [facility] 07/06/2020 06/24/2020 05/19/2020  Decreased Interest 0 0 0  Down, Depressed, Hopeless 0 0 0  PHQ - 2 Score 0 0 0    Fall Risk  07/06/2020 06/24/2020 05/19/2020 04/30/2020 10/30/2019  Falls in the past year? 0 0 0 0 0  Number falls in past yr: 0 0 0 0 0  Injury with Fall? 0 0 0 0 0  Risk for fall due to : - - - No Fall Risks -  Follow up Falls evaluation completed Falls evaluation completed - Falls evaluation completed Falls evaluation completed     Allergies  Allergen Reactions  . Tylenol [Acetaminophen] Hypertension    Prior to Admission medications   Medication Sig Start Date End Date Taking? Authorizing Provider  amLODipine (NORVASC) 5 MG tablet Take 1 tablet by mouth once daily 04/05/20  Yes  SantRutherford Guys  atorvastatin (LIPITOR) 20 MG tablet Take 1 tablet by mouth once daily 04/05/20  Yes SantRutherford Guys  Blood Glucose Monitoring Suppl (ONE TOUCH ULTRA 2) w/Device KIT USE AS DIRECTED TO TEST BLOOD SUGAR IN THE MORNING 08/07/16  Yes English, Stephanie D, PA  Blood Glucose Monitoring Suppl KIT 1 application by Does not apply route every morning. 04/30/20  Yes SantRutherford Guys  Blood Pressure Monitoring (BLOOD PRESSURE CUFF) MISC Use as directed 07/07/17  Yes ShawShawnee Knapp  Capsaicin-Menthol-Methyl Sal (CAPSAICIN-METHYL SAL-MENTHOL) 0.025-1-12 % CREA Apply 1 application topically 3 (three) times daily as needed (Leg pain). 05/19/20  Yes Latavia Goga, KelsLaurita QuintP  fluticasone (FLONASE) 50 MCG/ACT nasal spray USE 1 TO 2 SPRAY(S) IN EACH NOSTRIL ONCE DAILY 03/05/20  Yes SantRutherford Guys  gabapentin (NEURONTIN) 400 MG capsule Take 1 capsule (400 mg total) by mouth 3 (three) times daily. 05/19/20  Yes Jasmon Graffam, KelsLaurita QuintP  glucose blood (ONETOUCH VERIO) test strip USE ONE STRIP TO CHECK GLUCOSE TWICE DAILY 04/23/19  Yes SantRutherford Guys  hydrochlorothiazide (HYDRODIURIL) 25 MG tablet Take 1 tablet (25 mg total) by mouth daily. 04/30/20  Yes SantRutherford Guys  hydrocortisone (ANUSOL-HC) 25 MG suppository  Place 1 suppository (25 mg total) rectally 2 (two) times daily. 10/30/19  Yes Myles Lipps, MD  Lidocaine (HM LIDOCAINE PATCH) 4 % PTCH Apply 1 patch topically daily. Apply at night, leave on for 12 hours, remove in the morning issues 05/19/20  Yes Misti Towle, Azalee Course, FNP  metFORMIN (GLUCOPHAGE-XR) 750 MG 24 hr tablet TAKE 1 TABLET BY MOUTH ONCE DAILY IN THE MORNING WITH BREAKFAST 04/30/20  Yes Myles Lipps, MD    Past Medical History:  Diagnosis Date  . Diabetes mellitus without complication ([facility])   . Fatty liver   . Hepatitis C   . Hypertension   . Tubular adenoma of colon 12/2015    Past Surgical History:  Procedure Laterality Date  . COLONOSCOPY      Social  History   Tobacco Use  . Smoking status: Former Games developer  . Smokeless tobacco: Never Used  Substance Use Topics  . Alcohol use: Yes    Comment: occasional wine    Family History  Problem Relation Age of Onset  . Heart disease Mother 55       CAD/AMI  . Diabetes Mother   . Hypertension Mother   . Diabetes Maternal Grandfather   . Diabetes Paternal Grandfather   . Diabetes Father   . Diabetes Sister   . Hypertension Sister   . Hypertension Brother   . Diabetes Brother   . Colon cancer Neg Hx   . Esophageal cancer Neg Hx   . Stomach cancer Neg Hx   . Rectal cancer Neg Hx     Review of Systems  Constitutional: Negative for chills, fever and malaise/fatigue.  Eyes: Negative for blurred vision and double vision.  Respiratory: Negative for cough, shortness of breath and wheezing.   Cardiovascular: Negative for chest pain, palpitations and leg swelling.  Gastrointestinal: Negative for abdominal pain, blood in stool, constipation, diarrhea, heartburn, nausea and vomiting.  Genitourinary: Negative for dysuria, frequency and hematuria.  Musculoskeletal: Negative for back pain and joint pain.  Skin: Negative for rash.  Neurological: Negative for dizziness, weakness and headaches.     OBJECTIVE:  Today's Vitals   07/06/20 1519  BP: (!) 145/89  Pulse: 84  Temp: (!) 83 F (28.3 C)  SpO2: 100%  Weight: 228 lb (103.4 kg)  Height: 5\' 10"  (1.778 m)   Body mass index is 32.71 kg/m.   Physical Exam Vitals reviewed.  Constitutional:      Appearance: Normal appearance.  HENT:     Head: Normocephalic and atraumatic.  Eyes:     Conjunctiva/sclera: Conjunctivae normal.     Pupils: Pupils are equal, round, and reactive to light.  Cardiovascular:     Rate and Rhythm: Normal rate and regular rhythm.     Pulses: Normal pulses.     Heart sounds: Normal heart sounds. No murmur heard. No friction rub. No gallop.   Pulmonary:     Effort: Pulmonary effort is normal. No respiratory  distress.     Breath sounds: Normal breath sounds. No stridor. No wheezing or rales.  Abdominal:     General: Bowel sounds are normal.     Palpations: Abdomen is soft.     Tenderness: There is no abdominal tenderness.  Musculoskeletal:     Right lower leg: No edema.     Left lower leg: No edema.  Skin:    General: Skin is warm and dry.  Neurological:     General: No focal deficit present.     Mental Status: He  is alert and oriented to person, place, and time.  Psychiatric:        Mood and Affect: Mood normal.        Behavior: Behavior normal.     No results found for this or any previous visit (from the past 24 hour(s)).  No results found.   ASSESSMENT and PLAN  Problem List Items Addressed This Visit      Cardiovascular and Mediastinum   Essential hypertension - Primary   Relevant Medications   amLODipine (NORVASC) 10 MG tablet Increased from 80m to 159mBP not at goal < 130/80 Will follow up next visit Continue daily HCTZ 2566m   Endocrine   Diabetes ([place] Relevant Orders   Microalbumin/Creatinine Ratio, Urine   Amb ref to Medical Nutrition Therapy-MNT Continue current regimen Home BG at goal Continuing to work on LFM Discussed RTC precautions    Other Visit Diagnoses    Screening for prostate cancer       Relevant Orders   PSA Will follow up with lab results         Return for next scheduled visits.   KelHuston Foleyst, FNP-BC [facility] at [facility] [phone].  [phone] have reviewed and agree with above documentation. MigAgustina CaroliD

## 2020-07-07 LAB — MICROALBUMIN / CREATININE URINE RATIO
Creatinine, Urine: 38.7 mg/dL
Microalb/Creat Ratio: <8 mg/g creat (ref 0–29)
Microalbumin, Urine: <3 ug/mL

## 2020-07-07 LAB — PSA: Prostate Specific Ag, Serum: 0.4 ng/mL (ref 0.0–4.0)

## 2020-07-07 NOTE — Progress Notes (Signed)
If you could let Alejandro [name] know his PSA is within normal limits.

## 2020-07-22 ENCOUNTER — Ambulatory Visit: Payer: BC Managed Care – PPO | Admitting: Family Medicine

## 2020-07-22 ENCOUNTER — Encounter: Payer: Self-pay | Admitting: Family Medicine

## 2020-07-22 ENCOUNTER — Other Ambulatory Visit: Payer: Self-pay

## 2020-07-22 VITALS — BP 124/84 | HR 82 | Temp 98.3°F | Ht 70.0 in | Wt 227.0 lb

## 2020-07-22 DIAGNOSIS — E119 Type 2 diabetes mellitus without complications: Secondary | ICD-10-CM | POA: Diagnosis not present

## 2020-07-22 DIAGNOSIS — I10 Essential (primary) hypertension: Secondary | ICD-10-CM | POA: Diagnosis not present

## 2020-07-22 MED ORDER — ATORVASTATIN CALCIUM 40 MG PO TABS: 40.0000 mg | ORAL_TABLET | Freq: Every day | ORAL | 2 refills | Status: DC

## 2020-07-22 MED ORDER — VALSARTAN 80 MG PO TABS: 80.0000 mg | ORAL_TABLET | Freq: Every day | ORAL | 3 refills | Status: DC

## 2020-07-22 NOTE — Patient Instructions (Addendum)
Amlodipine: 10mg  Atorvastatin: 40mg  Start Valsartan: 80mg  tab  Next appointment bring medications and glucometer      If you have lab work done today you will be contacted with your lab results within the next 2 weeks.  If you have not heard from then please contact . The fastest way to get your results is to register for My Chart.   IF you received an x-ray today, you will receive an invoice from [facility]. Please contact [facility] at [phone] with questions or concerns regarding your invoice.   IF you received labwork today, you will receive an invoice from [facility]. Please contact [facility] at [phone] with questions or concerns regarding your invoice.   Our billing staff will not be able to assist you with questions regarding bills from these companies.  You will be contacted with the lab results as soon as they are available. The fastest way to get your results is to activate your My Chart account. Instructions are located on the last page of this paperwork. If you have not heard from [phone] regarding the results in 2  Health Maintenance, Male Adopting a healthy lifestyle and getting preventive care are important in promoting health and wellness. Ask your health care provider about:  The right schedule for you to have regular tests and exams.  Things you can do on your own to prevent diseases and keep yourself healthy. What should I know about diet, weight, and exercise? Eat a healthy diet   Eat a diet that includes plenty of vegetables, fruits, low-fat dairy products, and lean protein.  Do not eat a lot of foods that are high in solid fats, added sugars, or sodium. Maintain a healthy weight Body mass index (BMI) is a measurement that can be used to identify possible weight problems. It estimates body fat based on height and weight. Your health care provider can help determine your BMI and help you achieve or maintain a healthy weight. Get regular  exercise Get regular exercise. This is one of the most important things you can do for your health. Most adults should:  Exercise for at least 150 minutes each week. The exercise should increase your heart rate and make you sweat (moderate-intensity exercise).  Do strengthening exercises at least twice a week. This is in addition to the moderate-intensity exercise.  Spend less time sitting. Even light physical activity can be beneficial. Watch cholesterol and blood lipids Have your blood tested for lipids and cholesterol at 59 years of age, then have this test every 5 years. You may need to have your cholesterol levels checked more often if:  Your lipid or cholesterol levels are high.  You are older than 59 years of age.  You are at high risk for heart disease. What should I know about cancer screening? Many types of cancers can be detected early and may often be prevented. Depending on your health history and family history, you may need to have cancer screening at various ages. This may include screening for:  Colorectal cancer.  Prostate cancer.  Skin cancer.  Lung cancer. What should I know about heart disease, diabetes, and high blood pressure? Blood pressure and heart disease  High blood pressure causes heart disease and increases the risk of stroke. This is more likely to develop in people who have high blood pressure readings, are of African descent, or are overweight.  Talk with your health care provider about your target blood pressure readings.  Have your blood pressure checked: ?  Every 3-5 years if you are 63-92 years of age. ? Every year if you are 56 years old or older.  If you are between the ages of 16 and 70 and are a current or former smoker, ask your health care provider if you should have a one-time screening for abdominal aortic aneurysm (AAA). Diabetes Have regular diabetes screenings. This checks your fasting blood sugar level. Have the screening  done:  Once every three years after age 58 if you are at a normal weight and have a low risk for diabetes.  More often and at a younger age if you are overweight or have a high risk for diabetes. What should I know about preventing infection? Hepatitis B If you have a higher risk for hepatitis B, you should be screened for this virus. Talk with your health care provider to find out if you are at risk for hepatitis B infection. Hepatitis C Blood testing is recommended for:  Everyone born from [birth year] through 1965.  Anyone with known risk factors for hepatitis C. Sexually transmitted infections (STIs)  You should be screened each year for STIs, including gonorrhea and chlamydia, if: ? You are sexually active and are younger than 59 years of age. ? You are older than 59 years of age and your health care provider tells you that you are at risk for this type of infection. ? Your sexual activity has changed since you were last screened, and you are at increased risk for chlamydia or gonorrhea. Ask your health care provider if you are at risk.  Ask your health care provider about whether you are at high risk for HIV. Your health care provider may recommend a prescription medicine to help prevent HIV infection. If you choose to take medicine to prevent HIV, you should first get tested for HIV. You should then be tested every 3 months for as long as you are taking the medicine. Follow these instructions at home: Lifestyle  Do not use any products that contain nicotine or tobacco, such as cigarettes, e-cigarettes, and chewing tobacco. If you need help quitting, ask your health care provider.  Do not use street drugs.  Do not share needles.  Ask your health care provider for help if you need support or information about quitting drugs. Alcohol use  Do not drink alcohol if your health care provider tells you not to drink.  If you drink alcohol: ? Limit how much you have to 0-2 drinks a  day. ? Be aware of how much alcohol is in your drink. In the [place] one drink equals one 12 oz bottle of beer (355 mL), one 5 oz glass of wine (148 mL), or one 1 oz glass of hard liquor (44 mL). General instructions  Schedule regular health, dental, and eye exams.  Stay current with your vaccines.  Tell your health care provider if: ? You often feel depressed. ? You have ever been abused or do not feel safe at home. Summary  Adopting a healthy lifestyle and getting preventive care are important in promoting health and wellness.  Follow your health care provider's instructions about healthy diet, exercising, and getting tested or screened for diseases.  Follow your health care provider's instructions on monitoring your cholesterol and blood pressure. This information is not intended to replace advice given to you by your health care provider. Make sure you discuss any questions you have with your health care provider. Document Revised: 07/04/2018 Document Reviewed: 07/04/2018 Elsevier Patient Education  2020 Elsevier  Inc. weeks, please contact this office.

## 2020-07-22 NOTE — Progress Notes (Signed)
12/29/20214:01 PM  Alejandro [name] 10/17/1960, 59 y.o., male [id number]  Chief Complaint  Patient presents with  . Diabetes    Follow up , pt report some shortness of breath at times    HPI:   Patient is a 59 y.o. male with past medical history significant for HTN, Hep C, DM who presents today for DM and HTN follow up.  DM Working on LFM Didn't bring meter BG range: 190-98 Checks BG twice a day Trulicity weekly .75 (Starts 1.17m dose 1/1) Metformin 10063mbid On atorvastatin 2035maily Amb ref to Medical Nutrition Therapy-MNT last visit Continuing to work on LFM Needs eye exam  Lab Results  Component Value Date   HGBA1C 9.8 (A) 06/24/2020   Lab Results  Component Value Date   LABMICR <3.0 07/06/2020   LABMICR 11.1 04/23/2019   MICROALBUR <0.2 08/26/2015   MICROALBUR 0.3 09/16/2014    Lab Results  Component Value Date   CHOL 127 04/30/2020   HDL 49 04/30/2020   LDLCALC 55 04/30/2020   TRIG 134 04/30/2020   CHOLHDL 2.6 04/30/2020     HTN Goal BP< 130/80 HCTZ 37m57mlodipine from 5mg 73m10 mg last visit  BP Readings from Last 3 Encounters:  07/22/20 (!) 165/93  07/06/20 (!) 145/89  06/24/20 (!) 144/87     Screening Colonoscopy: 12/31/15 (due in 5 years (June 2022)    Depression screen [facility] 07/06/2020 06/24/2020  Decreased Interest 0 0 0  Down, Depressed, Hopeless 0 0 0  PHQ - 2 Score 0 0 0    Fall Risk  07/22/2020 07/06/2020 06/24/2020 05/19/2020 04/30/2020  Falls in the past year? 0 0 0 0 0  Number falls in past yr: 0 0 0 0 0  Injury with Fall? 0 0 0 0 0  Risk for fall due to : - - - - No Fall Risks  Follow up Falls evaluation completed Falls evaluation completed Falls evaluation completed - Falls evaluation completed     Allergies  Allergen Reactions  . Tylenol [Acetaminophen] Hypertension    Prior to Admission medications   Medication Sig Start Date End Date Taking? Authorizing Provider  amLODipine (NORVASC) 5 MG  tablet Take 1 tablet by mouth once daily 04/05/20  Yes SantiRutherford Guys atorvastatin (LIPITOR) 20 MG tablet Take 1 tablet by mouth once daily 04/05/20  Yes SantiRutherford Guys Blood Glucose Monitoring Suppl (ONE TOUCH ULTRA 2) w/Device KIT USE AS DIRECTED TO TEST BLOOD SUGAR IN THE MORNING 08/07/16  Yes English, Stephanie D, PA  Blood Glucose Monitoring Suppl KIT 1 application by Does not apply route every morning. 04/30/20  Yes SantiRutherford Guys Blood Pressure Monitoring (BLOOD PRESSURE CUFF) MISC Use as directed 07/07/17  Yes Shaw,Shawnee Knapp Capsaicin-Menthol-Methyl Sal (CAPSAICIN-METHYL SAL-MENTHOL) 0.025-1-12 % CREA Apply 1 application topically 3 (three) times daily as needed (Leg pain). 05/19/20  Yes Ennifer Harston, KelseLaurita Quint  fluticasone (FLONASE) 50 MCG/ACT nasal spray USE 1 TO 2 SPRAY(S) IN EACH NOSTRIL ONCE DAILY 03/05/20  Yes SantiRutherford Guys gabapentin (NEURONTIN) 400 MG capsule Take 1 capsule (400 mg total) by mouth 3 (three) times daily. 05/19/20  Yes Evangelyn Crouse, KelseLaurita Quint  glucose blood (ONETOUCH VERIO) test strip USE ONE STRIP TO CHECK GLUCOSE TWICE DAILY 04/23/19  Yes SantiRutherford Guys hydrochlorothiazide (HYDRODIURIL) 25 MG tablet Take 1 tablet (25 mg total) by mouth daily. 04/30/20  Yes SantiGrant Fontana  MD  hydrocortisone (ANUSOL-HC) 25 MG suppository Place 1 suppository (25 mg total) rectally 2 (two) times daily. 10/30/19  Yes Rutherford Guys, MD  Lidocaine (HM LIDOCAINE PATCH) 4 % PTCH Apply 1 patch topically daily. Apply at night, leave on for 12 hours, remove in the morning issues 05/19/20  Yes Anaisha Mago, Laurita Quint, FNP  metFORMIN (GLUCOPHAGE-XR) 750 MG 24 hr tablet TAKE 1 TABLET BY MOUTH ONCE DAILY IN THE MORNING WITH BREAKFAST 04/30/20  Yes Rutherford Guys, MD    Past Medical History:  Diagnosis Date  . Diabetes mellitus without complication ([place])   . Fatty liver   . Hepatitis C   . Hypertension   . Tubular adenoma of colon 12/2015    Past Surgical History:   Procedure Laterality Date  . COLONOSCOPY      Social History   Tobacco Use  . Smoking status: Former Research scientist (life sciences)  . Smokeless tobacco: Never Used  Substance Use Topics  . Alcohol use: Yes    Comment: occasional wine    Family History  Problem Relation Age of Onset  . Heart disease Mother 55       CAD/AMI  . Diabetes Mother   . Hypertension Mother   . Diabetes Maternal Grandfather   . Diabetes Paternal Grandfather   . Diabetes Father   . Diabetes Sister   . Hypertension Sister   . Hypertension Brother   . Diabetes Brother   . Colon cancer Neg Hx   . Esophageal cancer Neg Hx   . Stomach cancer Neg Hx   . Rectal cancer Neg Hx     Review of Systems  Constitutional: Negative for chills, fever and malaise/fatigue.  Eyes: Negative for blurred vision and double vision.  Respiratory: Negative for cough, shortness of breath and wheezing.   Cardiovascular: Negative for chest pain, palpitations and leg swelling.  Gastrointestinal: Negative for abdominal pain, blood in stool, constipation, diarrhea, heartburn, nausea and vomiting.  Genitourinary: Negative for dysuria, frequency and hematuria.  Musculoskeletal: Negative for back pain and joint pain.  Skin: Negative for rash.  Neurological: Negative for dizziness, weakness and headaches.     OBJECTIVE:  Today's Vitals   07/22/20 1537  BP: (!) 165/93  [name]: 82  Temp: 98.3 F (36.8 C)  SpO2: 98%  Weight: 227 lb (103 kg)  Height: 5' 10"  (1.778 m)   Body mass index is 32.57 kg/m.   Physical Exam Vitals reviewed.  Constitutional:      Appearance: Normal appearance.  Cardiovascular:     Rate and Rhythm: Normal rate and regular rhythm.     Pulses: Normal pulses.     Heart sounds: Normal heart sounds. No murmur heard. No friction rub. No gallop.   Pulmonary:     Effort: Pulmonary effort is normal. No respiratory distress.     Breath sounds: Normal breath sounds. No stridor. No wheezing or rales.  Abdominal:      General: Bowel sounds are normal.     Palpations: Abdomen is soft.     Tenderness: There is no abdominal tenderness.  Musculoskeletal:     Right lower leg: No edema.     Left lower leg: No edema.  Skin:    General: Skin is warm and dry.  Neurological:     General: No focal deficit present.     Mental Status: He is alert and oriented to person, place, and time.  Psychiatric:        Mood and Affect: Mood normal.  Behavior: Behavior normal.     No results found for this or any previous visit (from the past 24 hour(s)).  No results found.   ASSESSMENT and PLAN Problem List Items Addressed This Visit      Cardiovascular and Mediastinum   Essential hypertension - Primary   Relevant Medications   valsartan (DIOVAN) 80 MG tablet   atorvastatin (LIPITOR) 40 MG tablet  BP not at goal< 130/80 BP: (!) 165/93   Amlodipine at 31m max and HCTZ at 271mmax Adding Valsartan at 8061m   Endocrine   Diabetes (HCC)   Relevant Medications   valsartan (DIOVAN) 80 MG tablet   atorvastatin (LIPITOR) 40 MG tablet   Other Relevant Orders   Ambulatory referral to Ophthalmology  LDL not at goal< 80 Increasing Atorvastatin to 40 mg from 39m35mr Patient home BG at goal  Will recheck a1c in Jan appt Lab Results  Component Value Date   HGBA1C 9.8 (A) 06/24/2020          Return in about 4 weeks (around 08/19/2020).   KelsHuston Foleyt, FNP-BC [facility] [phone]  [phone]

## 2020-08-03 ENCOUNTER — Ambulatory Visit: Payer: BC Managed Care – PPO | Admitting: Family Medicine

## 2020-08-13 ENCOUNTER — Ambulatory Visit (HOSPITAL_COMMUNITY)
Admission: RE | Admit: 2020-08-13 | Discharge: 2020-08-13 | Disposition: A | Discharge status: Home / Self Care | Payer: Self-pay | Source: Ambulatory Visit | Attending: Vascular Surgery | Admitting: Vascular Surgery

## 2020-08-13 ENCOUNTER — Ambulatory Visit (INDEPENDENT_AMBULATORY_CARE_PROVIDER_SITE_OTHER): Payer: BC Managed Care – PPO | Admitting: Vascular Surgery

## 2020-08-13 ENCOUNTER — Encounter: Payer: Self-pay | Admitting: Vascular Surgery

## 2020-08-13 ENCOUNTER — Other Ambulatory Visit: Payer: Self-pay

## 2020-08-13 VITALS — BP 141/87 | HR 86 | Temp 98.4°F | Resp 16 | Ht 70.0 in | Wt 228.0 lb

## 2020-08-13 DIAGNOSIS — I872 Venous insufficiency (chronic) (peripheral): Secondary | ICD-10-CM

## 2020-08-13 NOTE — Progress Notes (Signed)
REASON FOR VISIT:   Follow-up of chronic venous insufficiency  MEDICAL ISSUES:   CHRONIC VENOUS INSUFFICIENCY: This patient has CEAP C4a venous disease.  His symptoms have improved significantly with elevation and compression.  I encouraged him to continue to elevate his legs daily.  He will continue to wear his compression stockings especially when he is on his feet.  We have again discussed the importance of exercise specifically walking and water aerobics.  He should avoid prolonged sitting and standing.  He should keep the skin well lubricated especially in the winter.  Given his fairly advanced venous disease I would like to repeat his venous reflux testing in 1 year and I will see him back at that time.  He knows to call sooner if he has problems.  He is concerned about developing peripheral vascular disease given his history of diabetes.  With respect to this he does smoke an occasional cigarette and I encouraged him to get off this completely.  We discussed the importance of exercise, keeping his blood pressure well controlled, and keeping his diabetes under good control.    HPI:   Alejandro [name] is a pleasant 60 y.o. male who I saw on 05/06/2020 for evaluation of peripheral vascular disease.  He was complaining of leg pain.  Given his risk factors for peripheral vascular disease we he was sent for vascular consultation.  On my history he describes aching pain and heaviness in his left leg which is aggravated by sitting and standing and relieved with elevation.  He works on his feet all day for long hours.  On physical exam he had hyperpigmentation bilaterally consistent with chronic venous insufficiency.  His noninvasive arterial studies showed normal arterial flow bilaterally.  He had evidence of CEAP C4a venous disease.  We discussed conservative measures including leg elevation, thigh-high compression stockings with a gradient of 20-30, exercise, and the need to avoid prolonged  sitting and standing.  I also Encouraged him to keep the skin well lubricated.  I looked at his superficial system myself and the great saphenous vein was not especially dilated.  I suspect that he has significant deep venous reflux.  Since I saw him last he states that his legs feel much better since he has been wearing compression stockings and elevating his legs.  I do not get any history of claudication or rest pain.  He has had no venous ulcers.  He is also been trying to keep the skin well lubricated.    Past Medical History:  Diagnosis Date  . Diabetes mellitus without complication ([place])   . Fatty liver   . Hepatitis C   . Hypertension   . Tubular adenoma of colon 12/2015    Family History  Problem Relation Age of Onset  . Heart disease Mother 16       CAD/AMI  . Diabetes Mother   . Hypertension Mother   . Diabetes Maternal Grandfather   . Diabetes Paternal Grandfather   . Diabetes Father   . Diabetes Sister   . Hypertension Sister   . Hypertension Brother   . Diabetes Brother   . Colon cancer Neg Hx   . Esophageal cancer Neg Hx   . Stomach cancer Neg Hx   . Rectal cancer Neg Hx     SOCIAL HISTORY: Social History   Tobacco Use  . Smoking status: Former Research scientist (life sciences)  . Smokeless tobacco: Never Used  Substance Use Topics  . Alcohol use: Yes  Comment: occasional wine    Allergies  Allergen Reactions  . Tylenol [Acetaminophen] Hypertension    Current Outpatient Medications  Medication Sig Dispense Refill  . atorvastatin (LIPITOR) 40 MG tablet Take 1 tablet (40 mg total) by mouth daily. 90 tablet 2  . Blood Glucose Monitoring Suppl (ONE TOUCH ULTRA 2) w/Device KIT USE AS DIRECTED TO TEST BLOOD SUGAR IN THE MORNING 1 each 0  . Blood Glucose Monitoring Suppl KIT 1 application by Does not apply route every morning. 1 kit 3  . Blood Pressure Monitoring (BLOOD PRESSURE CUFF) MISC Use as directed 1 each 0  . Dulaglutide (TRULICITY) 0.96 EA/5.4UJ SOPN Inject 0.75 mg  into the skin once a week. 0.5 mL 4  . fluticasone (FLONASE) 50 MCG/ACT nasal spray USE 1 TO 2 SPRAY(S) IN EACH NOSTRIL ONCE DAILY 16 g 0  . gabapentin (NEURONTIN) 400 MG capsule Take 1 capsule (400 mg total) by mouth 3 (three) times daily. 90 capsule 3  . glucose blood (ONETOUCH VERIO) test strip USE ONE STRIP TO CHECK GLUCOSE TWICE DAILY 150 each 6  . metFORMIN (GLUCOPHAGE) 1000 MG tablet Take 1 tablet (1,000 mg total) by mouth 2 (two) times daily with a meal. 90 tablet 6  . valsartan (DIOVAN) 80 MG tablet Take 1 tablet (80 mg total) by mouth daily. 90 tablet 3  . amLODipine (NORVASC) 10 MG tablet Take 1 tablet (10 mg total) by mouth daily. 90 tablet 3  . Dulaglutide (TRULICITY) 1.5 WJ/1.9JY SOPN Inject 1.5 mg into the skin once a week. (Patient not taking: Reported on 08/13/2020) 0.5 mL 10  . hydrochlorothiazide (HYDRODIURIL) 25 MG tablet Take 1 tablet (25 mg total) by mouth daily. 90 tablet 1   No current facility-administered medications for this visit.    REVIEW OF SYSTEMS:  [X]  denotes positive finding, [ ]  denotes negative finding Cardiac  Comments:  Chest pain or chest pressure:    Shortness of breath upon exertion:    Short of breath when lying flat:    Irregular heart rhythm:        Vascular    Pain in calf, thigh, or hip brought on by ambulation:    Pain in feet at night that wakes you up from your sleep:     Blood clot in your veins:    Leg swelling:  x       Pulmonary    Oxygen at home:    Productive cough:     Wheezing:         Neurologic    Sudden weakness in arms or legs:     Sudden numbness in arms or legs:     Sudden onset of difficulty speaking or slurred speech:    Temporary loss of vision in one eye:     Problems with dizziness:         Gastrointestinal    Blood in stool:     Vomited blood:         Genitourinary    Burning when urinating:     Blood in urine:        Psychiatric    Major depression:         Hematologic    Bleeding problems:     Problems with blood clotting too easily:        Skin    Rashes or ulcers:        Constitutional    Fever or chills:     PHYSICAL EXAM:   Vitals:  08/13/20 1538  BP: (!) 141/87  Pulse: 86  Resp: 16  Temp: 98.4 F (36.9 C)  TempSrc: Temporal  SpO2: 98%  Weight: 228 lb (103.4 kg)  Height: 5' 10"  (1.778 m)    GENERAL: The patient is a well-nourished male, in no acute distress. The vital signs are documented above. CARDIAC: There is a regular rate and rhythm.  VASCULAR: I do not detect carotid bruits. He has palpable femoral and dorsalis pedis pulses bilaterally. He has hyperpigmentation bilaterally with mild induration.  He has no venous ulcers. PULMONARY: There is good air exchange bilaterally without wheezing or rales. ABDOMEN: Soft and non-tender with normal pitched bowel sounds.  MUSCULOSKELETAL: There are no major deformities or cyanosis. NEUROLOGIC: No focal weakness or paresthesias are detected. SKIN: There are no ulcers or rashes noted. PSYCHIATRIC: The patient has a normal affect.  DATA:    VENOUS DUPLEX: I have independently interpreted his venous duplex scan today.  This was of the left lower extremity only.  There was no evidence of DVT or superficial venous thrombosis.  There was no deep venous reflux on the left.  He only had a short segment of superficial venous reflux in the left great saphenous vein around the knee but the vein was not dilated.  There was one incompetent perforator in the mid calf.  Deitra Mayo [facility] [phone]

## 2020-08-19 ENCOUNTER — Encounter: Payer: Self-pay | Admitting: Family Medicine

## 2020-08-19 ENCOUNTER — Other Ambulatory Visit: Payer: Self-pay

## 2020-08-19 ENCOUNTER — Ambulatory Visit (INDEPENDENT_AMBULATORY_CARE_PROVIDER_SITE_OTHER): Payer: BC Managed Care – PPO | Admitting: Family Medicine

## 2020-08-19 VITALS — BP 179/102 | HR 87 | Temp 98.4°F | Ht 70.0 in | Wt 232.0 lb

## 2020-08-19 DIAGNOSIS — I739 Peripheral vascular disease, unspecified: Secondary | ICD-10-CM | POA: Insufficient documentation

## 2020-08-19 DIAGNOSIS — E1165 Type 2 diabetes mellitus with hyperglycemia: Secondary | ICD-10-CM

## 2020-08-19 DIAGNOSIS — I872 Venous insufficiency (chronic) (peripheral): Secondary | ICD-10-CM

## 2020-08-19 DIAGNOSIS — B182 Chronic viral hepatitis C: Secondary | ICD-10-CM | POA: Diagnosis not present

## 2020-08-19 DIAGNOSIS — I10 Essential (primary) hypertension: Secondary | ICD-10-CM

## 2020-08-19 DIAGNOSIS — E119 Type 2 diabetes mellitus without complications: Secondary | ICD-10-CM

## 2020-08-19 LAB — GLUCOSE, POCT (MANUAL RESULT ENTRY): POC Glucose: 102 mg/dl — AB (ref 70–99)

## 2020-08-19 LAB — POCT GLYCOSYLATED HEMOGLOBIN (HGB A1C): Hemoglobin A1C: 8.3 % — AB (ref 4.0–5.6)

## 2020-08-19 MED ORDER — GABAPENTIN 400 MG PO CAPS
400.0000 mg | ORAL_CAPSULE | Freq: Three times a day (TID) | ORAL | 3 refills | Status: AC
Start: 2020-08-19 — End: ?

## 2020-08-19 MED ORDER — ATORVASTATIN CALCIUM 40 MG PO TABS: 40.0000 mg | ORAL_TABLET | Freq: Every day | ORAL | 2 refills | Status: AC

## 2020-08-19 MED ORDER — AMLODIPINE BESYLATE 10 MG PO TABS: 10.0000 mg | ORAL_TABLET | Freq: Every day | ORAL | 3 refills | Status: DC

## 2020-08-19 MED ORDER — HYDROCHLOROTHIAZIDE 25 MG PO TABS: 25.0000 mg | ORAL_TABLET | Freq: Every day | ORAL | 1 refills | Status: DC

## 2020-08-19 MED ORDER — METFORMIN HCL 1000 MG PO TABS: 1000.0000 mg | ORAL_TABLET | Freq: Two times a day (BID) | ORAL | 6 refills | Status: DC

## 2020-08-19 MED ORDER — TRULICITY 1.5 MG/0.5ML ~~LOC~~ SOAJ: 1.5000 mg | SUBCUTANEOUS | 10 refills | Status: AC

## 2020-08-19 MED ORDER — VALSARTAN 80 MG PO TABS: 80.0000 mg | ORAL_TABLET | Freq: Every day | ORAL | 3 refills | Status: AC

## 2020-08-19 NOTE — Progress Notes (Signed)
1/26/20224:23 PM  Alejandro [name] [birth year], 60 y.o., male [id number]  Chief Complaint  Patient presents with  . Hypertension    Follow up currently taking - states currently only taking valsartan for BP   . Diabetes    Follow up     HPI:   Patient is a 60 y.o. male with past medical history significant for HTN, Hep C, DM who presents today for DM and HTN follow up.  DM BG range: 190-98 Checks BG twice a day Trulicity weekly .75 (Starts 1.12m dose 1/1) Metformin 10057mbid On atorvastatin 4055maily Amb ref to Medical Nutrition Therapy-MNT lplaced Continuing to work on LFM Needs eye exam: referral to optho placed A1c 12/1: 9.8 BG today: 102  Lab Results  Component Value Date   HGBA1C 8.3 (A) 08/19/2020   Lab Results  Component Value Date   LABMICR <3.0 07/06/2020   LABMICR 11.1 04/23/2019   MICROALBUR <0.2 08/26/2015   MICROALBUR 0.3 09/16/2014    Lab Results  Component Value Date   CHOL 127 04/30/2020   HDL 49 04/30/2020   LDLCALC 55 04/30/2020   TRIG 134 04/30/2020   CHOLHDL 2.6 04/30/2020     HTN Goal BP< 130/80 HCTZ 25 mg: Had not been taking Amlodipine 10 mg:  Had not been taking  Added Valsartan 6m26mst visit   BP Readings from Last 3 Encounters:  08/19/20 (!) 179/102  08/13/20 (!) 141/87  07/22/20 124/84     Screening Colonoscopy: 12/31/15 (due in 5 years (June 2022) Health Maintenance  Topic Date Due  . OPHTHALMOLOGY EXAM  08/02/2017  . INFLUENZA VACCINE  10/22/2020 (Originally 02/23/2020)  . COVID-19 Vaccine (4 - Booster for Pfizer series) 12/23/2020  . COLONOSCOPY (Pts [id number] coverage will need to be confirmed)  12/30/2020  . HEMOGLOBIN A1C  02/16/2021  . FOOT EXAM  04/30/2021  . TETANUS/TDAP  09/16/2024  . PNEUMOCOCCAL POLYSACCHARIDE VACCINE AGE 64-64 HIGH RISK  Completed  . Hepatitis C Screening  Completed  . HIV Screening  Completed     Dep[facility] screen [facility] 07/06/2020 06/24/2020  Decreased  Interest 0 0 0  Down, Depressed, Hopeless 0 0 0  PHQ - 2 Score 0 0 0    Fall Risk  07/22/2020 07/06/2020 06/24/2020 05/19/2020 04/30/2020  Falls in the past year? 0 0 0 0 0  Number falls in past yr: 0 0 0 0 0  Injury with Fall? 0 0 0 0 0  Risk for fall due to : - - - - No Fall Risks  Follow up Falls evaluation completed Falls evaluation completed Falls evaluation completed - Falls evaluation completed     Allergies  Allergen Reactions  . Tylenol [Acetaminophen] Hypertension    Prior to Admission medications   Medication Sig Start Date End Date Taking? Authorizing Provider  amLODipine (NORVASC) 5 MG tablet Take 1 tablet by mouth once daily 04/05/20  Yes SantiRutherford Guys atorvastatin (LIPITOR) 20 MG tablet Take 1 tablet by mouth once daily 04/05/20  Yes SantiRutherford Guys Blood Glucose Monitoring Suppl (ONE TOUCH ULTRA 2) w/Device KIT USE AS DIRECTED TO TEST BLOOD SUGAR IN THE MORNING 08/07/16  Yes English, Stephanie D, PA  Blood Glucose Monitoring Suppl KIT 1 application by Does not apply route every morning. 04/30/20  Yes SantiRutherford Guys Blood Pressure Monitoring (BLOOD PRESSURE CUFF) MISC Use as directed 07/07/17  Yes Shaw,Shawnee Knapp Capsaicin-Menthol-Methyl Sal (CAPSAICIN-METHYL SAL-MENTHOL) 0.025-1-12 %  CREA Apply 1 application topically 3 (three) times daily as needed (Leg pain). 05/19/20  Yes Taysom Glymph, Laurita Quint, FNP  fluticasone (FLONASE) 50 MCG/ACT nasal spray USE 1 TO 2 SPRAY(S) IN EACH NOSTRIL ONCE DAILY 03/05/20  Yes Rutherford Guys, MD  gabapentin (NEURONTIN) 400 MG capsule Take 1 capsule (400 mg total) by mouth 3 (three) times daily. 05/19/20  Yes Jenia Klepper, Laurita Quint, FNP  glucose blood (ONETOUCH VERIO) test strip USE ONE STRIP TO CHECK GLUCOSE TWICE DAILY 04/23/19  Yes Rutherford Guys, MD  hydrochlorothiazide (HYDRODIURIL) 25 MG tablet Take 1 tablet (25 mg total) by mouth daily. 04/30/20  Yes Rutherford Guys, MD  hydrocortisone (ANUSOL-HC) 25 MG suppository Place 1  suppository (25 mg total) rectally 2 (two) times daily. 10/30/19  Yes Rutherford Guys, MD  Lidocaine (HM LIDOCAINE PATCH) 4 % PTCH Apply 1 patch topically daily. Apply at night, leave on for 12 hours, remove in the morning issues 05/19/20  Yes Isola Mehlman, Laurita Quint, FNP  metFORMIN (GLUCOPHAGE-XR) 750 MG 24 hr tablet TAKE 1 TABLET BY MOUTH ONCE DAILY IN THE MORNING WITH BREAKFAST 04/30/20  Yes Rutherford Guys, MD    Past Medical History:  Diagnosis Date  . Diabetes mellitus without complication ([place])   . Fatty liver   . Hepatitis C   . Hypertension   . Tubular adenoma of colon 12/2015    Past Surgical History:  Procedure Laterality Date  . COLONOSCOPY      Social History   Tobacco Use  . Smoking status: Former Research scientist (life sciences)  . Smokeless tobacco: Never Used  Substance Use Topics  . Alcohol use: Yes    Comment: occasional wine    Family History  Problem Relation Age of Onset  . Heart disease Mother 32       CAD/AMI  . Diabetes Mother   . Hypertension Mother   . Diabetes Maternal Grandfather   . Diabetes Paternal Grandfather   . Diabetes Father   . Diabetes Sister   . Hypertension Sister   . Hypertension Brother   . Diabetes Brother   . Colon cancer Neg Hx   . Esophageal cancer Neg Hx   . Stomach cancer Neg Hx   . Rectal cancer Neg Hx     Review of Systems  Constitutional: Negative for chills, fever and malaise/fatigue.  Eyes: Negative for blurred vision and double vision.  Respiratory: Negative for cough, shortness of breath and wheezing.   Cardiovascular: Negative for chest pain, palpitations and leg swelling.  Gastrointestinal: Negative for abdominal pain, blood in stool, constipation, diarrhea, heartburn, nausea and vomiting.  Genitourinary: Negative for dysuria, frequency and hematuria.  Musculoskeletal: Negative for back pain and joint pain.  Skin: Negative for rash.  Neurological: Negative for dizziness, weakness and headaches.     OBJECTIVE:  Today's Vitals    08/19/20 1602  BP: (!) 179/102  Pulse: 87  Temp: 98.4 F (36.9 C)  SpO2: 99%  Weight: 232 lb (105.2 kg)  Height: 5' 10"  (1.778 m)   Body mass index is 33.29 kg/m.   Physical Exam Vitals reviewed.  Constitutional:      Appearance: Normal appearance.  Cardiovascular:     Rate and Rhythm: Normal rate and regular rhythm.     Pulses: Normal pulses.     Heart sounds: Normal heart sounds. No murmur heard. No friction rub. No gallop.   Pulmonary:     Effort: Pulmonary effort is normal. No respiratory distress.     Breath sounds: Normal  breath sounds. No stridor. No wheezing or rales.  Abdominal:     General: Bowel sounds are normal.     Palpations: Abdomen is soft.     Tenderness: There is no abdominal tenderness.  Musculoskeletal:     Right lower leg: No edema.     Left lower leg: No edema.  Skin:    General: Skin is warm and dry.  Neurological:     General: No focal deficit present.     Mental Status: He is alert and oriented to person, place, and time.  Psychiatric:        Mood and Affect: Mood normal.        Behavior: Behavior normal.     Results for orders placed or performed in visit on 08/19/20 (from the past 24 hour(s))  POCT glucose (manual entry)     Status: Abnormal   Collection Time: 08/19/20  4:08 PM  Result Value Ref Range   POC Glucose 102 (A) 70 - 99 mg/dl  POCT glycosylated hemoglobin (Hb A1C)     Status: Abnormal   Collection Time: 08/19/20  4:12 PM  Result Value Ref Range   Hemoglobin A1C 8.3 (A) 4.0 - 5.6 %   HbA1c POC (<> result, manual entry)     HbA1c, POC (prediabetic range)     HbA1c, POC (controlled diabetic range)      No results found.   ASSESSMENT and PLAN Problem List Items Addressed This Visit      Cardiovascular and Mediastinum   Essential hypertension   Relevant Medications   hydrochlorothiazide (HYDRODIURIL) 25 MG tablet   amLODipine (NORVASC) 10 MG tablet   valsartan (DIOVAN) 80 MG tablet   atorvastatin (LIPITOR) 40 MG  tablet   Chronic venous insufficiency   Relevant Medications   hydrochlorothiazide (HYDRODIURIL) 25 MG tablet   amLODipine (NORVASC) 10 MG tablet   valsartan (DIOVAN) 80 MG tablet   atorvastatin (LIPITOR) 40 MG tablet   gabapentin (NEURONTIN) 400 MG capsule   PVD (peripheral vascular disease) (HCC)   Relevant Medications   hydrochlorothiazide (HYDRODIURIL) 25 MG tablet   amLODipine (NORVASC) 10 MG tablet   valsartan (DIOVAN) 80 MG tablet   atorvastatin (LIPITOR) 40 MG tablet     Digestive   Chronic hepatitis C virus infection (HCC)     Endocrine   Type 2 diabetes mellitus with hyperglycemia, without long-term current use of insulin (HCC) - Primary   Relevant Medications   Dulaglutide (TRULICITY) 1.5 VP/3.6UZ SOPN   valsartan (DIOVAN) 80 MG tablet   atorvastatin (LIPITOR) 40 MG tablet   metFORMIN (GLUCOPHAGE) 1000 MG tablet     Plan  Restart amlodipine and HCTZ, continue valsartan 80  Increase Trulicity to 1.5, continue metformin 1010m bid  Continue atorvastatin 419m  Return in about 2 weeks (around 09/02/2020) for nurse visit to recheck BP in 2 weeks and follow up with provider in 3 months.   KeHuston Foleyust, FNP-BC [facility] [id number].  [phone]

## 2020-08-19 NOTE — Patient Instructions (Addendum)
For Blood pressure tak: HCTZ 25mg , Amlodipine 10mg , and Valsartan 80mg  daily  For Diabetes take Trulicity 1.5mg  weekly, and metformin 1000mg  twice a day  For Cholesterol take Atorvastatin 40 mg daily   Diabetes Mellitus and Nutrition, Adult When you have diabetes, or diabetes mellitus, it is very important to have healthy eating habits because your blood sugar (glucose) levels are greatly affected by what you eat and drink. Eating healthy foods in the right amounts, at about the same times every day, can help you:  Control your blood glucose.  Lower your risk of heart disease.  Improve your blood pressure.  Reach or maintain a healthy weight. What can affect my meal plan? Every person with diabetes is different, and each person has different needs for a meal plan. Your health care provider may recommend that you work with a dietitian to make a meal plan that is best for you. Your meal plan may vary depending on factors such as:  The calories you need.  The medicines you take.  Your weight.  Your blood glucose, blood pressure, and cholesterol levels.  Your activity level.  Other health conditions you have, such as heart or kidney disease. How do carbohydrates affect me? Carbohydrates, also called carbs, affect your blood glucose level more than any other type of food. Eating carbs naturally raises the amount of glucose in your blood. Carb counting is a method for keeping track of how many carbs you eat. Counting carbs is important to keep your blood glucose at a healthy level, especially if you use insulin or take certain oral diabetes medicines. It is important to know how many carbs you can safely have in each meal. This is different for every person. Your dietitian can help you calculate how many carbs you should have at each meal and for each snack. How does alcohol affect me? Alcohol can cause a sudden decrease in blood glucose (hypoglycemia), especially if you use insulin or  take certain oral diabetes medicines. Hypoglycemia can be a life-threatening condition. Symptoms of hypoglycemia, such as sleepiness, dizziness, and confusion, are similar to symptoms of having too much alcohol.  Do not drink alcohol if: ? Your health care provider tells you not to drink. ? You are pregnant, may be pregnant, or are planning to become pregnant.  If you drink alcohol: ? Do not drink on an empty stomach. ? Limit how much you use to:  0-1 drink a day for women.  0-2 drinks a day for men. ? Be aware of how much alcohol is in your drink. In the [place] one drink equals one 12 oz bottle of beer (355 mL), one 5 oz glass of wine (148 mL), or one 1 oz glass of hard liquor (44 mL). ? Keep yourself hydrated with water, diet soda, or unsweetened iced tea.  Keep in mind that regular soda, juice, and other mixers may contain a lot of sugar and must be counted as carbs. What are tips for following this plan? Reading food labels  Start by checking the serving size on the "Nutrition Facts" label of packaged foods and drinks. The amount of calories, carbs, fats, and other nutrients listed on the label is based on one serving of the item. Many items contain more than one serving per package.  Check the total grams (g) of carbs in one serving. You can calculate the number of servings of carbs in one serving by dividing the total carbs by 15. For example, if a food has 30  g of total carbs per serving, it would be equal to 2 servings of carbs.  Check the number of grams (g) of saturated fats and trans fats in one serving. Choose foods that have a low amount or none of these fats.  Check the number of milligrams (mg) of salt (sodium) in one serving. Most people should limit total sodium intake to less than 2,300 mg per day.  Always check the nutrition information of foods labeled as "low-fat" or "nonfat." These foods may be higher in added sugar or refined carbs and should be avoided.  Talk to  your dietitian to identify your daily goals for nutrients listed on the label. Shopping  Avoid buying canned, pre-made, or processed foods. These foods tend to be high in fat, sodium, and added sugar.  Shop around the outside edge of the grocery store. This is where you will most often find fresh fruits and vegetables, bulk grains, fresh meats, and fresh dairy. Cooking  Use low-heat cooking methods, such as baking, instead of high-heat cooking methods like deep frying.  Cook using healthy oils, such as olive, canola, or sunflower oil.  Avoid cooking with butter, cream, or high-fat meats. Meal planning  Eat meals and snacks regularly, preferably at the same times every day. Avoid going long periods of time without eating.  Eat foods that are high in fiber, such as fresh fruits, vegetables, beans, and whole grains. Talk with your dietitian about how many servings of carbs you can eat at each meal.  Eat 4-6 oz (112-168 g) of lean protein each day, such as lean meat, chicken, fish, eggs, or tofu. One ounce (oz) of lean protein is equal to: ? 1 oz (28 g) of meat, chicken, or fish. ? 1 egg. ?  cup (62 g) of tofu.  Eat some foods each day that contain healthy fats, such as avocado, nuts, seeds, and fish.   What foods should I eat? Fruits Berries. Apples. Oranges. Peaches. Apricots. Plums. Grapes. Mango. Papaya. Pomegranate. Kiwi. Cherries. Vegetables Lettuce. Spinach. Leafy greens, including kale, chard, collard greens, and mustard greens. Beets. Cauliflower. Cabbage. Broccoli. Carrots. Green beans. Tomatoes. Peppers. Onions. Cucumbers. Brussels sprouts. Grains Whole grains, such as whole-wheat or whole-grain bread, crackers, tortillas, cereal, and pasta. Unsweetened oatmeal. Quinoa. Brown or wild rice. Meats and other proteins Seafood. Poultry without skin. Lean cuts of poultry and beef. Tofu. Nuts. Seeds. Dairy Low-fat or fat-free dairy products such as milk, yogurt, and cheese. The  items listed above may not be a complete list of foods and beverages you can eat. Contact a dietitian for more information. What foods should I avoid? Fruits Fruits canned with syrup. Vegetables Canned vegetables. Frozen vegetables with butter or cream sauce. Grains Refined white flour and flour products such as bread, pasta, snack foods, and cereals. Avoid all processed foods. Meats and other proteins Fatty cuts of meat. Poultry with skin. Breaded or fried meats. Processed meat. Avoid saturated fats. Dairy Full-fat yogurt, cheese, or milk. Beverages Sweetened drinks, such as soda or iced tea. The items listed above may not be a complete list of foods and beverages you should avoid. Contact a dietitian for more information. Questions to ask a health care provider  Do I need to meet with a diabetes educator?  Do I need to meet with a dietitian?  What number can I call if I have questions?  When are the best times to check my blood glucose? Where to find more information:  American Diabetes Association: diabetes.org  Academy of Nutrition and Dietetics: www.eatright.[employer] of Diabetes and Digestive and Kidney Diseases: [employer]  Association of Diabetes Care and Education Specialists: www.diabeteseducator.org Summary  It is important to have healthy eating habits because your blood sugar (glucose) levels are greatly affected by what you eat and drink.  A healthy meal plan will help you control your blood glucose and maintain a healthy lifestyle.  Your health care provider may recommend that you work with a dietitian to make a meal plan that is best for you.  Keep in mind that carbohydrates (carbs) and alcohol have immediate effects on your blood glucose levels. It is important to count carbs and to use alcohol carefully. This information is not intended to replace advice given to you by your health care provider. Make sure you discuss any questions you  have with your health care provider. Document Revised: 06/18/2019 Document Reviewed: 06/18/2019 Elsevier Patient Education  2021 [employer].     If you have lab work done today you will be contacted with your lab results within the next 2 weeks.  If you have not heard from [place] then please contact us. The fastest way to get your results is to register for My Chart.   IF you received an x-ray today, you will receive an invoice from [facility]. Please contact [facility] at [phone] with questions or concerns regarding your invoice.   IF you received labwork today, you will receive an invoice from [facility]. Please contact [facility] at [phone] with questions or concerns regarding your invoice.   Our billing staff will not be able to assist you with questions regarding bills from these companies.  You will be contacted with the lab results as soon as they are available. The fastest way to get your results is to activate your My Chart account. Instructions are located on the last page of this paperwork. If you have not heard from [place] regarding the results in 2 weeks, please contact this office.

## 2020-09-02 ENCOUNTER — Other Ambulatory Visit: Payer: Self-pay

## 2020-09-02 ENCOUNTER — Ambulatory Visit: Payer: BC Managed Care – PPO | Admitting: Family Medicine

## 2020-09-02 VITALS — BP 125/82

## 2020-09-02 DIAGNOSIS — I10 Essential (primary) hypertension: Secondary | ICD-10-CM

## 2020-09-24 ENCOUNTER — Encounter: Payer: Self-pay | Admitting: Family Medicine

## 2020-09-24 ENCOUNTER — Ambulatory Visit: Payer: BC Managed Care – PPO | Admitting: Family Medicine

## 2020-09-24 ENCOUNTER — Other Ambulatory Visit: Payer: Self-pay

## 2020-09-24 VITALS — BP 116/74 | HR 88 | Temp 98.0°F | Ht 70.0 in | Wt 222.0 lb

## 2020-09-24 DIAGNOSIS — E1165 Type 2 diabetes mellitus with hyperglycemia: Secondary | ICD-10-CM

## 2020-09-24 DIAGNOSIS — I872 Venous insufficiency (chronic) (peripheral): Secondary | ICD-10-CM

## 2020-09-24 NOTE — Progress Notes (Signed)
3/3/20223:55 PM  Alejandro [name] Nov 10, 1960, 60 y.o., male [id number]  Chief Complaint  Patient presents with  . left inner leg darkness and itching     HPI:   Patient is a 60 y.o. male with past medical history significant for HTN, Hep C, DM who presents today for left leg pain  Left leg darkness, scaling and itching Pain is controlled  Taking gabapentin 441m tid Wears compression socks daily Works on Exercise Has seen vascular for chronic venous insufficiency Last seen 1/20   DM BG range: 120s Checks BG twice a day Trulicity weekly  18.2NO Metformin 10066mbid On atorvastatin 4035maily Continuing to work on [employer]FM[employer]eds eye exam: referral to optho placed last OV: Has not been yet  Lab Results  Component Value Date   HGBA1C 8.3 (A) 08/19/2020   HGBA1C 9.8 (A) 06/24/2020   HGBA1C 7.0 (H) 04/30/2020   Lab Results  Component Value Date   MICROALBUR <0.2 08/26/2015   LDLCALC 55 04/30/2020   CREATININE 1.06 06/22/2020    Lab Results  Component Value Date   CHOL 127 04/30/2020   HDL 49 04/30/2020   LDLCALC 55 04/30/2020   TRIG 134 04/30/2020   CHOLHDL 2.6 04/30/2020     HTN Goal BP< 130/80 Takes BP at home HCTZ 25 mg Amlodipine 10 mg Valsartan 41m22mP Readings from Last 3 Encounters:  09/24/20 116/74  09/02/20 125/82  08/19/20 (!) 179/102    Health Maintenance  Topic Date Due  . OPHTHALMOLOGY EXAM  08/02/2017  . INFLUENZA VACCINE  10/22/2020 (Originally 02/23/2020)  . COVID-19 Vaccine (4 - Booster for Pfizer series) 12/23/2020  . COLONOSCOPY (Pts 45-13yr68yrurance coverage will need to be confirmed)  12/30/2020  . HEMOGLOBIN A1C  02/16/2021  . FOOT EXAM  04/30/2021  . TETANUS/TDAP  09/16/2024  . PNEUMOCOCCAL POLYSACCHARIDE VACCINE AGE 43-64 HIGH RISK  Completed  . Hepatitis C Screening  Completed  . HIV Screening  Completed  . HPV VACCINES  Aged Out     Depression screen [facility] 07/22/2020 07/06/2020  Decreased Interest 0 0 0   Down, Depressed, Hopeless 0 0 0  PHQ - 2 Score 0 0 0    Fall Risk  09/24/2020 07/22/2020 07/06/2020 06/24/2020 05/19/2020  Falls in the past year? 0 0 0 0 0  Number falls in past yr: 0 0 0 0 0  Injury with Fall? 0 0 0 0 0  Risk for fall due to : - - - - -  Follow up Falls evaluation completed Falls evaluation completed Falls evaluation completed Falls evaluation completed -     Allergies  Allergen Reactions  . Tylenol [Acetaminophen] Hypertension    Prior to Admission medications   Medication Sig Start Date End Date Taking? Authorizing Provider  amLODipine (NORVASC) 5 MG tablet Take 1 tablet by mouth once daily 04/05/20  Yes SantiRutherford Guys atorvastatin (LIPITOR) 20 MG tablet Take 1 tablet by mouth once daily 04/05/20  Yes SantiRutherford Guys Blood Glucose Monitoring Suppl (ONE TOUCH ULTRA 2) w/Device KIT USE AS DIRECTED TO TEST BLOOD SUGAR IN THE MORNING 08/07/16  Yes English, Stephanie D, PA  Blood Glucose Monitoring Suppl KIT 1 application by Does not apply route every morning. 04/30/20  Yes SantiRutherford Guys Blood Pressure Monitoring (BLOOD PRESSURE CUFF) MISC Use as directed 07/07/17  Yes Shaw,Shawnee Knapp Capsaicin-Menthol-Methyl Sal (CAPSAICIN-METHYL SAL-MENTHOL) 0.025-1-12 % CREA Apply 1 application topically 3 (  three) times daily as needed (Leg pain). 05/19/20  Yes Narcisa Ganesh, Laurita Quint, FNP  fluticasone (FLONASE) 50 MCG/ACT nasal spray USE 1 TO 2 SPRAY(S) IN EACH NOSTRIL ONCE DAILY 03/05/20  Yes Rutherford Guys, MD  gabapentin (NEURONTIN) 400 MG capsule Take 1 capsule (400 mg total) by mouth 3 (three) times daily. 05/19/20  Yes Jden Want, Laurita Quint, FNP  glucose blood (ONETOUCH VERIO) test strip USE ONE STRIP TO CHECK GLUCOSE TWICE DAILY 04/23/19  Yes Rutherford Guys, MD  hydrochlorothiazide (HYDRODIURIL) 25 MG tablet Take 1 tablet (25 mg total) by mouth daily. 04/30/20  Yes Rutherford Guys, MD  hydrocortisone (ANUSOL-HC) 25 MG suppository Place 1 suppository (25 mg total) rectally 2  (two) times daily. 10/30/19  Yes Rutherford Guys, MD  Lidocaine (HM LIDOCAINE PATCH) 4 % PTCH Apply 1 patch topically daily. Apply at night, leave on for 12 hours, remove in the morning issues 05/19/20  Yes Nannette Zill, Laurita Quint, FNP  metFORMIN (GLUCOPHAGE-XR) 750 MG 24 hr tablet TAKE 1 TABLET BY MOUTH ONCE DAILY IN THE MORNING WITH BREAKFAST 04/30/20  Yes Rutherford Guys, MD    Past Medical History:  Diagnosis Date  . Diabetes mellitus without complication ([place])   . Fatty liver   . Hepatitis C   . Hypertension   . Tubular adenoma of colon 12/2015    Past Surgical History:  Procedure Laterality Date  . COLONOSCOPY      Social History   Tobacco Use  . Smoking status: Former Research scientist (life sciences)  . Smokeless tobacco: Never Used  Substance Use Topics  . Alcohol use: Yes    Comment: occasional wine    Family History  Problem Relation Age of Onset  . Heart disease Mother 67       CAD/AMI  . Diabetes Mother   . Hypertension Mother   . Diabetes Maternal Grandfather   . Diabetes Paternal Grandfather   . Diabetes Father   . Diabetes Sister   . Hypertension Sister   . Hypertension Brother   . Diabetes Brother   . Colon cancer Neg Hx   . Esophageal cancer Neg Hx   . Stomach cancer Neg Hx   . Rectal cancer Neg Hx     Review of Systems  Constitutional: Negative for chills, fever and malaise/fatigue.  Eyes: Negative for blurred vision and double vision.  Respiratory: Negative for cough, shortness of breath and wheezing.   Cardiovascular: Negative for chest pain, palpitations and leg swelling.  Gastrointestinal: Negative for abdominal pain, blood in stool, constipation, diarrhea, heartburn, nausea and vomiting.  Genitourinary: Negative for dysuria, frequency and hematuria.  Musculoskeletal: Negative for back pain and joint pain.  Skin: Negative for rash.  Neurological: Negative for dizziness, weakness and headaches.     OBJECTIVE:  Today's Vitals   09/24/20 1539  BP: 116/74  [name]: 88   Temp: 98 F (36.7 C)  SpO2: 96%  Weight: 222 lb (100.7 kg)  Height: 5' 10"  (1.778 m)   Body mass index is 31.85 kg/m.   Physical Exam Vitals reviewed.  Constitutional:      Appearance: Normal appearance.  Cardiovascular:     Rate and Rhythm: Normal rate and regular rhythm.     Pulses: Normal pulses.          Dorsalis pedis pulses are 2+ on the right side and 2+ on the left side.       Posterior tibial pulses are 2+ on the right side and 2+ on the left side.  Heart sounds: Normal heart sounds. No murmur heard. No friction rub. No gallop.   Pulmonary:     Effort: Pulmonary effort is normal. No respiratory distress.     Breath sounds: Normal breath sounds. No stridor. No wheezing or rales.  Abdominal:     General: Bowel sounds are normal.     Palpations: Abdomen is soft.     Tenderness: There is no abdominal tenderness.  Musculoskeletal:     Right lower leg: No edema.     Left lower leg: No edema.  Skin:    General: Skin is warm and dry.       Neurological:     General: No focal deficit present.     Mental Status: He is alert and oriented to person, place, and time.  Psychiatric:        Mood and Affect: Mood normal.        Behavior: Behavior normal.     No results found for this or any previous visit (from the past 24 hour(s)).  No results found.   ASSESSMENT and PLAN Problem List Items Addressed This Visit      Cardiovascular and Mediastinum   Chronic venous insufficiency - Primary     Endocrine   Type 2 diabetes mellitus with hyperglycemia, without long-term current use of insulin ([place])     Plan Preventative care: keep moisturized (vaseline based ointment), exercise, compression socks, no smoking,  RTC/ED precautions provided   Return if symptoms worsen or fail to improve, for Next scheduled visit.   Huston Foley Shristi Scheib, FNP-BC [facility] at [facility], [place] [address] Ph.  [phone] Fax [phone]

## 2020-09-24 NOTE — Patient Instructions (Addendum)
Chronic Venous Insufficiency Chronic venous insufficiency is a condition where the leg veins cannot effectively pump blood from the legs to the heart. This happens when the vein walls are either stretched, weakened, or damaged, or when the valves inside the vein are damaged. With the right treatment, you should be able to continue with an active life. This condition is also called venous stasis. What are the causes? Common causes of this condition include:  High blood pressure inside the veins (venous hypertension).  Sitting or standing too long, causing increased blood pressure in the leg veins.  A blood clot that blocks blood flow in a vein (deep vein thrombosis, DVT).  Inflammation of a vein (phlebitis) that causes a blood clot to form.  Tumors in the pelvis that cause blood to back up. What increases the risk? The following factors may make you more likely to develop this condition:  Having a family history of this condition.  Obesity.  Pregnancy.  Living without enough regular physical activity or exercise (sedentary lifestyle).  Smoking.  Having a job that requires long periods of standing or sitting in one place.  Being a certain age. Women in their 43s and 66s and men in their 9s are more likely to develop this condition. What are the signs or symptoms? Symptoms of this condition include:  Veins that are enlarged, bulging, or twisted (varicose veins).  Skin breakdown or ulcers.  Reddened skin or dark discoloration of skin on the leg between the knee and ankle.  Brown, smooth, tight, and painful skin just above the ankle, usually on the inside of the leg (lipodermatosclerosis).  Swelling of the legs. How is this diagnosed? This condition may be diagnosed based on:  Your medical history.  A physical exam.  Tests, such as: ? A procedure that creates an image of a blood vessel and nearby organs and provides information about blood flow through the blood  vessel (duplex ultrasound). ? A procedure that tests blood flow (plethysmography). ? A procedure that looks at the veins using X-ray and dye (venogram). How is this treated? The goals of treatment are to help you return to an active life and to minimize pain or disability. Treatment depends on the severity of your condition, and it may include:  Wearing compression stockings. These can help relieve symptoms and help prevent your condition from getting worse. However, they do not cure the condition.  Sclerotherapy. This procedure involves an injection of a solution that shrinks damaged veins.  Surgery. This may involve: ? Removing a diseased vein (vein stripping). ? Cutting off blood flow through the vein (laser ablation surgery). ? Repairing or reconstructing a valve within the affected vein.   Follow these instructions at home:  Wear compression stockings as told by your health care provider. These stockings help to prevent blood clots and reduce swelling in your legs.  Take over-the-counter and prescription medicines only as told by your health care provider.  Stay active by exercising, walking, or doing different activities. Ask your health care provider what activities are safe for you and how much exercise you need.  Drink enough fluid to keep your urine pale yellow.  Do not use any products that contain nicotine or tobacco, such as cigarettes, e-cigarettes, and chewing tobacco. If you need help quitting, ask your health care provider.  Keep all follow-up visits as told by your health care provider. This is important.      Contact a health care provider if you:  Have  redness, swelling, or more pain in the affected area.  See a red streak or line that goes up or down from the affected area.  Have skin breakdown or skin loss in the affected area, even if the breakdown is small.  Get an injury in the affected area. Get help right away if:  You get an injury and an open wound  in the affected area.  You have: ? Severe pain that does not get better with medicine. ? Sudden numbness or weakness in the foot or ankle below the affected area. ? Trouble moving your foot or ankle. ? A fever. ? Worse or persistent symptoms. ? Chest pain. ? Shortness of breath. Summary  Chronic venous insufficiency is a condition where the leg veins cannot effectively pump blood from the legs to the heart.  Chronic venous insufficiency occurs when the vein walls become stretched, weakened, or damaged, or when valves within the vein are damaged.  Treatment depends on how severe your condition is. It often involves wearing compression stockings and may involve having a procedure.  Make sure you stay active by exercising, walking, or doing different activities. Ask your health care provider what activities are safe for you and how much exercise you need. This information is not intended to replace advice given to you by your health care provider. Make sure you discuss any questions you have with your health care provider. Document Revised: 04/03/2018 Document Reviewed: 04/03/2018 Elsevier Patient Education  2021 [employer].   If you have lab work done today you will be contacted with your lab results within the next 2 weeks.  If you have not heard from [place] then please contact us. The fastest way to get your results is to register for My Chart.   IF you received an x-ray today, you will receive an invoice from [facility]. Please contact [facility] at [phone] with questions or concerns regarding your invoice.   IF you received labwork today, you will receive an invoice from [facility]. Please contact [facility] at [phone] with questions or concerns regarding your invoice.   Our billing staff will not be able to assist you with questions regarding bills from these companies.  You will be contacted with the lab results as soon as they are available. The fastest  way to get your results is to activate your My Chart account. Instructions are located on the last page of this paperwork. If you have not heard from [place] regarding the results in 2 weeks, please contact this office.

## 2020-09-25 ENCOUNTER — Ambulatory Visit (HOSPITAL_COMMUNITY)
Admission: EM | Admit: 2020-09-25 | Discharge: 2020-09-25 | Disposition: A | Discharge status: Home / Self Care | Payer: Worker's Compensation | Attending: Internal Medicine | Admitting: Internal Medicine

## 2020-09-25 ENCOUNTER — Encounter (HOSPITAL_COMMUNITY): Payer: Self-pay

## 2020-09-25 ENCOUNTER — Other Ambulatory Visit: Payer: Self-pay

## 2020-09-25 DIAGNOSIS — S838X1A Sprain of other specified parts of right knee, initial encounter: Secondary | ICD-10-CM | POA: Diagnosis not present

## 2020-09-25 NOTE — ED Triage Notes (Signed)
Pt reports he slipped and fell on the ground yesterday at work. States having right knee pain.

## 2020-09-25 NOTE — Discharge Instructions (Signed)
Icing of the right knee Gentle range of motion exercises Tylenol or Motrin as needed for pain Return to urgent care if you notice worsening pain, swelling, bruising or redness of the knee.

## 2020-09-25 NOTE — ED Provider Notes (Signed)
[place] [place]    CSN: [id number] Arrival date & time: 09/25/20  1636      History   Chief Complaint Chief Complaint  Patient presents with  . Fall  . Knee Pain    HPI Alejandro [name] is a 60 y.o. male comes to urgent care with right knee pain which happened after he slipped and fell yesterday.  Patient slipped and fell at work.  He describes the pain as sharp and throbbing.  Pain is currently of moderate severity.  Patient is able to bear weight on the knee.  No knee swelling.  Pain is aggravated by movement.  He has not tried any over-the-counter medication.  No knee swelling.  No redness or bruising over the knee.   HPI  Past Medical History:  Diagnosis Date  . Diabetes mellitus without complication ([place])   . Fatty liver   . Hepatitis C   . Hypertension   . Tubular adenoma of colon 12/2015    Patient Active Problem List   Diagnosis Date Noted  . PVD (peripheral vascular disease) ([place]) 08/19/2020  . Chronic venous insufficiency 05/19/2020  . Essential hypertension 03/11/2017  . Type 2 diabetes mellitus with hyperglycemia, without long-term current use of insulin ([place]) 09/14/2014  . Gallstones 09/14/2014  . Chronic hepatitis C virus infection ([place]) 12/01/2008  . ANEMIA, IRON DEFICIENCY 12/01/2008  . BLOOD IN STOOL 12/01/2008    Past Surgical History:  Procedure Laterality Date  . COLONOSCOPY         Home Medications    Prior to Admission medications   Medication Sig Start Date End Date Taking? Authorizing Provider  amLODipine (NORVASC) 10 MG tablet Take 1 tablet (10 mg total) by mouth daily. 08/19/20   Just, Laurita Quint, FNP  atorvastatin (LIPITOR) 40 MG tablet Take 1 tablet (40 mg total) by mouth daily. 08/19/20   Just, Laurita Quint, FNP  Blood Glucose Monitoring Suppl (ONE TOUCH ULTRA 2) w/Device KIT USE AS DIRECTED TO TEST BLOOD SUGAR IN THE MORNING 08/07/16   Ivar Drape D, PA  Blood Glucose Monitoring Suppl KIT 1 application by Does not apply  route every morning. 04/30/20   Daleen Squibb, MD  Blood Pressure Monitoring (BLOOD PRESSURE CUFF) MISC Use as directed 07/07/17   Shawnee Knapp, MD  Dulaglutide (TRULICITY) 1.5 PR/9.4VO SOPN Inject 1.5 mg into the skin once a week. 08/19/20   Just, Laurita Quint, FNP  fluticasone (FLONASE) 50 MCG/ACT nasal spray USE 1 TO 2 SPRAY(S) IN EACH NOSTRIL ONCE DAILY 03/05/20   Jacelyn Pi, Lilia Argue, MD  gabapentin (NEURONTIN) 400 MG capsule Take 1 capsule (400 mg total) by mouth 3 (three) times daily. 08/19/20   Just, Laurita Quint, FNP  glucose blood (ONETOUCH VERIO) test strip USE ONE STRIP TO CHECK GLUCOSE TWICE DAILY 04/23/19   Jacelyn Pi, Lilia Argue, MD  hydrochlorothiazide (HYDRODIURIL) 25 MG tablet Take 1 tablet (25 mg total) by mouth daily. 08/19/20   Just, Laurita Quint, FNP  metFORMIN (GLUCOPHAGE) 1000 MG tablet Take 1 tablet (1,000 mg total) by mouth 2 (two) times daily with a meal. 08/19/20   Just, Laurita Quint, FNP  valsartan (DIOVAN) 80 MG tablet Take 1 tablet (80 mg total) by mouth daily. 08/19/20   Just, Laurita Quint, FNP    Family History Family History  Problem Relation Age of Onset  . Heart disease Mother 36       CAD/AMI  . Diabetes Mother   . Hypertension Mother   .  Diabetes Maternal Grandfather   . Diabetes Paternal Grandfather   . Diabetes Father   . Diabetes Sister   . Hypertension Sister   . Hypertension Brother   . Diabetes Brother   . Colon cancer Neg Hx   . Esophageal cancer Neg Hx   . Stomach cancer Neg Hx   . Rectal cancer Neg Hx     Social History Social History   Tobacco Use  . Smoking status: Former Games developer  . Smokeless tobacco: Never Used  Vaping Use  . Vaping Use: Never used  Substance Use Topics  . Alcohol use: Yes    Comment: occasional wine  . Drug use: No     Allergies   Tylenol [acetaminophen]   Review of Systems Review of Systems  Constitutional: Negative.   Musculoskeletal: Positive for arthralgias. Negative for joint swelling, myalgias, neck pain and neck  stiffness.  Skin: Negative.   Neurological: Negative.      Physical Exam Triage Vital Signs ED Triage Vitals  Enc Vitals Group     BP 09/25/20 1710 135/86     Pulse Rate 09/25/20 1710 91     Resp 09/25/20 1710 19     Temp 09/25/20 1710 98.5 F (36.9 C)     Temp Source 09/25/20 1710 Oral     SpO2 09/25/20 1710 100 %     Weight --      Height --      Head Circumference --      Peak Flow --      Pain Score 09/25/20 1711 6     Pain Loc --      Pain Edu? --      Excl. in GC? --    No data found.  Updated Vital Signs BP 135/86 (BP Location: Right Arm)   Pulse 91   Temp 98.5 F (36.9 C) (Oral)   Resp 19   SpO2 100%   Visual Acuity Right Eye Distance:   Left Eye Distance:   Bilateral Distance:    Right Eye Near:   Left Eye Near:    Bilateral Near:     Physical Exam Vitals and nursing note reviewed.  Constitutional:      General: He is not in acute distress.    Appearance: He is not ill-appearing.  Musculoskeletal:        General: No swelling, tenderness, deformity or signs of injury. Normal range of motion.     Comments: Right knee evaluation: Anterior and posterior drawer signs are negative.  No tenderness over the lateral or medial collateral ligaments of the right knee  Left knee evaluation: No abnormalities noted.  Neurological:     Mental Status: He is alert.      UC Treatments / Results  Labs (all labs ordered are listed, but only abnormal results are displayed) Labs Reviewed - No data to display  EKG   Radiology No results found.  Procedures Procedures (including critical care time)  Medications Ordered in UC Medications - No data to display  Initial Impression / Assessment and Plan / UC Course  I have reviewed the triage vital signs and the nursing notes.  Pertinent labs & imaging results that were available during my care of the patient were reviewed by me and considered in my medical decision making (see chart for details).     1.   Right knee sprain: Gentle range of motion exercises Icing of the right knee Tylenol Motrin as needed for pain If you experience worsening  pain, swelling of the knee or bruising please return to urgent care to be reevaluated. Final Clinical Impressions(s) / UC Diagnoses   Final diagnoses:  Sprain of other ligament of right knee, initial encounter     Discharge Instructions     Icing of the right knee Gentle range of motion exercises Tylenol or Motrin as needed for pain Return to urgent care if you notice worsening pain, swelling, bruising or redness of the knee.   ED Prescriptions    None     PDMP not reviewed this encounter.   Chase Picket, MD 09/25/20 [phone]

## 2020-11-18 ENCOUNTER — Ambulatory Visit: Payer: BC Managed Care – PPO | Admitting: Family Medicine

## 2021-05-01 ENCOUNTER — Encounter: Payer: Self-pay | Admitting: Gastroenterology

## 2021-08-02 ENCOUNTER — Encounter: Payer: Self-pay | Admitting: Gastroenterology

## 2021-08-06 ENCOUNTER — Other Ambulatory Visit: Payer: Self-pay

## 2021-08-06 ENCOUNTER — Ambulatory Visit (AMBULATORY_SURGERY_CENTER): Payer: BC Managed Care – PPO | Admitting: *Deleted

## 2021-08-06 VITALS — Ht 70.0 in | Wt 220.0 lb

## 2021-08-06 DIAGNOSIS — Z86010 Personal history of colonic polyps: Secondary | ICD-10-CM

## 2021-08-06 MED ORDER — PEG 3350-KCL-NA BICARB-NACL 420 G PO SOLR: 4000.0000 mL | Freq: Once | ORAL | 0 refills | Status: AC

## 2021-08-06 NOTE — Progress Notes (Signed)
No egg or soy allergy known to patient  No issues known to pt with past sedation with any surgeries or procedures Patient denies ever being told they had issues or difficulty with intubation  No FH of Malignant Hyperthermia Pt is not on diet pills Pt is not on  home 02  Pt is not on blood thinners  Pt denies issues with constipation  No A fib or A flutter  Pt is fully vaccinated  for Covid    Due to the COVID-19 pandemic we are asking patients to follow certain guidelines in PV and the [place]   Pt aware of COVID protocols and [facility] guidelines   PV completed over the phone. Pt verified name, DOB, address and insurance during PV today.  Pt mailed instruction packet with copy of consent form to read and not return, and instructions.  Pt encouraged to call with questions or issues.  If pt has My chart, procedure instructions sent via My Chart    SAMPLE PAGE OF OVER THE COUNTER ITEMS TO PURCHASE SENT

## 2021-08-16 ENCOUNTER — Other Ambulatory Visit: Payer: Self-pay

## 2021-08-16 ENCOUNTER — Encounter: Payer: Self-pay | Admitting: Gastroenterology

## 2021-08-16 DIAGNOSIS — I872 Venous insufficiency (chronic) (peripheral): Secondary | ICD-10-CM

## 2021-08-23 ENCOUNTER — Encounter: Payer: Self-pay | Admitting: Gastroenterology

## 2021-08-23 ENCOUNTER — Ambulatory Visit (AMBULATORY_SURGERY_CENTER): Payer: BC Managed Care – PPO | Admitting: Gastroenterology

## 2021-08-23 VITALS — BP 121/85 | HR 73 | Temp 98.7°F | Resp 13 | Ht 70.0 in | Wt 220.0 lb

## 2021-08-23 DIAGNOSIS — Z86010 Personal history of colonic polyps: Secondary | ICD-10-CM

## 2021-08-23 DIAGNOSIS — D123 Benign neoplasm of transverse colon: Secondary | ICD-10-CM | POA: Diagnosis not present

## 2021-08-23 DIAGNOSIS — D124 Benign neoplasm of descending colon: Secondary | ICD-10-CM

## 2021-08-23 DIAGNOSIS — D122 Benign neoplasm of ascending colon: Secondary | ICD-10-CM | POA: Diagnosis not present

## 2021-08-23 MED ORDER — SODIUM CHLORIDE 0.9 % IV SOLN: 500.0000 mL | Freq: Once | INTRAVENOUS | Status: DC

## 2021-08-23 NOTE — Op Note (Signed)
[place] Patient Name: Alejandro [name] Procedure Date: 08/23/2021 3:30 PM MRN: [id number] Endoscopist: Ladene Artist , MD Age: 61 Referring MD:  Date of Birth: 10-11-1960 Gender: Male Account #: [id number] Procedure:                Colonoscopy Indications:              Surveillance: Personal history of adenomatous                            polyps on last colonoscopy > 5 years ago Medicines:                Monitored Anesthesia Care Procedure:                Pre-Anesthesia Assessment:                           - Prior to the procedure, a History and Physical                            was performed, and patient medications and                            allergies were reviewed. The patient's tolerance of                            previous anesthesia was also reviewed. The risks                            and benefits of the procedure and the sedation                            options and risks were discussed with the patient.                            All questions were answered, and informed consent                            was obtained. Prior Anticoagulants: The patient has                            taken no previous anticoagulant or antiplatelet                            agents. ASA Grade Assessment: II - A patient with                            mild systemic disease. After reviewing the risks                            and benefits, the patient was deemed in                            satisfactory condition to undergo the procedure.  After obtaining informed consent, the colonoscope                            was passed under direct vision. Throughout the                            procedure, the patient's blood pressure, pulse, and                            oxygen saturations were monitored continuously. The                            CF [id number] #[id number] was introduced through the anus                            and advanced to the  the cecum, identified by                            appendiceal orifice and ileocecal valve. The                            ileocecal valve, appendiceal orifice, and rectum                            were photographed. The quality of the bowel                            preparation was good. The colonoscopy was performed                            without difficulty. The patient tolerated the                            procedure well. Scope In: 3:45:45 PM Scope Out: 3:59:53 PM Scope Withdrawal Time: 0 hours 12 minutes 9 seconds  Total Procedure Duration: 0 hours 14 minutes 8 seconds  Findings:                 External hemorrhoids were found on perianal exam.                           Three sessile polyps were found in the transverse                            colon. The polyps were 5 to 7 mm in size. These                            polyps were removed with a cold snare. Resection                            and retrieval were complete.                           A 8 mm polyp was found in the descending colon.  The                            polyp was semi-pedunculated. The polyp was removed                            with a cold snare. Resection and retrieval were                            complete.                           Multiple small-mouthed diverticula were found in                            the left colon. There was narrowing of the colon in                            association with the diverticular opening. There                            was evidence of diverticular spasm.                            Peri-diverticular erythema was seen. There was no                            evidence of diverticular bleeding.                           Internal hemorrhoids were found during                            retroflexion. The hemorrhoids were moderate and                            Grade I (internal hemorrhoids that do not prolapse).                           The exam was otherwise  without abnormality on                            direct and retroflexion views. Complications:            No immediate complications. Estimated blood loss:                            None. Estimated Blood Loss:     Estimated blood loss: none. Impression:               - Hemorrhoids found on perianal exam.                           - Three 5 to 7 mm polyps in the transverse colon,  removed with a cold snare. Resected and retrieved.                           - One 8 mm polyp in the descending colon, removed                            with a cold snare. Resected and retrieved.                           - Moderate diverticulosis in the left colon.                           - Internal hemorrhoids.                           - The examination was otherwise normal on direct                            and retroflexion views. Recommendation:           - Repeat colonoscopy after studies are complete for                            surveillance based on pathology results.                           - Patient has a contact number available for                            emergencies. The signs and symptoms of potential                            delayed complications were discussed with the                            patient. Return to normal activities tomorrow.                            Written discharge instructions were provided to the                            patient.                           - High fiber diet long term.                           - Continue present medications.                           - Await pathology results.                           - Surgical referral for consideration of  hemorrhoidectomy. Ladene Artist, MD 08/23/2021 4:06:22 PM This report has been signed electronically.

## 2021-08-23 NOTE — Progress Notes (Signed)
To PACU, VSS. Report to [email or web address] 

## 2021-08-23 NOTE — Progress Notes (Signed)
Pt's states no medical or surgical changes since previsit or office visit. 

## 2021-08-23 NOTE — Progress Notes (Signed)
History & Physical  Primary Care Physician:  Just, Laurita Quint, FNP (Inactive) Primary Gastroenterologist: Lucio Edward, MD  CHIEF COMPLAINT:  Personal history of colon polyps   HPI: Alejandro [name] is a 61 y.o. male with a personal history of adenomatous colon polyps for surveillance colonoscopy.   Past Medical History:  Diagnosis Date   Blood transfusion without reported diagnosis    AS A BABY   Diabetes mellitus without complication ([place])    Fatty liver    Hepatitis C    Hyperlipidemia    Hypertension    Tubular adenoma of colon 12/2015    Past Surgical History:  Procedure Laterality Date   COLONOSCOPY     POLYPECTOMY      Prior to Admission medications   Medication Sig Start Date End Date Taking? Authorizing Provider  amLODipine (NORVASC) 10 MG tablet Take 1 tablet (10 mg total) by mouth daily. 08/19/20  Yes Just, Laurita Quint, FNP  atorvastatin (LIPITOR) 40 MG tablet Take 1 tablet (40 mg total) by mouth daily. 08/19/20  Yes Just, Laurita Quint, FNP  Dulaglutide (TRULICITY) 1.5 HY/8.5OY SOPN Inject 1.5 mg into the skin once a week. 08/19/20  Yes Just, Laurita Quint, FNP  gabapentin (NEURONTIN) 400 MG capsule Take 1 capsule (400 mg total) by mouth 3 (three) times daily. 08/19/20  Yes Just, Laurita Quint, FNP  hydrochlorothiazide (HYDRODIURIL) 25 MG tablet Take 1 tablet (25 mg total) by mouth daily. 08/19/20  Yes Just, Laurita Quint, FNP  metFORMIN (GLUCOPHAGE) 1000 MG tablet Take 1 tablet (1,000 mg total) by mouth 2 (two) times daily with a meal. 08/19/20  Yes Just, Laurita Quint, FNP  valsartan (DIOVAN) 80 MG tablet Take 1 tablet (80 mg total) by mouth daily. 08/19/20  Yes Just, Laurita Quint, FNP  Blood Glucose Monitoring Suppl (ONE TOUCH ULTRA 2) w/Device KIT USE AS DIRECTED TO TEST BLOOD SUGAR IN THE MORNING 08/07/16   Ivar Drape D, PA  Blood Glucose Monitoring Suppl KIT 1 application by Does not apply route every morning. 04/30/20   Daleen Squibb, MD  Blood Pressure Monitoring (BLOOD PRESSURE  CUFF) MISC Use as directed 07/07/17   Shawnee Knapp, MD  fluticasone [facility]) 50 MCG/ACT nasal spray USE 1 TO 2 SPRAY(S) IN [facility] NOSTRIL ONCE DAILY Patient not taking: Reported on 08/06/2021 03/05/20   Jacelyn Pi, Lilia Argue, MD  glucose blood [facility] VERIO) test strip USE ONE STRIP TO CHECK GLUCOSE TWICE DAILY 04/23/19   Jacelyn Pi, Lilia Argue, MD    Current Outpatient Medications  Medication Sig Dispense Refill   amLODipine (NORVASC) 10 MG tablet Take 1 tablet (10 mg total) by mouth daily. 90 tablet 3   atorvastatin (LIPITOR) 40 MG tablet Take 1 tablet (40 mg total) by mouth daily. 90 tablet 2   Dulaglutide (TRULICITY) 1.5 DX/4.1OI SOPN Inject 1.5 mg into the skin once a week. 0.5 mL 10   gabapentin (NEURONTIN) 400 MG capsule Take 1 capsule (400 mg total) by mouth 3 (three) times daily. 90 capsule 3   hydrochlorothiazide (HYDRODIURIL) 25 MG tablet Take 1 tablet (25 mg total) by mouth daily. 90 tablet 1   metFORMIN (GLUCOPHAGE) 1000 MG tablet Take 1 tablet (1,000 mg total) by mouth 2 (two) times daily with a meal. 90 tablet 6   valsartan (DIOVAN) 80 MG tablet Take 1 tablet (80 mg total) by mouth daily. 90 tablet 3   Blood Glucose Monitoring Suppl (ONE TOUCH ULTRA 2) w/Device KIT USE AS DIRECTED TO TEST BLOOD  SUGAR IN THE MORNING 1 each 0   Blood Glucose Monitoring Suppl KIT 1 application by Does not apply route every morning. 1 kit 3   Blood Pressure Monitoring (BLOOD PRESSURE CUFF) MISC Use as directed 1 each 0   fluticasone (FLONASE) 50 MCG/ACT nasal spray USE 1 TO 2 SPRAY(S) IN EACH NOSTRIL ONCE DAILY (Patient not taking: Reported on 08/06/2021) 16 g 0   glucose blood (ONETOUCH VERIO) test strip USE ONE STRIP TO CHECK GLUCOSE TWICE DAILY 150 each 6   Current Facility-Administered Medications  Medication Dose Route Frequency Provider Last Rate Last Admin   0.9 %  sodium chloride infusion  500 mL Intravenous Once Ladene Artist, MD        Allergies as of 08/23/2021 - Review Complete  08/23/2021  Allergen Reaction Noted   Tylenol [acetaminophen] Hypertension 09/13/2011    Family History  Problem Relation Age of Onset   Heart disease Mother 38       CAD/AMI   Diabetes Mother    Hypertension Mother    Diabetes Father    Diabetes Sister    Hypertension Sister    Hypertension Brother    Diabetes Brother    Diabetes Maternal Grandfather    Diabetes Paternal Grandfather    Colon cancer Neg Hx    Esophageal cancer Neg Hx    Stomach cancer Neg Hx    Rectal cancer Neg Hx    Colon polyps Neg Hx     Social History   Socioeconomic History   Marital status: Married    Spouse name: Not on file   Number of children: Not on file   Years of education: Not on file   Highest education level: Not on file  Occupational History   Not on file  Tobacco Use   Smoking status: Former   Smokeless tobacco: Never  Vaping Use   Vaping Use: Never used  Substance and Sexual Activity   Alcohol use: Yes    Comment: occasional wine   Drug use: No   Sexual activity: Yes  Other Topics Concern   Not on file  Social History Narrative   Marital status: married x 24 years      Children: 1 child; no grandchildren      Lives: with wife; daughter in [place]      Employment: Estate agent x 14 years; [employer]      Tobacco: quit smoking after ten years      Alcohol: 3 glasses of wine weekly      Exercise: none            Social Determinants of Radio broadcast assistant Strain: Not on file  Food Insecurity: Not on file  Transportation Needs: Not on file  Physical Activity: Not on file  Stress: Not on file  Social Connections: Not on file  Intimate Partner Violence: Not on file    Review of Systems:  All systems reviewed an negative except where noted in HPI.  Gen: Denies any fever, chills, sweats, anorexia, fatigue, weakness, malaise, weight loss, and sleep disorder CV: Denies chest pain, angina,  palpitations, syncope, orthopnea, PND, peripheral edema, and claudication. Resp: Denies dyspnea at rest, dyspnea with exercise, cough, sputum, wheezing, coughing up blood, and pleurisy. GI: Denies vomiting blood, jaundice, and fecal incontinence.   Denies dysphagia or odynophagia. GU : Denies urinary burning, blood in urine, urinary frequency, urinary hesitancy, nocturnal urination, and urinary incontinence. MS: Denies  joint pain, limitation of movement, and swelling, stiffness, low back pain, extremity pain. Denies muscle weakness, cramps, atrophy.  Derm: Denies rash, itching, dry skin, hives, moles, warts, or unhealing ulcers.  Psych: Denies depression, anxiety, memory loss, suicidal ideation, hallucinations, paranoia, and confusion. Heme: Denies bruising, bleeding, and enlarged lymph nodes. Neuro:  Denies any headaches, dizziness, paresthesias. Endo:  Denies any problems with DM, thyroid, adrenal function.   Physical Exam: General:  Alert, well-developed, in NAD Head:  Normocephalic and atraumatic. Eyes:  Sclera clear, no icterus.   Conjunctiva pink. Ears:  Normal auditory acuity. Mouth:  No deformity or lesions.  Neck:  Supple; no masses . Lungs:  Clear throughout to auscultation.   No wheezes, crackles, or rhonchi. No acute distress. Heart:  Regular rate and rhythm; no murmurs. Abdomen:  Soft, nondistended, nontender. No masses, hepatomegaly. No obvious masses.  Normal bowel .    Rectal:  Deferred   Msk:  Symmetrical without gross deformities.. Pulses:  Normal pulses noted. Extremities:  Without edema. Neurologic:  Alert and  oriented x4;  grossly normal neurologically. Skin:  Intact without significant lesions or rashes. Cervical Nodes:  No significant cervical adenopathy. Psych:  Alert and cooperative. Normal mood and affect.   Impression / Plan:   Personal history of adenomatous colon polyps for surveillance colonoscopy.  Pricilla Riffle. Fuller Plan  08/23/2021, 3:39 PM See Shea Evans,  [facility], to contact our on call provider

## 2021-08-23 NOTE — Progress Notes (Signed)
Called to room to assist during endoscopic procedure.  Patient ID and intended procedure confirmed with present staff. Received instructions for my participation in the procedure from the performing physician.  

## 2021-08-23 NOTE — Patient Instructions (Signed)
Handouts given for polyps, hemorrhoids and high fiber diet.  Continue present medications.  Await pathology results.  Surgical referral for consideration of hemorrhoidectomy.   YOU HAD AN ENDOSCOPIC PROCEDURE TODAY AT [facility]:   Refer to the procedure report that was given to you for any specific questions about what was found during the examination.  If the procedure report does not answer your questions, please call your gastroenterologist to clarify.  If you requested that your care partner not be given the details of your procedure findings, then the procedure report has been included in a sealed envelope for you to review at your convenience later.  YOU SHOULD EXPECT: Some feelings of bloating in the abdomen. Passage of more gas than usual.  Walking can help get rid of the air that was put into your GI tract during the procedure and reduce the bloating. If you had a lower endoscopy (such as a colonoscopy or flexible sigmoidoscopy) you may notice spotting of blood in your stool or on the toilet paper. If you underwent a bowel prep for your procedure, you may not have a normal bowel movement for a few days.  Please Note:  You might notice some irritation and congestion in your nose or some drainage.  This is from the oxygen used during your procedure.  There is no need for concern and it should clear up in a day or so.  SYMPTOMS TO REPORT IMMEDIATELY:  Following lower endoscopy (colonoscopy or flexible sigmoidoscopy):  Excessive amounts of blood in the stool  Significant tenderness or worsening of abdominal pains  Swelling of the abdomen that is new, acute  Fever of 100F or higher  For urgent or emergent issues, a gastroenterologist can be reached at any hour by calling [phone]. Do not use MyChart messaging for urgent concerns.    DIET:  We do recommend a small meal at first, but then you may proceed to your regular diet.  Drink plenty of fluids but you should  avoid alcoholic beverages for 24 hours.  ACTIVITY:  You should plan to take it easy for the rest of today and you should NOT DRIVE or use heavy machinery until tomorrow (because of the sedation medicines used during the test).    FOLLOW UP: Our staff will call the number listed on your records 48-72 hours following your procedure to check on you and address any questions or concerns that you may have regarding the information given to you following your procedure. If we do not reach you, we will leave a message.  We will attempt to reach you two times.  During this call, we will ask if you have developed any symptoms of COVID 19. If you develop any symptoms (ie: fever, flu-like symptoms, shortness of breath, cough etc.) before then, please call [phone].  If you test positive for Covid 19 in the 2 weeks post procedure, please call and report this information to [place].    If any biopsies were taken you will be contacted by phone or by letter within the next 1-3 weeks.  Please call us at [phone] if you have not heard about the biopsies in 3 weeks.    SIGNATURES/CONFIDENTIALITY: You and/or your care partner have signed paperwork which will be entered into your electronic medical record.  These signatures attest to the fact that that the information above on your After Visit Summary has been reviewed and is understood.  Full responsibility of the confidentiality of this discharge  information lies with you and/or your care-partner.

## 2021-08-25 ENCOUNTER — Telehealth: Payer: Self-pay

## 2021-08-25 NOTE — Telephone Encounter (Signed)
Busy signal on follow up call.

## 2021-08-25 NOTE — Telephone Encounter (Signed)
Busy signal on 2nd follow up call.

## 2021-08-26 ENCOUNTER — Ambulatory Visit (HOSPITAL_COMMUNITY)
Admission: RE | Admit: 2021-08-26 | Discharge: 2021-08-26 | Disposition: A | Discharge status: Home / Self Care | Payer: BC Managed Care – PPO | Source: Ambulatory Visit | Attending: Vascular Surgery | Admitting: Vascular Surgery

## 2021-08-26 ENCOUNTER — Ambulatory Visit (INDEPENDENT_AMBULATORY_CARE_PROVIDER_SITE_OTHER): Payer: BC Managed Care – PPO | Admitting: Vascular Surgery

## 2021-08-26 ENCOUNTER — Other Ambulatory Visit: Payer: Self-pay

## 2021-08-26 ENCOUNTER — Encounter: Payer: Self-pay | Admitting: Vascular Surgery

## 2021-08-26 VITALS — BP 111/73 | HR 86 | Temp 99.0°F | Resp 20 | Ht 70.0 in | Wt 221.0 lb

## 2021-08-26 DIAGNOSIS — I872 Venous insufficiency (chronic) (peripheral): Secondary | ICD-10-CM | POA: Diagnosis present

## 2021-08-26 DIAGNOSIS — I739 Peripheral vascular disease, unspecified: Secondary | ICD-10-CM | POA: Diagnosis not present

## 2021-08-26 NOTE — Progress Notes (Signed)
REASON FOR VISIT:   Follow-up of chronic venous insufficiency  MEDICAL ISSUES:   CHRONIC VENOUS INSUFFICIENCY: This patient's follow-up reflux study is fairly unremarkable.  He does have some superficial venous reflux on the left in the great saphenous vein however the vein is not especially dilated.  He feels that his swelling and symptoms are improved in both legs.  I have encouraged him to continue to elevate his legs daily.  We have also discussed the importance of exercise.  He will continue to wear his knee-high compression stockings and knows to get new ones if they get stretched out.  I have encouraged him to avoid prolonged sitting and standing.  If his symptoms progress in the future and I be happy to see him back at any time.  I again reassured him that he has no evidence of significant arterial insufficiency.  HPI:   Alejandro [name] is a pleasant 61 y.o. male this is a patient who I last saw on 08/13/2020 with chronic venous insufficiency.  He has CEAP C4a venous disease.  We discussed conservative measures including elevation, compression, exercise, and avoiding prolonged sitting and standing.  I recommended formal repeat venous reflux testing.  He comes in for that study today.  I had previously seen him on 05/06/2020 for evaluation of peripheral arterial disease.  His noninvasive studies at that time showed normal arterial flow bilaterally.  At that time I had recommended thigh-high compression stockings with a gradient of 20 to 30 mmHg.   Since I saw him last, he states that his leg pain and leg swelling have improved.  He denies any claudication, rest pain, or nonhealing ulcers.  He has had no previous history of DVT.  Past Medical History:  Diagnosis Date   Blood transfusion without reported diagnosis    AS A BABY   Diabetes mellitus without complication ([place])    Fatty liver    Hepatitis C    Hyperlipidemia    Hypertension    Tubular adenoma of colon 12/2015     Family History  Problem Relation Age of Onset   Heart disease Mother 83       CAD/AMI   Diabetes Mother    Hypertension Mother    Diabetes Father    Diabetes Sister    Hypertension Sister    Hypertension Brother    Diabetes Brother    Diabetes Maternal Grandfather    Diabetes Paternal Grandfather    Colon cancer Neg Hx    Esophageal cancer Neg Hx    Stomach cancer Neg Hx    Rectal cancer Neg Hx    Colon polyps Neg Hx     SOCIAL HISTORY: Social History   Tobacco Use   Smoking status: Former   Smokeless tobacco: Never  Substance Use Topics   Alcohol use: Yes    Comment: occasional wine    Allergies  Allergen Reactions   Tylenol [Acetaminophen] Hypertension    Current Outpatient Medications  Medication Sig Dispense Refill   amLODipine (NORVASC) 10 MG tablet Take 1 tablet (10 mg total) by mouth daily. 90 tablet 3   atorvastatin (LIPITOR) 40 MG tablet Take 1 tablet (40 mg total) by mouth daily. 90 tablet 2   Blood Glucose Monitoring Suppl (ONE TOUCH ULTRA 2) w/Device KIT USE AS DIRECTED TO TEST BLOOD SUGAR IN THE MORNING 1 each 0   Blood Glucose Monitoring Suppl KIT 1 application by Does not apply route every morning. 1 kit 3   Blood  Pressure Monitoring (BLOOD PRESSURE CUFF) MISC Use as directed 1 each 0   Dulaglutide (TRULICITY) 1.5 EG/3.1DV SOPN Inject 1.5 mg into the skin once a week. 0.5 mL 10   fluticasone (FLONASE) 50 MCG/ACT nasal spray USE 1 TO 2 SPRAY(S) IN EACH NOSTRIL ONCE DAILY 16 g 0   gabapentin (NEURONTIN) 400 MG capsule Take 1 capsule (400 mg total) by mouth 3 (three) times daily. 90 capsule 3   glucose blood (ONETOUCH VERIO) test strip USE ONE STRIP TO CHECK GLUCOSE TWICE DAILY 150 each 6   hydrochlorothiazide (HYDRODIURIL) 25 MG tablet Take 1 tablet (25 mg total) by mouth daily. 90 tablet 1   metFORMIN (GLUCOPHAGE) 1000 MG tablet Take 1 tablet (1,000 mg total) by mouth 2 (two) times daily with a meal. 90 tablet 6   valsartan (DIOVAN) 80 MG tablet  Take 1 tablet (80 mg total) by mouth daily. 90 tablet 3   No current facility-administered medications for this visit.    REVIEW OF SYSTEMS:  $RemoveB'[X]'ojVeouUs$  denotes positive finding, $RemoveBeforeDEI'[ ]'[id number]$  denotes negative finding Cardiac  Comments:  Chest pain or chest pressure:    Shortness of breath upon exertion:    Short of breath when lying flat:    Irregular heart rhythm:        Vascular    Pain in calf, thigh, or hip brought on by ambulation:    Pain in feet at night that wakes you up from your sleep:     Blood clot in your veins:    Leg swelling:         Pulmonary    Oxygen at home:    Productive cough:     Wheezing:         Neurologic    Sudden weakness in arms or legs:     Sudden numbness in arms or legs:     Sudden onset of difficulty speaking or slurred speech:    Temporary loss of vision in one eye:     Problems with dizziness:         Gastrointestinal    Blood in stool:     Vomited blood:         Genitourinary    Burning when urinating:     Blood in urine:        Psychiatric    Major depression:         Hematologic    Bleeding problems:    Problems with blood clotting too easily:        Skin    Rashes or ulcers:        Constitutional    Fever or chills:     PHYSICAL EXAM:   Vitals:   08/26/21 1549  BP: 111/73  Pulse: 86  Resp: 20  Temp: 99 F (37.2 C)  SpO2: 95%  Weight: 221 lb (100.2 kg)  Height: $Remove'5\' 10"'DPXVwof$  (1.778 m)    GENERAL: The patient is a well-nourished male, in no acute distress. The vital signs are documented above. CARDIAC: There is a regular rate and rhythm.  VASCULAR: I do not detect carotid bruits. He has palpable femoral, popliteal, and dorsalis pedis pulses bilaterally. He has no significant lower extremity swelling. He does have hyperpigmentation bilaterally. PULMONARY: There is good air exchange bilaterally without wheezing or rales. ABDOMEN: Soft and non-tender with normal pitched bowel sounds.  MUSCULOSKELETAL: There are no major deformities  or cyanosis. NEUROLOGIC: No focal weakness or paresthesias are detected. SKIN: There are no ulcers or rashes noted.  PSYCHIATRIC: The patient has a normal affect.  DATA:    VENOUS DUPLEX: I have independently interpreted his venous duplex scan today.  On the left side, there is no evidence of DVT.  There is no deep venous reflux.  There is superficial venous reflux in the left great saphenous vein from the proximal thigh to the knee.  Diameters of the vein ranged from 3.2-3.8 mm.  There is no reflux at the saphenofemoral junction.     On the right side there is no evidence of DVT.  There is no deep venous reflux.  There is no superficial venous reflux.    Deitra Mayo [facility] [phone]

## 2021-09-01 ENCOUNTER — Ambulatory Visit (HOSPITAL_COMMUNITY)
Admission: EM | Admit: 2021-09-01 | Discharge: 2021-09-01 | Disposition: A | Discharge status: Home / Self Care | Payer: BC Managed Care – PPO | Attending: Internal Medicine | Admitting: Internal Medicine

## 2021-09-01 ENCOUNTER — Other Ambulatory Visit: Payer: Self-pay

## 2021-09-01 ENCOUNTER — Encounter (HOSPITAL_COMMUNITY): Payer: Self-pay | Admitting: Emergency Medicine

## 2021-09-01 DIAGNOSIS — T148XXA Other injury of unspecified body region, initial encounter: Secondary | ICD-10-CM

## 2021-09-01 MED ORDER — CYCLOBENZAPRINE HCL 5 MG PO TABS: 5.0000 mg | ORAL_TABLET | Freq: Three times a day (TID) | ORAL | 0 refills | Status: DC | PRN

## 2021-09-01 MED ORDER — NAPROXEN 375 MG PO TABS: 375.0000 mg | ORAL_TABLET | Freq: Two times a day (BID) | ORAL | 0 refills | Status: DC

## 2021-09-01 MED ORDER — KETOROLAC TROMETHAMINE 30 MG/ML IJ SOLN
30.0000 mg | Freq: Once | INTRAMUSCULAR | Status: AC
  Administered 2021-09-01: 30 mg via INTRAMUSCULAR

## 2021-09-01 MED ORDER — KETOROLAC TROMETHAMINE 30 MG/ML IJ SOLN
INTRAMUSCULAR | Status: AC
  Filled 2021-09-01: qty 1

## 2021-09-01 NOTE — ED Triage Notes (Signed)
Pt reports that for over week when bends over has pain in back. Has pain in back when wakes up in mornings.

## 2021-09-01 NOTE — Discharge Instructions (Addendum)
Please see Dr. Delfina Redwood if this treatment doesn't take care of your symptoms.

## 2021-09-02 NOTE — ED Provider Notes (Signed)
[facility]    CSN: [id number] Arrival date & time: 09/01/21  1618      History   Chief Complaint Chief Complaint  Patient presents with   Back Pain    HPI Alejandro [name] is a 61 y.o. male.  Patient reports bilateral lower back pain for the last week.  Pain is worse when he wakes up in the morning, and then eases off after he starts moving and going about his day.  He reports pain is worse when he gets out of his truck or when he bends over at the waist to pick something up.  Sometimes feels the pain around into his abdomen.  Denies heartburn, dyspepsia, nausea, vomiting, dizziness, chest pain, shortness of breath, numbness, tingling.  He reports the pain does not radiate anywhere other than from his bilateral lower back sometimes around to his abdomen when he bends over.  His daughter looked up his symptoms on the Internet and wonders if he has GERD.  He denies specific injury.  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   Back Pain Associated symptoms: abdominal pain   Associated symptoms: no chest pain, no fever, no numbness and no weakness    Past Medical History:  Diagnosis Date   Blood transfusion without reported diagnosis    AS A BABY   Diabetes mellitus without complication ([facility])    Fatty liver    Hepatitis C    Hyperlipidemia    Hypertension    Tubular adenoma of colon 12/2015    Patient Active Problem List   Diagnosis Date Noted   PVD (peripheral vascular disease) ([place]) 08/19/2020   Chronic venous insufficiency 05/19/2020   Essential hypertension 03/11/2017   Type 2 diabetes mellitus with hyperglycemia, without long-term current use of insulin ([place]) 09/14/2014   Gallstones 09/14/2014   Chronic hepatitis C virus infection ([place]) 12/01/2008   ANEMIA, IRON DEFICIENCY 12/01/2008   BLOOD IN STOOL 12/01/2008    Past Surgical History:  Procedure Laterality Date    COLONOSCOPY     POLYPECTOMY         Home Medications    Prior to Admission medications   Medication Sig Start Date End Date Taking? Authorizing Provider  cyclobenzaprine (FLEXERIL) 5 MG tablet Take 1 tablet (5 mg total) by mouth 3 (three) times daily as needed for muscle spasms. 09/01/21  Yes Carvel Getting, NP  naproxen (NAPROSYN) 375 MG tablet Take 1 tablet (375 mg total) by mouth 2 (two) times daily. 09/01/21  Yes Carvel Getting, NP  amLODipine (NORVASC) 10 MG tablet Take 1 tablet (10 mg total) by mouth daily. 08/19/20   Just, Laurita Quint, FNP  atorvastatin (LIPITOR) 40 MG tablet Take 1 tablet (40 mg total) by mouth daily. 08/19/20   Just, Laurita Quint, FNP  Blood Glucose Monitoring Suppl (ONE TOUCH ULTRA 2) w/Device KIT USE AS DIRECTED TO TEST BLOOD SUGAR IN THE MORNING 08/07/16   Ivar Drape D, PA  Blood Glucose Monitoring Suppl KIT 1 application by Does not apply route every morning. 04/30/20   Daleen Squibb, MD  Blood Pressure Monitoring (BLOOD PRESSURE CUFF) MISC Use as directed 07/07/17   Shawnee Knapp, MD  Dulaglutide (TRULICITY) 1.5 LY/6.5KP SOPN Inject 1.5 mg into the skin once a week. 08/19/20   Just, Laurita Quint, FNP  fluticasone (FLONASE) 50 MCG/ACT nasal spray USE 1 TO 2 SPRAY(S) IN  EACH NOSTRIL ONCE DAILY 03/05/20   Jacelyn Pi, Lilia Argue, MD  gabapentin (NEURONTIN) 400 MG capsule Take 1 capsule (400 mg total) by mouth 3 (three) times daily. 08/19/20   Just, Laurita Quint, FNP  glucose blood (ONETOUCH VERIO) test strip USE ONE STRIP TO CHECK GLUCOSE TWICE DAILY 04/23/19   Jacelyn Pi, Lilia Argue, MD  hydrochlorothiazide (HYDRODIURIL) 25 MG tablet Take 1 tablet (25 mg total) by mouth daily. 08/19/20   Just, Laurita Quint, FNP  metFORMIN (GLUCOPHAGE) 1000 MG tablet Take 1 tablet (1,000 mg total) by mouth 2 (two) times daily with a meal. 08/19/20   Just, Laurita Quint, FNP  valsartan (DIOVAN) 80 MG tablet Take 1 tablet (80 mg total) by mouth daily. 08/19/20   Just, Laurita Quint, FNP    Family  History Family History  Problem Relation Age of Onset   Heart disease Mother 13       CAD/AMI   Diabetes Mother    Hypertension Mother    Diabetes Father    Diabetes Sister    Hypertension Sister    Hypertension Brother    Diabetes Brother    Diabetes Maternal Grandfather    Diabetes Paternal Grandfather    Colon cancer Neg Hx    Esophageal cancer Neg Hx    Stomach cancer Neg Hx    Rectal cancer Neg Hx    Colon polyps Neg Hx     Social History Social History   Tobacco Use   Smoking status: Former   Smokeless tobacco: Never  Scientific laboratory technician Use: Never used  Substance Use Topics   Alcohol use: Yes    Comment: occasional wine   Drug use: No     Allergies   Tylenol [acetaminophen]   Review of Systems Review of Systems  Constitutional:  Negative for chills and fever.  Respiratory:  Negative for shortness of breath.   Cardiovascular:  Negative for chest pain.  Gastrointestinal:  Positive for abdominal pain. Negative for nausea and vomiting.  Musculoskeletal:  Positive for back pain.  Neurological:  Negative for dizziness, weakness and numbness.    Physical Exam Triage Vital Signs ED Triage Vitals  Enc Vitals Group     BP 09/01/21 1706 116/70     Pulse Rate 09/01/21 1706 82     Resp 09/01/21 1706 17     Temp 09/01/21 1706 98.4 F (36.9 C)     Temp Source 09/01/21 1706 Oral     SpO2 09/01/21 1706 99 %     Weight --      Height --      Head Circumference --      Peak Flow --      Pain Score 09/01/21 1747 8     Pain Loc --      Pain Edu? --      Excl. in [place]? --    No data found.  Updated Vital Signs BP 116/70 (BP Location: Right Arm)    Pulse 82    Temp 98.4 F (36.9 C) (Oral)    Resp 17    SpO2 99%   Visual Acuity Right Eye Distance:   Left Eye Distance:   Bilateral Distance:    Right Eye Near:   Left Eye Near:    Bilateral Near:     Physical Exam Constitutional:      General: He is not in acute distress.    Appearance: Normal  appearance. He is not ill-appearing.  Neck:  Vascular: No carotid bruit.  Cardiovascular:     Rate and Rhythm: Normal rate and regular rhythm.  Pulmonary:     Effort: Pulmonary effort is normal.     Breath sounds: Normal breath sounds.  Abdominal:     General: Abdomen is flat. Bowel sounds are normal. There is no distension.     Tenderness: There is no abdominal tenderness. There is no guarding or rebound.  Musculoskeletal:     Lumbar back: No tenderness or bony tenderness. Normal range of motion. Negative right straight leg raise test and negative left straight leg raise test.  Neurological:     Mental Status: He is alert.     UC Treatments / Results  Labs (all labs ordered are listed, but only abnormal results are displayed) Labs Reviewed - No data to display  EKG   Radiology No results found.  Procedures Procedures (including critical care time)  Medications Ordered in UC Medications  ketorolac (TORADOL) 30 MG/ML injection 30 mg (30 mg Intramuscular Given 09/01/21 1746)    Initial Impression / Assessment and Plan / UC Course  I have reviewed the triage vital signs and the nursing notes.  Pertinent labs & imaging results that were available during my care of the patient were reviewed by me and considered in my medical decision making (see chart for details).    Symptoms appear to be consistent with muscle strain.  Will prescribe naproxen and cyclobenzaprine.  I do not suspect any GI cause of his symptoms nor do I think his symptoms are related to cardiopulmonary function.  Discussed use of prescribed medications.  When patient demonstrates pain with bending over to pick something up, he demonstrates poor body mechanics.  Shared proper better body mechanic techniques with patient.  If his symptoms do not improve with this treatment plan, patient is to follow-up with his PCP.  Final Clinical Impressions(s) / UC Diagnoses   Final diagnoses:  Muscle strain      Discharge Instructions      Please see Dr. Delfina Redwood if this treatment doesn't take care of your symptoms.    ED Prescriptions     Medication Sig Dispense Auth. Provider   naproxen (NAPROSYN) 375 MG tablet Take 1 tablet (375 mg total) by mouth 2 (two) times daily. 20 tablet Carvel Getting, NP   cyclobenzaprine (FLEXERIL) 5 MG tablet Take 1 tablet (5 mg total) by mouth 3 (three) times daily as needed for muscle spasms. 21 tablet Carvel Getting, NP      PDMP not reviewed this encounter.   Carvel Getting, NP 09/02/21 [phone]

## 2021-09-03 ENCOUNTER — Encounter: Payer: Self-pay | Admitting: Gastroenterology

## 2023-01-14 ENCOUNTER — Ambulatory Visit (HOSPITAL_COMMUNITY)
Admission: EM | Admit: 2023-01-14 | Discharge: 2023-01-14 | Disposition: A | Discharge status: Home / Self Care | Payer: BC Managed Care – PPO | Attending: Emergency Medicine | Admitting: Emergency Medicine

## 2023-01-14 ENCOUNTER — Encounter (HOSPITAL_COMMUNITY): Payer: Self-pay

## 2023-01-14 DIAGNOSIS — M545 Low back pain, unspecified: Secondary | ICD-10-CM

## 2023-01-14 MED ORDER — KETOROLAC TROMETHAMINE 30 MG/ML IJ SOLN
30.0000 mg | Freq: Once | INTRAMUSCULAR | Status: AC
  Administered 2023-01-14: 30 mg via INTRAMUSCULAR

## 2023-01-14 MED ORDER — KETOROLAC TROMETHAMINE 30 MG/ML IJ SOLN
INTRAMUSCULAR | Status: AC
  Filled 2023-01-14: qty 1

## 2023-01-14 MED ORDER — NAPROXEN SODIUM 550 MG PO TABS: 550.0000 mg | ORAL_TABLET | Freq: Two times a day (BID) | ORAL | 0 refills | Status: DC

## 2023-01-14 MED ORDER — CYCLOBENZAPRINE HCL 5 MG PO TABS: 5.0000 mg | ORAL_TABLET | Freq: Three times a day (TID) | ORAL | 0 refills | Status: AC | PRN

## 2023-01-14 NOTE — Discharge Instructions (Signed)
Your pain is most likely caused by irritation to the muscles or ligaments.   Been given an injection of Toradol today here in the office, daily will start to see some relief in about 30 minutes to an hour, this medicine helps to reduce inflammation which in turn helps with pain  Starting tomorrow take naproxen every morning and every evening for 5 days to continue the above process they may use as needed  You may use muscle relaxer every 8 hours for additional comfort, be mindful this medicine may make you feel drowsy  You may use heating pad in 15 minute intervals as needed for additional comfort,  you may find comfort in using ice in 10-15 minutes over affected area  Begin stretching affected area daily for 10 minutes as tolerated to further loosen muscles   When lying down place pillow underneath and between knees for support  Can try sleeping without pillow on firm mattress   Practice good posture: head back, shoulders back, chest forward, pelvis back and weight distributed evenly on both legs  If pain persist after recommended treatment or reoccurs if may be beneficial to follow up with orthopedic specialist for evaluation, this doctor specializes in the bones and can manage your symptoms long-term with options such as but not limited to imaging, medications or physical therapy

## 2023-01-14 NOTE — ED Provider Notes (Signed)
[facility]    CSN: [id number] Arrival date & time: 01/14/23  1048      History   Chief Complaint Chief Complaint  Patient presents with   Back Pain    HPI Alejandro [name] is a 62 y.o. male.   Patient presents for evaluation of centralized lower back pain present for 4 days.  Pain has been constant, does not radiate.  Denies numbness or tingling.  Has attempted use of Advil which has been ineffective.  Denies injury or trauma but was moving light boxes prior to symptoms beginning.  During this timeframe also traveled down here and slept on her mattress.  Had similar symptoms in the past.  History of diabetes.  Past Medical History:  Diagnosis Date   Blood transfusion without reported diagnosis    AS A BABY   Diabetes mellitus without complication ([facility])    Fatty liver    Hepatitis C    Hyperlipidemia    Hypertension    Tubular adenoma of colon 12/2015    Patient Active Problem List   Diagnosis Date Noted   PVD (peripheral vascular disease) ([facility]) 08/19/2020   Chronic venous insufficiency 05/19/2020   Essential hypertension 03/11/2017   Type 2 diabetes mellitus with hyperglycemia, without long-term current use of insulin ([facility]) 09/14/2014   Gallstones 09/14/2014   Chronic hepatitis C virus infection ([facility]) 12/01/2008   ANEMIA, IRON DEFICIENCY 12/01/2008   BLOOD IN STOOL 12/01/2008    Past Surgical History:  Procedure Laterality Date   COLONOSCOPY     POLYPECTOMY         Home Medications    Prior to Admission medications   Medication Sig Start Date End Date Taking? Authorizing Provider  amLODipine (NORVASC) 10 MG tablet Take 1 tablet (10 mg total) by mouth daily. 08/19/20  Yes Just, Azalee Course, FNP  atorvastatin (LIPITOR) 40 MG tablet Take 1 tablet (40 mg total) by mouth daily. 08/19/20  Yes Just, Azalee Course, FNP  Dulaglutide (TRULICITY) 1.5 MG/0.5ML SOPN Inject 1.5 mg into the skin once a week. 08/19/20  Yes Just, Azalee Course, FNP  fluticasone (FLONASE) 50  MCG/ACT nasal spray USE 1 TO 2 SPRAY(S) IN EACH NOSTRIL ONCE DAILY 03/05/20  Yes Lezlie Lye, Meda Coffee, MD  gabapentin (NEURONTIN) 400 MG capsule Take 1 capsule (400 mg total) by mouth 3 (three) times daily. 08/19/20  Yes Just, Azalee Course, FNP  hydrochlorothiazide (HYDRODIURIL) 25 MG tablet Take 1 tablet (25 mg total) by mouth daily. 08/19/20  Yes Just, Azalee Course, FNP  metFORMIN (GLUCOPHAGE) 1000 MG tablet Take 1 tablet (1,000 mg total) by mouth 2 (two) times daily with a meal. 08/19/20  Yes Just, Azalee Course, FNP  naproxen (NAPROSYN) 375 MG tablet Take 1 tablet (375 mg total) by mouth 2 (two) times daily. 09/01/21  Yes Cathlyn Parsons, NP  valsartan (DIOVAN) 80 MG tablet Take 1 tablet (80 mg total) by mouth daily. 08/19/20  Yes Just, Azalee Course, FNP  Blood Glucose Monitoring Suppl (ONE TOUCH ULTRA 2) w/Device KIT USE AS DIRECTED TO TEST BLOOD SUGAR IN THE MORNING 08/07/16   Trena Platt D, PA  Blood Glucose Monitoring Suppl KIT 1 application by Does not apply route every morning. 04/30/20   Noni Saupe, MD  Blood Pressure Monitoring (BLOOD PRESSURE CUFF) MISC Use as directed 07/07/17   Sherren Mocha, MD  cyclobenzaprine (FLEXERIL) 5 MG tablet Take 1 tablet (5 mg total) by mouth 3 (three) times daily as needed for muscle spasms.  09/01/21   Cathlyn Parsons, NP  glucose blood (ONETOUCH VERIO) test strip USE ONE STRIP TO CHECK GLUCOSE TWICE DAILY 04/23/19   Lezlie Lye, Meda Coffee, MD    Family History Family History  Problem Relation Age of Onset   Heart disease Mother 60       CAD/AMI   Diabetes Mother    Hypertension Mother    Diabetes Father    Diabetes Sister    Hypertension Sister    Hypertension Brother    Diabetes Brother    Diabetes Maternal Grandfather    Diabetes Paternal Grandfather    Colon cancer Neg Hx    Esophageal cancer Neg Hx    Stomach cancer Neg Hx    Rectal cancer Neg Hx    Colon polyps Neg Hx     Social History Social History   Tobacco Use   Smoking status: Former    Smokeless tobacco: Never  Building services engineer Use: Never used  Substance Use Topics   Alcohol use: Yes    Comment: occasional wine   Drug use: No     Allergies   Tylenol [acetaminophen]   Review of Systems Review of Systems  Musculoskeletal:  Positive for back pain.     Physical Exam Triage Vital Signs ED Triage Vitals  Enc Vitals Group     BP 01/14/23 1152 128/82     Pulse Rate 01/14/23 1152 76     Resp 01/14/23 1152 18     Temp 01/14/23 1152 98.2 F (36.8 C)     Temp Source 01/14/23 1152 Oral     SpO2 01/14/23 1152 98 %     Weight 01/14/23 1151 215 lb (97.5 kg)     Height 01/14/23 1151 5\' 10"  (1.778 m)     Head Circumference --      Peak Flow --      Pain Score 01/14/23 1150 9     Pain Loc --      Pain Edu? --      Excl. in [place]? --    No data found.  Updated Vital Signs BP 128/82 (BP Location: Right Arm)   Pulse 76   Temp 98.2 F (36.8 C) (Oral)   Resp 18   Ht 5\' 10"  (1.778 m)   Wt 215 lb (97.5 kg)   SpO2 98%   BMI 30.85 kg/m   Visual Acuity Right Eye Distance:   Left Eye Distance:   Bilateral Distance:    Right Eye Near:   Left Eye Near:    Bilateral Near:     Physical Exam Constitutional:      Appearance: Normal appearance.  Eyes:     Extraocular Movements: Extraocular movements intact.  Pulmonary:     Effort: Pulmonary effort is normal.  Musculoskeletal:     Comments: Tenderness is generalized to the lower abdomen, negative straight leg test, able to twist turn and pain  Neurological:     Mental Status: He is alert and oriented to person, place, and time. Mental status is at baseline.      UC Treatments / Results  Labs (all labs ordered are listed, but only abnormal results are displayed) Labs Reviewed - No data to display  EKG   Radiology No results found.  Procedures Procedures (including critical care time)  Medications Ordered in UC Medications - No data to display  Initial Impression / Assessment and Plan / UC  Course  I have reviewed the triage vital signs and  the nursing notes.  Pertinent labs & imaging results that were available during my care of the patient were reviewed by me and considered in my medical decision making (see chart for details).  Acute bilateral low back pain without sciatica  Etiology is most likely muscular, low suspicion for spinal involvement therefore imaging deferred, given Toradol injection in office and prescribed naproxen and Flexeril for outpatient basis patient has not success with these medications in the past, recommended RICE heat massage stretching and activity as tolerated, given walker referral to orthopedics if symptoms continue to persist or worsen Final Clinical Impressions(s) / UC Diagnoses   Final diagnoses:  None   Discharge Instructions   None    ED Prescriptions   None    PDMP not reviewed this encounter.   Valinda Hoar, [place] 01/14/23 1242

## 2023-01-14 NOTE — ED Triage Notes (Signed)
Mid lower back pain x4 days. Patient states he is diabetic and drinks water. Was helping someone move but no heavy lifting or falls. Patient did sleep on an air mattress when he was helping.   No pain with urination, no urinary symptoms.

## 2023-09-25 ENCOUNTER — Inpatient Hospital Stay (HOSPITAL_COMMUNITY)
Admission: EM | Admit: 2023-09-25 | Discharge: 2023-09-28 | DRG: 393 | Disposition: A | Discharge status: Home / Self Care | Attending: Internal Medicine | Admitting: Internal Medicine

## 2023-09-25 ENCOUNTER — Emergency Department (HOSPITAL_COMMUNITY)

## 2023-09-25 ENCOUNTER — Other Ambulatory Visit: Payer: Self-pay

## 2023-09-25 ENCOUNTER — Encounter (HOSPITAL_COMMUNITY): Payer: Self-pay

## 2023-09-25 DIAGNOSIS — Z886 Allergy status to analgesic agent status: Secondary | ICD-10-CM

## 2023-09-25 DIAGNOSIS — K922 Gastrointestinal hemorrhage, unspecified: Secondary | ICD-10-CM

## 2023-09-25 DIAGNOSIS — K760 Fatty (change of) liver, not elsewhere classified: Secondary | ICD-10-CM | POA: Diagnosis present

## 2023-09-25 DIAGNOSIS — D631 Anemia in chronic kidney disease: Secondary | ICD-10-CM | POA: Diagnosis present

## 2023-09-25 DIAGNOSIS — B182 Chronic viral hepatitis C: Secondary | ICD-10-CM | POA: Diagnosis present

## 2023-09-25 DIAGNOSIS — K648 Other hemorrhoids: Principal | ICD-10-CM

## 2023-09-25 DIAGNOSIS — Z85038 Personal history of other malignant neoplasm of large intestine: Secondary | ICD-10-CM

## 2023-09-25 DIAGNOSIS — E785 Hyperlipidemia, unspecified: Secondary | ICD-10-CM | POA: Diagnosis present

## 2023-09-25 DIAGNOSIS — I959 Hypotension, unspecified: Secondary | ICD-10-CM | POA: Diagnosis present

## 2023-09-25 DIAGNOSIS — K449 Diaphragmatic hernia without obstruction or gangrene: Secondary | ICD-10-CM | POA: Diagnosis present

## 2023-09-25 DIAGNOSIS — Z7985 Long-term (current) use of injectable non-insulin antidiabetic drugs: Secondary | ICD-10-CM

## 2023-09-25 DIAGNOSIS — Z833 Family history of diabetes mellitus: Secondary | ICD-10-CM | POA: Diagnosis not present

## 2023-09-25 DIAGNOSIS — K644 Residual hemorrhoidal skin tags: Secondary | ICD-10-CM | POA: Diagnosis present

## 2023-09-25 DIAGNOSIS — K5731 Diverticulosis of large intestine without perforation or abscess with bleeding: Secondary | ICD-10-CM | POA: Diagnosis present

## 2023-09-25 DIAGNOSIS — Z79899 Other long term (current) drug therapy: Secondary | ICD-10-CM

## 2023-09-25 DIAGNOSIS — D649 Anemia, unspecified: Principal | ICD-10-CM | POA: Diagnosis present

## 2023-09-25 DIAGNOSIS — K5900 Constipation, unspecified: Secondary | ICD-10-CM | POA: Diagnosis present

## 2023-09-25 DIAGNOSIS — K921 Melena: Secondary | ICD-10-CM | POA: Diagnosis present

## 2023-09-25 DIAGNOSIS — Z7984 Long term (current) use of oral hypoglycemic drugs: Secondary | ICD-10-CM | POA: Diagnosis not present

## 2023-09-25 DIAGNOSIS — E11649 Type 2 diabetes mellitus with hypoglycemia without coma: Secondary | ICD-10-CM | POA: Diagnosis present

## 2023-09-25 DIAGNOSIS — N179 Acute kidney failure, unspecified: Secondary | ICD-10-CM | POA: Diagnosis present

## 2023-09-25 DIAGNOSIS — Z87891 Personal history of nicotine dependence: Secondary | ICD-10-CM | POA: Diagnosis not present

## 2023-09-25 DIAGNOSIS — I10 Essential (primary) hypertension: Secondary | ICD-10-CM | POA: Diagnosis present

## 2023-09-25 DIAGNOSIS — Z860101 Personal history of adenomatous and serrated colon polyps: Secondary | ICD-10-CM | POA: Diagnosis not present

## 2023-09-25 DIAGNOSIS — E1165 Type 2 diabetes mellitus with hyperglycemia: Secondary | ICD-10-CM | POA: Diagnosis present

## 2023-09-25 DIAGNOSIS — E162 Hypoglycemia, unspecified: Secondary | ICD-10-CM | POA: Diagnosis not present

## 2023-09-25 DIAGNOSIS — Z8249 Family history of ischemic heart disease and other diseases of the circulatory system: Secondary | ICD-10-CM

## 2023-09-25 DIAGNOSIS — G4733 Obstructive sleep apnea (adult) (pediatric): Secondary | ICD-10-CM | POA: Diagnosis present

## 2023-09-25 DIAGNOSIS — D62 Acute posthemorrhagic anemia: Secondary | ICD-10-CM | POA: Diagnosis present

## 2023-09-25 DIAGNOSIS — D509 Iron deficiency anemia, unspecified: Secondary | ICD-10-CM | POA: Diagnosis not present

## 2023-09-25 LAB — BASIC METABOLIC PANEL
Anion gap: 12 (ref 5–15)
BUN: 21 mg/dL (ref 8–23)
CO2: 23 mmol/L (ref 22–32)
Calcium: 9.4 mg/dL (ref 8.9–10.3)
Chloride: 100 mmol/L (ref 98–111)
Creatinine, Ser: 1.49 mg/dL — ABNORMAL HIGH (ref 0.61–1.24)
GFR, Estimated: 52 mL/min — ABNORMAL LOW (ref >60)
Glucose, Bld: 81 mg/dL (ref 70–99)
Potassium: 4.5 mmol/L (ref 3.5–5.1)
Sodium: 135 mmol/L (ref 135–145)

## 2023-09-25 LAB — CBC WITH DIFFERENTIAL/PLATELET
Abs Immature Granulocytes: 0.09 10*3/uL — ABNORMAL HIGH (ref 0.00–0.07)
Basophils Absolute: 0 10*3/uL (ref 0.0–0.1)
Basophils Relative: 0 %
Eosinophils Absolute: 0 10*3/uL (ref 0.0–0.5)
Eosinophils Relative: 0 %
HCT: 17.6 % — ABNORMAL LOW (ref 39.0–52.0)
Hemoglobin: 5.4 g/dL — CL (ref 13.0–17.0)
Immature Granulocytes: 1 %
Lymphocytes Relative: 18 %
Lymphs Abs: 1.7 10*3/uL (ref 0.7–4.0)
MCH: 28 pg (ref 26.0–34.0)
MCHC: 30.7 g/dL (ref 30.0–36.0)
MCV: 91.2 fL (ref 80.0–100.0)
Monocytes Absolute: 0.7 10*3/uL (ref 0.1–1.0)
Monocytes Relative: 8 %
Neutro Abs: 7.1 10*3/uL (ref 1.7–7.7)
Neutrophils Relative %: 73 %
Platelets: 335 10*3/uL (ref 150–400)
RBC: 1.93 MIL/uL — ABNORMAL LOW (ref 4.22–5.81)
RDW: 14.9 % (ref 11.5–15.5)
WBC: 9.7 10*3/uL (ref 4.0–10.5)
nRBC: 0.2 % (ref 0.0–0.2)

## 2023-09-25 LAB — IRON AND TIBC
Iron: 15 ug/dL — ABNORMAL LOW (ref 45–182)
Saturation Ratios: 4 % — ABNORMAL LOW (ref 17.9–39.5)
TIBC: 379 ug/dL (ref 250–450)
UIBC: 364 ug/dL

## 2023-09-25 LAB — CK: Total CK: 215 U/L (ref 49–397)

## 2023-09-25 LAB — CBG MONITORING, ED
Glucose-Capillary: 68 mg/dL — ABNORMAL LOW (ref 70–99)
Glucose-Capillary: 198 mg/dL — ABNORMAL HIGH (ref 70–99)

## 2023-09-25 LAB — TSH: TSH: 3.099 u[IU]/mL (ref 0.350–4.500)

## 2023-09-25 LAB — HEPATIC FUNCTION PANEL
ALT: 14 U/L (ref 0–44)
AST: 22 U/L (ref 15–41)
Albumin: 3.7 g/dL (ref 3.5–5.0)
Alkaline Phosphatase: 41 U/L (ref 38–126)
Bilirubin, Direct: <0.1 mg/dL (ref 0.0–0.2)
Total Bilirubin: 0.6 mg/dL (ref 0.0–1.2)
Total Protein: 6.7 g/dL (ref 6.5–8.1)

## 2023-09-25 LAB — FOLATE: Folate: 27.7 ng/mL (ref >5.9)

## 2023-09-25 LAB — RETICULOCYTES
Immature Retic Fract: 41.9 % — ABNORMAL HIGH (ref 2.3–15.9)
RBC.: 1.78 MIL/uL — ABNORMAL LOW (ref 4.22–5.81)
Retic Count, Absolute: 110.2 10*3/uL (ref 19.0–186.0)
Retic Ct Pct: 6.2 % — ABNORMAL HIGH (ref 0.4–3.1)

## 2023-09-25 LAB — VITAMIN B12: Vitamin B-12: 457 pg/mL (ref 180–914)

## 2023-09-25 LAB — OSMOLALITY: Osmolality: 297 mosm/kg — ABNORMAL HIGH (ref 275–295)

## 2023-09-25 LAB — MAGNESIUM: Magnesium: 1.9 mg/dL (ref 1.7–2.4)

## 2023-09-25 LAB — FERRITIN: Ferritin: 4 ng/mL — ABNORMAL LOW (ref 24–336)

## 2023-09-25 LAB — ABO/RH: ABO/RH(D): O NEG

## 2023-09-25 LAB — POC OCCULT BLOOD, ED: Fecal Occult Bld: NEGATIVE

## 2023-09-25 LAB — PREPARE RBC (CROSSMATCH)

## 2023-09-25 LAB — PROTIME-INR
INR: 1 (ref 0.8–1.2)
Prothrombin Time: 13.6 s (ref 11.4–15.2)

## 2023-09-25 LAB — HEMOGLOBIN A1C
Hgb A1c MFr Bld: 5.7 % — ABNORMAL HIGH (ref 4.8–5.6)
Mean Plasma Glucose: 116.89 mg/dL

## 2023-09-25 LAB — PHOSPHORUS: Phosphorus: 4.5 mg/dL (ref 2.5–4.6)

## 2023-09-25 MED ORDER — HYDROXYZINE HCL 10 MG PO TABS: 10.0000 mg | ORAL_TABLET | Freq: Three times a day (TID) | ORAL | Status: DC | PRN

## 2023-09-25 MED ORDER — SODIUM CHLORIDE 0.9 % IV BOLUS
500.0000 mL | Freq: Once | INTRAVENOUS | Status: AC
  Administered 2023-09-25: 500 mL via INTRAVENOUS

## 2023-09-25 MED ORDER — ONDANSETRON HCL 4 MG/2ML IJ SOLN: 4.0000 mg | Freq: Four times a day (QID) | INTRAMUSCULAR | Status: DC | PRN

## 2023-09-25 MED ORDER — ONDANSETRON HCL 4 MG PO TABS: 4.0000 mg | ORAL_TABLET | Freq: Four times a day (QID) | ORAL | Status: DC | PRN

## 2023-09-25 MED ORDER — SODIUM CHLORIDE 0.9% IV SOLUTION: Freq: Once | INTRAVENOUS | Status: DC

## 2023-09-25 MED ORDER — SODIUM CHLORIDE 0.9 % IV SOLN
INTRAVENOUS | Status: DC
Start: 2023-09-25 — End: 2023-09-26

## 2023-09-25 MED ORDER — SODIUM CHLORIDE 0.9% IV SOLUTION: Freq: Once | INTRAVENOUS | Status: AC

## 2023-09-25 MED ORDER — INSULIN ASPART 100 UNIT/ML IJ SOLN: 0.0000 [IU] | INTRAMUSCULAR | Status: DC

## 2023-09-25 MED ORDER — PANTOPRAZOLE SODIUM 40 MG IV SOLR
40.0000 mg | Freq: Two times a day (BID) | INTRAVENOUS | Status: DC
  Administered 2023-09-25 – 2023-09-27 (×5): 40 mg via INTRAVENOUS
  Filled 2023-09-25 (×5): qty 10

## 2023-09-25 MED ORDER — IOHEXOL 350 MG/ML SOLN
75.0000 mL | Freq: Once | INTRAVENOUS | Status: AC | PRN
  Administered 2023-09-25: 75 mL via INTRAVENOUS

## 2023-09-25 NOTE — ED Provider Notes (Signed)
 [place] [facility] DEPARTMENT AT [facility] Provider Note   CSN: [id number] Arrival date & time: 09/25/23  1120     History  Chief Complaint  Patient presents with   Fatigue   Hypotension    Alejandro [name] is a 63 y.o. male.  Pt is a 63 yo male with pmhx significant for hep c, htn, dm, colon cancer, and hld.  Pt said he's not been feeling well for the past few days.  He's been more tired.  He has noticed some dark stool and has had some bleeding hemorrhoids.  Pt does see Dr. Russella Dar ([facility]) and last had a colonoscopy on 08/23/21.  Results:  - Hemorrhoids found on perianal exam.                           - Three 5 to 7 mm polyps in the transverse colon,                            removed with a cold snare. Resected and retrieved.                           - One 8 mm polyp in the descending colon, removed                            with a cold snare. Resected and retrieved.                           - Moderate diverticulosis in the left colon.                           - Internal hemorrhoids.                           - The examination was otherwise normal on direct                            and retroflexion views.         Home Medications Prior to Admission medications   Medication Sig Start Date End Date Taking? Authorizing Provider  amLODipine (NORVASC) 10 MG tablet Take 1 tablet (10 mg total) by mouth daily. 08/19/20   Just, Azalee Course, FNP  atorvastatin (LIPITOR) 40 MG tablet Take 1 tablet (40 mg total) by mouth daily. 08/19/20   Just, Azalee Course, FNP  Blood Glucose Monitoring Suppl (ONE TOUCH ULTRA 2) w/Device KIT USE AS DIRECTED TO TEST BLOOD SUGAR IN THE MORNING 08/07/16   Trena Platt D, PA  Blood Glucose Monitoring Suppl KIT 1 application by Does not apply route every morning. 04/30/20   Noni Saupe, MD  Blood Pressure Monitoring (BLOOD PRESSURE CUFF) MISC Use as directed 07/07/17   Sherren Mocha, MD  cyclobenzaprine (FLEXERIL) 5 MG tablet Take  1 tablet (5 mg total) by mouth 3 (three) times daily as needed for muscle spasms. 01/14/23   White, Elita Boone, NP  Dulaglutide (TRULICITY) 1.5 MG/0.5ML SOPN Inject 1.5 mg into the skin once a week. 08/19/20   Just, Azalee Course, FNP  fluticasone (FLONASE) 50 MCG/ACT nasal spray USE 1 TO 2 SPRAY(S) IN EACH NOSTRIL ONCE DAILY  03/05/20   Noni Saupe, MD  gabapentin (NEURONTIN) 400 MG capsule Take 1 capsule (400 mg total) by mouth 3 (three) times daily. 08/19/20   Just, Azalee Course, FNP  glucose blood (ONETOUCH VERIO) test strip USE ONE STRIP TO CHECK GLUCOSE TWICE DAILY 04/23/19   Lezlie Lye, Meda Coffee, MD  hydrochlorothiazide (HYDRODIURIL) 25 MG tablet Take 1 tablet (25 mg total) by mouth daily. 08/19/20   Just, Azalee Course, FNP  metFORMIN (GLUCOPHAGE) 1000 MG tablet Take 1 tablet (1,000 mg total) by mouth 2 (two) times daily with a meal. 08/19/20   Just, Azalee Course, FNP  naproxen (NAPROSYN) 375 MG tablet Take 1 tablet (375 mg total) by mouth 2 (two) times daily. 09/01/21   Cathlyn Parsons, NP  naproxen sodium (ANAPROX DS) 550 MG tablet Take 1 tablet (550 mg total) by mouth 2 (two) times daily with a meal. 01/14/23   White, Elita Boone, NP  valsartan (DIOVAN) 80 MG tablet Take 1 tablet (80 mg total) by mouth daily. 08/19/20   Just, Azalee Course, FNP      Allergies    Tylenol [acetaminophen]    Review of Systems   Review of Systems  Gastrointestinal:  Positive for anal bleeding.  All other systems reviewed and are negative.   Physical Exam Updated Vital Signs BP (!) 140/75 (BP Location: Right Arm)   Pulse 96   Temp 97.9 F (36.6 C)   Resp 18   Ht 5\' 9"  (1.753 m)   Wt 54.4 kg   SpO2 99%   BMI 17.72 kg/m  Physical Exam Vitals and nursing note reviewed. Exam conducted with a chaperone present.  Constitutional:      Appearance: Normal appearance.  HENT:     Head: Normocephalic and atraumatic.     Right Ear: External ear normal.     Left Ear: External ear normal.     Nose: Nose normal.      Mouth/Throat:     Mouth: Mucous membranes are moist.     Pharynx: Oropharynx is clear.  Eyes:     Extraocular Movements: Extraocular movements intact.     Conjunctiva/sclera: Conjunctivae normal.     Pupils: Pupils are equal, round, and reactive to light.     Comments: Pale conjunctiva  Cardiovascular:     Rate and Rhythm: Normal rate and regular rhythm.     Pulses: Normal pulses.     Heart sounds: Normal heart sounds.  Pulmonary:     Effort: Pulmonary effort is normal.     Breath sounds: Normal breath sounds.  Abdominal:     General: Abdomen is flat. Bowel sounds are normal.     Palpations: Abdomen is soft.  Genitourinary:    Rectum: Guaiac result negative. External hemorrhoid present.  Musculoskeletal:        General: Normal range of motion.     Cervical back: Normal range of motion and neck supple.  Skin:    General: Skin is warm.     Capillary Refill: Capillary refill takes less than 2 seconds.  Neurological:     General: No focal deficit present.     Mental Status: He is alert and oriented to person, place, and time.  Psychiatric:        Mood and Affect: Mood normal.        Behavior: Behavior normal.     ED Results / Procedures / Treatments   Labs (all labs ordered are listed, but only abnormal results are displayed) Labs Reviewed  CBC WITH DIFFERENTIAL/PLATELET - Abnormal; Notable for the following components:      Result Value   RBC 1.93 (*)    Hemoglobin 5.4 (*)    HCT 17.6 (*)    Abs Immature Granulocytes 0.09 (*)    All other components within normal limits  BASIC METABOLIC PANEL - Abnormal; Notable for the following components:   Creatinine, Ser 1.49 (*)    GFR, Estimated 52 (*)    All other components within normal limits  RETICULOCYTES - Abnormal; Notable for the following components:   Retic Ct Pct 6.2 (*)    RBC. 1.78 (*)    Immature Retic Fract 41.9 (*)    All other components within normal limits  PROTIME-INR  HEPATIC FUNCTION PANEL  VITAMIN  B12  FOLATE  IRON AND TIBC  FERRITIN  POC OCCULT BLOOD, ED  TYPE AND SCREEN  PREPARE RBC (CROSSMATCH)  ABO/RH    EKG EKG Interpretation Date/Time:  Monday September 25 2023 12:49:31 EST Ventricular Rate:  91 PR Interval:  132 QRS Duration:  84 QT Interval:  352 QTC Calculation: 432 R Axis:   20  Text Interpretation: Normal sinus rhythm Normal ECG When compared with ECG of 21-Jun-2020 17:37, PREVIOUS ECG IS PRESENT No significant change since last tracing Confirmed by Jacalyn Lefevre [phone]) on 09/25/2023 3:29:58 PM  Radiology DG Chest 2 View Result Date: 09/25/2023 CLINICAL DATA:  Fatigue. EXAM: CHEST - 2 VIEW COMPARISON:  Chest radiograph dated 08/02/2018. FINDINGS: The heart size and mediastinal contours are within normal limits. Stable calcified nodule in the left upper lobe, adjacent to the left hilum. No focal consolidation, pleural effusion, or pneumothorax. Degenerative changes of the thoracic spine. No acute osseous abnormality. IMPRESSION: No acute cardiopulmonary findings. Electronically Signed   By: Hart Robinsons M.D.   On: 09/25/2023 16:04    Procedures Procedures    Medications Ordered in ED Medications  0.9 %  sodium chloride infusion (Manually program via Guardrails IV Fluids) (has no administration in time range)  sodium chloride 0.9 % bolus 500 mL (0 mLs Intravenous Stopped 09/25/23 1606)    ED Course/ Medical Decision Making/ A&P                                 Medical Decision Making Amount and/or Complexity of Data Reviewed Labs: ordered. Radiology: ordered.  Risk Prescription drug management.   This patient presents to the ED for concern of fatigue, this involves an extensive number of treatment options, and is a complaint that carries with it a high risk of complications and morbidity.  The differential diagnosis includes anemia, infection, electrolyte abn, dehydration   Co morbidities that complicate the patient evaluation  hep c, htn, dm, colon  cancer, and hld   Additional history obtained:  Additional history obtained from epic chart review  Lab Tests:  I Ordered, and personally interpreted labs.  The pertinent results include:  cbc with hgb low at 5.4 (CBC 13 in 2021); bmp with cr elevated at 1.49 (1.06 in 2021); lfts nl   Imaging Studies ordered:  I ordered imaging studies including cxr and ct abd/pelvis  I independently visualized and interpreted imaging which showed  CXR: No acute cardiopulmonary findings.  I agree with the radiologist interpretation   Cardiac Monitoring:  The patient was maintained on a cardiac monitor.  I personally viewed and interpreted the cardiac monitored which showed an underlying rhythm of: nsr   Medicines  ordered and prescription drug management:  I ordered medication including transfusion  for anemia  Reevaluation of the patient after these medicines showed that the patient improved I have reviewed the patients home medicines and have made adjustments as needed   Test Considered:  ct   Critical Interventions:  transfusion   Problem List / ED Course:  Symptomatic anemia:  unclear etiology.  Likely GI.  Transfusion ordered.  Pt will need admission.   Reevaluation:  After the interventions noted above, I reevaluated the patient and found that they have :improved   Social Determinants of Health:  Lives at home   Dispostion:  After consideration of the diagnostic results and the patients response to treatment, I feel that the patent would benefit from admission.    CRITICAL CARE Performed by: Jacalyn Lefevre   Total critical care time: 30 minutes  Critical care time was exclusive of separately billable procedures and treating other patients.  Critical care was necessary to treat or prevent imminent or life-threatening deterioration.  Critical care was time spent personally by me on the following activities: development of treatment plan with patient and/or  surrogate as well as nursing, discussions with consultants, evaluation of patient's response to treatment, examination of patient, obtaining history from patient or surrogate, ordering and performing treatments and interventions, ordering and review of laboratory studies, ordering and review of radiographic studies, pulse oximetry and re-evaluation of patient's condition.        Final Clinical Impression(s) / ED Diagnoses Final diagnoses:  Symptomatic anemia    Rx / DC Orders ED Discharge Orders     None         Jacalyn Lefevre, MD 09/25/23 1745

## 2023-09-25 NOTE — H&P (Signed)
 Alejandro [name] UJW:[id number] DOB: 1961/06/12 DOA: 09/25/2023     PCP: Renford Dills, MD   Outpatient Specialists:    GI Dr. Russella Dar ( [facility])   Patient arrived to ER on 09/25/23 at 1120 Referred by Attending Gloris Manchester, MD   Patient coming from:    home Lives With family    Chief Complaint:   Chief Complaint  Patient presents with   Fatigue   Hypotension    HPI: Alejandro [name] is a 63 y.o. male with medical history significant of hemorrhoids, HTN HLD, DM2    Presented with fatigue and blood per rectum Patient presents with his significant tiredness fatigue noted his blood pressure was 111/70 which is low for him usually he is hypertensive feels very tired has been feeling like this for the past week noticed that he have had some blood in his stool which is maroon he has history of hemorrhoids and noticed some blood clots in the stool as well He sees Dr. Russella Dar with [facility] last colonoscopy was on 2023 Found to have hemoglobin of 5.7    Denies significant ETOH intake    Does not smoke   Lab Results  Component Value Date   SARSCOV2NAA NOT DETECTED 09/08/2019        Regarding pertinent Chronic problems:     Hyperlipidemia - on statins Lipitor (atorvastatin)  Lipid Panel     Component Value Date/Time   CHOL 127 04/30/2020 1606   TRIG 134 04/30/2020 1606   HDL 49 04/30/2020 1606   CHOLHDL 2.6 04/30/2020 1606   CHOLHDL 3.6 11/12/2015 1208   VLDL 48 (H) 11/12/2015 1208   LDLCALC 55 04/30/2020 1606   LABVLDL 23 04/30/2020 1606     HTN on Norvasc, hydrochlorothiazide, Diovan         DM 2 -  Lab Results  Component Value Date   HGBA1C 8.3 (A) 08/19/2020   on Trulicity and metformin       OSA -not on CPAP    While in ER:   Found to have hemoglobin of 5.4 Blood per rectum On exam external hemorrhoids noted Also found to be in AKI with creatinine up to 1.5  Lab Orders         CBC with Differential         Basic metabolic panel          Protime-INR         Hepatic function panel         Vitamin B12         Folate         Iron and TIBC         Ferritin         Reticulocytes         POC occult blood, ED Provider will collect       CXR -  NON acute  CTabd/pelvis - No evidence of active GI bleeding.   Extensive sigmoid diverticulosis. No evidence of diverticulitis    Following Medications were ordered in ER: Medications  pantoprazole (PROTONIX) injection 40 mg (has no administration in time range)  0.9 %  sodium chloride infusion (Manually program via Guardrails IV Fluids) ( Intravenous New Bag/Given 09/25/23 1804)  sodium chloride 0.9 % bolus 500 mL (0 mLs Intravenous Stopped 09/25/23 1606)  iohexol (OMNIPAQUE) 350 MG/ML injection 75 mL (75 mLs Intravenous Contrast Given 09/25/23 1802)    _______________________________________________________ ER Provider Called:    [facility]  Dr  Barron Alvine They Recommend admit to medicine   Will see in AM   NPO pMN     ED Triage Vitals  Encounter Vitals Group     BP 09/25/23 1128 101/67     Systolic BP Percentile --      Diastolic BP Percentile --      Pulse Rate 09/25/23 1128 97     Resp 09/25/23 1128 18     Temp 09/25/23 1128 97.8 F (36.6 C)     Temp Source 09/25/23 1128 Oral     SpO2 09/25/23 1128 99 %     Weight 09/25/23 1218 120 lb (54.4 kg)     Height 09/25/23 1218 5\' 9"  (1.753 m)     Head Circumference --      Peak Flow --      Pain Score 09/25/23 1218 0     Pain Loc --      Pain Education --      Exclude from Growth Chart --   WGNF(62)@     _________________________________________ Significant initial  Findings: Abnormal Labs Reviewed  CBC WITH DIFFERENTIAL/PLATELET - Abnormal; Notable for the following components:      Result Value   RBC 1.93 (*)    Hemoglobin 5.4 (*)    HCT 17.6 (*)    Abs Immature Granulocytes 0.09 (*)    All other components within normal limits  BASIC METABOLIC PANEL - Abnormal; Notable for the following components:   Creatinine,  Ser 1.49 (*)    GFR, Estimated 52 (*)    All other components within normal limits  IRON AND TIBC - Abnormal; Notable for the following components:   Iron 15 (*)    Saturation Ratios 4 (*)    All other components within normal limits  FERRITIN - Abnormal; Notable for the following components:   Ferritin 4 (*)    All other components within normal limits  RETICULOCYTES - Abnormal; Notable for the following components:   Retic Ct Pct 6.2 (*)    RBC. 1.78 (*)    Immature Retic Fract 41.9 (*)    All other components within normal limits      _________________________ Troponin   Cardiac Panel (last 3 results) No results for input(s): "CKTOTAL", "CKMB", "TROPONINIHS", "RELINDX" in the last 72 hours.   ECG: Ordered Personally reviewed and interpreted by me showing: HR : 91 Rhythm:  NSR    no evidence of ischemic changes QTC 432  BNP (last 3 results) No results for input(s): "BNP" in the last 8760 hours.   COVID-19 Labs  Recent Labs    09/25/23 1542  FERRITIN 4*    Lab Results  Component Value Date   SARSCOV2NAA NOT DETECTED 09/08/2019    The recent clinical data is shown below. Vitals:   09/25/23 1424 09/25/23 1800 09/25/23 1808 09/25/23 1929  BP: (!) 140/75 125/74 126/82 125/79  Pulse: 96 96 92 88  Resp: 18 (!) 28 18 18   Temp: 97.9 F (36.6 C) 98.4 F (36.9 C) 99 F (37.2 C) 98.3 F (36.8 C)  TempSrc:  Oral Oral Oral  SpO2: 99%  100% 100%  Weight:      Height:        WBC     Component Value Date/Time   WBC 9.7 09/25/2023 1235   LYMPHSABS 1.7 09/25/2023 1235   MONOABS 0.7 09/25/2023 1235   EOSABS 0.0 09/25/2023 1235   BASOSABS 0.0 09/25/2023 1235     UA ordered   ___________________________________________________  Recent Labs  Lab 09/25/23 1235  NA 135  K 4.5  CO2 23  GLUCOSE 81  BUN 21  CREATININE 1.49*  CALCIUM 9.4    Cr Up from baseline see below Lab Results  Component Value Date   CREATININE 1.49 (H) 09/25/2023   CREATININE 1.06  06/22/2020   CREATININE 1.10 06/21/2020    Recent Labs  Lab 09/25/23 1542  AST 22  ALT 14  ALKPHOS 41  BILITOT 0.6  PROT 6.7  ALBUMIN 3.7   Lab Results  Component Value Date   CALCIUM 9.4 09/25/2023    Plt: Lab Results  Component Value Date   PLT 335 09/25/2023       Recent Labs  Lab 09/25/23 1235  WBC 9.7  NEUTROABS 7.1  HGB 5.4*  HCT 17.6*  MCV 91.2  PLT 335    HG/HCT  Down  from baseline see below    Component Value Date/Time   HGB 5.4 (LL) 09/25/2023 1235   HGB 14.5 10/30/2019 1608   HCT 17.6 (L) 09/25/2023 1235   HCT 44.7 10/30/2019 1608   MCV 91.2 09/25/2023 1235   MCV 86 10/30/2019 1608     ____________________________________________ Hospitalist was called for admission for   Symptomatic anemia GI bleed   The following Work up has been ordered so far:  Orders Placed This Encounter  Procedures   DG Chest 2 View   CT Angio Abd/Pel W and/or Wo Contrast   CBC with Differential   Basic metabolic panel   Protime-INR   Hepatic function panel   Vitamin B12   Folate   Iron and TIBC   Ferritin   Reticulocytes   Diet NPO time specified   Orthostatic Vital signs   Initiate Carrier Fluid Protocol   ED Cardiac monitoring   Informed Consent Details: Physician/Practitioner Attestation; Transcribe to consent form and obtain patient signature   Consult to gastroenterology  [facility]   Consult to hospitalist   POC occult blood, ED Provider will collect   ED EKG   Type and screen [facility]   Prepare RBC (crossmatch)   ABO/Rh     OTHER Significant initial  Findings:  labs showing:     DM  labs:  HbA1C: Recent Labs    09/25/23 1542  HGBA1C 5.7*      CBG (last 3)  Recent Labs    09/25/23 2048  GLUCAP 68*        Cultures: No results found for: "SDES", "SPECREQUEST", "CULT", "REPTSTATUS"   Radiological Exams on Admission: CT Angio Abd/Pel W and/or Wo Contrast Result Date: 09/25/2023 CLINICAL DATA:  Lower GI bleed.  Low blood pressures. Lethargy. Blood in stool. EXAM: CTA ABDOMEN AND PELVIS WITHOUT AND WITH CONTRAST TECHNIQUE: Multidetector CT imaging of the abdomen and pelvis was performed using the standard protocol during bolus administration of intravenous contrast. Multiplanar reconstructed images and MIPs were obtained and reviewed to evaluate the vascular anatomy. RADIATION DOSE REDUCTION: This exam was performed according to the departmental dose-optimization program which includes automated exposure control, adjustment of the mA and/or kV according to patient size and/or use of iterative reconstruction technique. CONTRAST:  75mL OMNIPAQUE IOHEXOL 350 MG/ML SOLN COMPARISON:  Abdominal ultrasound 01/11/2017 FINDINGS: VASCULAR Scattered calcified atherosclerotic plaque the aorta and its mesenteric, renal, and iliac artery branches without hemodynamically significant stenosis. No aortic aneurysm or dissection. Review of the MIP images confirms the above findings. NON-VASCULAR Lower chest: No acute abnormality. Hepatobiliary: Cholelithiasis. No evidence of acute cholecystitis. Unremarkable liver.  Pancreas: Unremarkable. Spleen: Unremarkable. Adrenals/Urinary Tract: Normal adrenal glands. No urinary calculi or hydronephrosis. Unremarkable bladder. Stomach/Bowel: Small hiatal hernia. Stomach is otherwise within normal limits. No bowel obstruction. Extensive sigmoid diverticulosis. No evidence of diverticulitis. No evidence of active GI bleeding. Normal appendix. Lymphatic: No lymphadenopathy. Reproductive: No acute abnormality. Other: No free intraperitoneal fluid or air. Musculoskeletal: No acute fracture. IMPRESSION: 1. No evidence of active GI bleeding. 2. Extensive sigmoid diverticulosis. No evidence of diverticulitis. 3. Cholelithiasis. No evidence of acute cholecystitis. 4. Aortic Atherosclerosis (ICD10-I70.0). Electronically Signed   By: Minerva Fester M.D.   On: 09/25/2023 21:22   DG Chest 2 View Result Date:  09/25/2023 CLINICAL DATA:  Fatigue. EXAM: CHEST - 2 VIEW COMPARISON:  Chest radiograph dated 08/02/2018. FINDINGS: The heart size and mediastinal contours are within normal limits. Stable calcified nodule in the left upper lobe, adjacent to the left hilum. No focal consolidation, pleural effusion, or pneumothorax. Degenerative changes of the thoracic spine. No acute osseous abnormality. IMPRESSION: No acute cardiopulmonary findings. Electronically Signed   By: Hart Robinsons M.D.   On: 09/25/2023 16:04   _______________________________________________________________________________________________________ Latest  Blood pressure 125/79, pulse 88, temperature 98.3 F (36.8 C), temperature source Oral, resp. rate 18, height 5\' 9"  (1.753 m), weight 54.4 kg, SpO2 100%.   Vitals  labs and radiology finding personally reviewed  Review of Systems:    Pertinent positives include: lightheaded fatigue, Constitutional:  No weight loss, night sweats, Fevers, chills,  weight loss  HEENT:  No headaches, Difficulty swallowing,Tooth/dental problems,Sore throat,  No sneezing, itching, ear ache, nasal congestion, post nasal drip,  Cardio-vascular:  No chest pain, Orthopnea, PND, anasarca, dizziness, palpitations.no Bilateral lower extremity swelling  GI:  No heartburn, indigestion, abdominal pain, nausea, vomiting, diarrhea, change in bowel habits, loss of appetite, melena, blood in stool, hematemesis Resp:  no shortness of breath at rest. No dyspnea on exertion, No excess mucus, no productive cough, No non-productive cough, No coughing up of blood.No change in color of mucus.No wheezing. Skin:  no rash or lesions. No jaundice GU:  no dysuria, change in color of urine, no urgency or frequency. No straining to urinate.  No flank pain.  Musculoskeletal:  No joint pain or no joint swelling. No decreased range of motion. No back pain.  Psych:  No change in mood or affect. No depression or anxiety. No memory  loss.  Neuro: no localizing neurological complaints, no tingling, no weakness, no double vision, no gait abnormality, no slurred speech, no confusion  All systems reviewed and apart from HOPI all are negative _______________________________________________________________________________________________ Past Medical History:   Past Medical History:  Diagnosis Date   Blood transfusion without reported diagnosis    AS A BABY   Diabetes mellitus without complication (HCC)    Fatty liver    Hepatitis C    Hyperlipidemia    Hypertension    Tubular adenoma of colon 12/2015     Past Surgical History:  Procedure Laterality Date   COLONOSCOPY     POLYPECTOMY      Social History:  Ambulatory   independently       reports that he has quit smoking. He has never used smokeless tobacco. He reports current alcohol use. He reports that he does not use drugs.   Family History:   Family History  Problem Relation Age of Onset   Heart disease Mother 17       CAD/AMI   Diabetes Mother    Hypertension Mother    Diabetes Father  Diabetes Sister    Hypertension Sister    Hypertension Brother    Diabetes Brother    Diabetes Maternal Grandfather    Diabetes Paternal Grandfather    Colon cancer Neg Hx    Esophageal cancer Neg Hx    Stomach cancer Neg Hx    Rectal cancer Neg Hx    Colon polyps Neg Hx    ______________________________________________________________________________________________ Allergies: Allergies  Allergen Reactions   Tylenol [Acetaminophen] Hypertension     Prior to Admission medications   Medication Sig Start Date End Date Taking? Authorizing Provider  amLODipine (NORVASC) 10 MG tablet Take 1 tablet (10 mg total) by mouth daily. 08/19/20   Just, Azalee Course, FNP  atorvastatin (LIPITOR) 40 MG tablet Take 1 tablet (40 mg total) by mouth daily. 08/19/20   Just, Azalee Course, FNP  Blood Glucose Monitoring Suppl (ONE TOUCH ULTRA 2) w/Device KIT USE AS DIRECTED TO TEST  BLOOD SUGAR IN THE MORNING 08/07/16   Trena Platt D, PA  Blood Glucose Monitoring Suppl KIT 1 application by Does not apply route every morning. 04/30/20   Noni Saupe, MD  Blood Pressure Monitoring (BLOOD PRESSURE CUFF) MISC Use as directed 07/07/17   Sherren Mocha, MD  cyclobenzaprine (FLEXERIL) 5 MG tablet Take 1 tablet (5 mg total) by mouth 3 (three) times daily as needed for muscle spasms. 01/14/23   White, Elita Boone, NP  Dulaglutide (TRULICITY) 1.5 MG/0.5ML SOPN Inject 1.5 mg into the skin once a week. 08/19/20   Just, Azalee Course, FNP  fluticasone (FLONASE) 50 MCG/ACT nasal spray USE 1 TO 2 SPRAY(S) IN EACH NOSTRIL ONCE DAILY 03/05/20   Lezlie Lye, Meda Coffee, MD  gabapentin (NEURONTIN) 400 MG capsule Take 1 capsule (400 mg total) by mouth 3 (three) times daily. 08/19/20   Just, Azalee Course, FNP  glucose blood (ONETOUCH VERIO) test strip USE ONE STRIP TO CHECK GLUCOSE TWICE DAILY 04/23/19   Lezlie Lye, Meda Coffee, MD  hydrochlorothiazide (HYDRODIURIL) 25 MG tablet Take 1 tablet (25 mg total) by mouth daily. 08/19/20   Just, Azalee Course, FNP  metFORMIN (GLUCOPHAGE) 1000 MG tablet Take 1 tablet (1,000 mg total) by mouth 2 (two) times daily with a meal. 08/19/20   Just, Azalee Course, FNP  naproxen (NAPROSYN) 375 MG tablet Take 1 tablet (375 mg total) by mouth 2 (two) times daily. 09/01/21   Cathlyn Parsons, NP  naproxen sodium (ANAPROX DS) 550 MG tablet Take 1 tablet (550 mg total) by mouth 2 (two) times daily with a meal. 01/14/23   White, Elita Boone, NP  valsartan (DIOVAN) 80 MG tablet Take 1 tablet (80 mg total) by mouth daily. 08/19/20   Just, Azalee Course, FNP    ___________________________________________________________________________________________________ Physical Exam:    09/25/2023    7:29 PM 09/25/2023    6:08 PM 09/25/2023    6:00 PM  Vitals with BMI  Systolic 125 126 161  Diastolic 79 82 74  Pulse 88 92 96     1. General:  in No  Acute distress   Chronically ill   -appearing 2.  Psychological: Alert and   Oriented 3. Head/ENT:    Dry Mucous Membranes                          Head Non traumatic, neck supple  Poor Dentition 4. SKIN:  decreased Skin turgor,  Skin clean Dry and intact no rash    5. Heart: Regular rate and rhythm no  Murmur, no Rub or gallop 6. Lungs: no wheezes or crackles   7. Abdomen: Soft,  non-tender, Non distended   obese  bowel sounds present 8. Lower extremities: no clubbing, cyanosis, no  edema 9. Neurologically Grossly intact, moving all 4 extremities equally   10. MSK: Normal range of motion    Chart has been reviewed  ______________________________________________________________________________________________  Assessment/Plan  63 y.o. male with medical history significant of hemorrhoids, HTN HLD, DM2 history of hepatitis C   Admitted for   Symptomatic anemia  Gastrointestinal hemorrhage     Present on Admission:  Symptomatic anemia  Blood in stool  Chronic hepatitis C virus infection (HCC)  Essential hypertension  Type 2 diabetes mellitus with hyperglycemia, without long-term current use of insulin (HCC)  AKI (acute kidney injury) (HCC)  Hypoglycemia   Blood in stool - Suspect Lower Gi source but given hx of dark stool will need to eval for upper gi source as well - Admit  For further management .  painless bleeding making colitis less likely  Admit to progressive given severe anemia    -  ER  Provider spoke to gastroenterology ( [facility]) they will see patient in a.m. appreciate their consult   - serial CBC.    - Monitor for any recurrence,  evidence of hemodynamic instability or significant blood loss -  type and screen,  - Transfuse as needed for hemoglobin below 7 or <9 if evidence of significant  bleeding  - Establish at least 2 PIV and fluid resuscitate   - clear liquids for tonight keep nothing by mouth post midnight,     Chronic hepatitis C virus infection (HCC) Appreciate GI  consult Check HCV quant   Essential hypertension Allow permissive hypertension  Symptomatic anemia Transfuse 2 units and follow serial CBC  Type 2 diabetes mellitus with hyperglycemia, without long-term current use of insulin (HCC) Order sliding scale hold p.o. medications hold Trulicity  AKI (acute kidney injury) (HCC) Order your electrolytes and rehydrate  Hypoglycemia Follow CBG and treat as needed    Other plan as per orders.  DVT prophylaxis:  SCD      Code Status:    Code Status: Not on file FULL CODE  as per patient   I had personally discussed CODE STATUS with patient  ACP   none  Family Communication:   Family not at  Bedside    Diet  Diet Orders (From admission, onward)     Start     Ordered   09/25/23 1428  Diet NPO time specified  Diet effective now        09/25/23 1428            Disposition Plan:        To home once workup is complete and patient is stable   Following barriers for discharge:                            GI bleed work up is complete                            Electrolytes corrected  Anemia corrected h/H stable                                                       Will need consultants to evaluate patient prior to discharge       Consult Orders  (From admission, onward)           Start     Ordered   09/25/23 1941  Consult to hospitalist  PG SENT BY DELORIS  Once       Provider:  (Not yet assigned)  Question Answer Comment  Place call to: [facility]   Reason for Consult Admit      09/25/23 1940                               Consults called:  [facility]   Treatment Team:  Napoleon Form, MD  Admission status:  ED Disposition     ED Disposition  Admit   Condition  --   Comment  The patient appears reasonably stabilized for admission considering the current resources, flow, and capabilities available in the ED at this time, and I doubt any other [facility] requiring further  screening and/or treatment in the ED prior to admission is  present.            inpatient     I Expect 2 midnight stay secondary to severity of patient's current illness need for inpatient interventions justified by the following:    Severe lab/radiological/exam abnormalities including:    anemia  DM2   liver disease    That are currently affecting medical management.   I expect  patient to be hospitalized for 2 midnights requiring inpatient medical care.  Patient is at high risk for adverse outcome (such as loss of life or disability) if not treated.  Indication for inpatient stay as follows:   ,  inability to maintain oral hydration    Need for operative/procedural  intervention     Need for  IV fluids, IV PPI   Level of care        progressive       tele indefinitely please discontinue once patient no longer qualifies COVID-19 Labs      Lakelyn Straus 09/25/2023, 9:35 PM    [facility]     after 2 AM please page floor coverage PA If 7AM-7PM, please contact the day team taking care of the patient using [email or web address]

## 2023-09-25 NOTE — Assessment & Plan Note (Signed)
 Allow permissive hypertension

## 2023-09-25 NOTE — Subjective & Objective (Signed)
 Patient presents with his significant tiredness fatigue noted his blood pressure was 111/70 which is low for him usually he is hypertensive feels very tired has been feeling like this for the past week noticed that he have had some blood in his stool which is maroon he has history of hemorrhoids and noticed some blood clots in the stool as well He sees Dr. Russella Dar with [facility] last colonoscopy was on 2023 Found to have hemoglobin of 5.7

## 2023-09-25 NOTE — ED Triage Notes (Signed)
 Pt states his BP 111/70 which is abnormal for him. Pt says he usually have high BP. Pt says he feels tired more than usual. Pt says his symptoms started last week. Pt says he noticed blood in his stool that was dark in color. Pt said it was a clot formation and pt endorses hemorrhoids. Pt says he just feels he need to get checked.   Pt has hx polyps

## 2023-09-25 NOTE — Assessment & Plan Note (Signed)
 Order sliding scale hold p.o. medications hold Trulicity

## 2023-09-25 NOTE — Assessment & Plan Note (Signed)
 Order your electrolytes and rehydrate

## 2023-09-25 NOTE — Assessment & Plan Note (Signed)
 Transfuse 2 units and follow serial CBC

## 2023-09-25 NOTE — ED Notes (Signed)
Pt given OJ and peanut butter crackers 

## 2023-09-25 NOTE — Assessment & Plan Note (Signed)
 Appreciate GI consult Check HCV quant

## 2023-09-25 NOTE — Assessment & Plan Note (Signed)
-   Suspect Lower Gi source but given hx of dark stool will need to eval for upper gi source as well - Admit  For further management .  painless bleeding making colitis less likely  Admit to progressive given severe anemia    -  ER  Provider spoke to gastroenterology ( [facility]) they will see patient in a.m. appreciate their consult   - serial CBC.    - Monitor for any recurrence,  evidence of hemodynamic instability or significant blood loss -  type and screen,  - Transfuse as needed for hemoglobin below 7 or <9 if evidence of significant  bleeding  - Establish at least 2 PIV and fluid resuscitate   - clear liquids for tonight keep nothing by mouth post midnight,

## 2023-09-25 NOTE — Assessment & Plan Note (Signed)
 Follow CBG and treat as needed

## 2023-09-25 NOTE — ED Provider Notes (Signed)
 Care of patient assumed from Dr. Particia Nearing.  This patient presents for symptomatic anemia with GI source of blood loss.  No stool was present on DRE today.  Hemoglobin is 5.4.  2 units PRBCs ordered.  Patient also awaiting CT scan.  Plan will be for admission. Physical Exam  BP (!) 140/75 (BP Location: Right Arm)   Pulse 96   Temp 97.9 F (36.6 C)   Resp 18   Ht 5\' 9"  (1.753 m)   Wt 54.4 kg   SpO2 99%   BMI 17.72 kg/m   Physical Exam Vitals and nursing note reviewed.  Constitutional:      General: He is not in acute distress.    Appearance: Normal appearance. He is well-developed. He is not ill-appearing, toxic-appearing or diaphoretic.  HENT:     Head: Normocephalic and atraumatic.     Right Ear: External ear normal.     Left Ear: External ear normal.     Nose: Nose normal.     Mouth/Throat:     Mouth: Mucous membranes are moist.  Eyes:     Extraocular Movements: Extraocular movements intact.     Conjunctiva/sclera: Conjunctivae normal.  Cardiovascular:     Rate and Rhythm: Normal rate and regular rhythm.  Pulmonary:     Effort: Pulmonary effort is normal. No respiratory distress.  Abdominal:     General: There is no distension.     Palpations: Abdomen is soft.  Musculoskeletal:        General: No swelling. Normal range of motion.     Cervical back: Normal range of motion and neck supple.  Skin:    General: Skin is warm and dry.     Coloration: Skin is not jaundiced or pale.  Neurological:     General: No focal deficit present.     Mental Status: He is alert and oriented to person, place, and time.  Psychiatric:        Mood and Affect: Mood normal.        Behavior: Behavior normal.     Procedures  Procedures  ED Course / MDM    Medical Decision Making Amount and/or Complexity of Data Reviewed Labs: ordered. Radiology: ordered.  Risk Prescription drug management. Decision regarding hospitalization.   On assessment, patient resting comfortably.  He  remains normotensive.  Blood transfusion initiating at this time.  He does confirm recent blood in stool.  He denies any other known sources of blood loss.  CT scan showed no active bleeding.  I spoke with gastroenterologist on-call, Dr. Barron Alvine who will see the patient in consult.  He recommends n.p.o. at midnight.  Patient was admitted for further management.       Gloris Manchester, MD 09/25/23 [phone]

## 2023-09-26 DIAGNOSIS — K922 Gastrointestinal hemorrhage, unspecified: Secondary | ICD-10-CM | POA: Diagnosis not present

## 2023-09-26 DIAGNOSIS — D509 Iron deficiency anemia, unspecified: Secondary | ICD-10-CM

## 2023-09-26 DIAGNOSIS — D649 Anemia, unspecified: Secondary | ICD-10-CM | POA: Diagnosis not present

## 2023-09-26 DIAGNOSIS — E1165 Type 2 diabetes mellitus with hyperglycemia: Secondary | ICD-10-CM | POA: Diagnosis not present

## 2023-09-26 DIAGNOSIS — K921 Melena: Secondary | ICD-10-CM | POA: Diagnosis not present

## 2023-09-26 DIAGNOSIS — I10 Essential (primary) hypertension: Secondary | ICD-10-CM | POA: Diagnosis not present

## 2023-09-26 LAB — CBC
HCT: 22 % — ABNORMAL LOW (ref 39.0–52.0)
HCT: 22.2 % — ABNORMAL LOW (ref 39.0–52.0)
HCT: 25.4 % — ABNORMAL LOW (ref 39.0–52.0)
HCT: 27.6 % — ABNORMAL LOW (ref 39.0–52.0)
Hemoglobin: 7 g/dL — ABNORMAL LOW (ref 13.0–17.0)
Hemoglobin: 7.1 g/dL — ABNORMAL LOW (ref 13.0–17.0)
Hemoglobin: 8.4 g/dL — ABNORMAL LOW (ref 13.0–17.0)
Hemoglobin: 9 g/dL — ABNORMAL LOW (ref 13.0–17.0)
MCH: 28 pg (ref 26.0–34.0)
MCH: 27.7 pg (ref 26.0–34.0)
MCH: 28.5 pg (ref 26.0–34.0)
MCH: 28.3 pg (ref 26.0–34.0)
MCHC: 31.8 g/dL (ref 30.0–36.0)
MCHC: 32 g/dL (ref 30.0–36.0)
MCHC: 33.1 g/dL (ref 30.0–36.0)
MCHC: 32.6 g/dL (ref 30.0–36.0)
MCV: 88 fL (ref 80.0–100.0)
MCV: 86.7 fL (ref 80.0–100.0)
MCV: 86.1 fL (ref 80.0–100.0)
MCV: 86.8 fL (ref 80.0–100.0)
Platelets: 315 10*3/uL (ref 150–400)
Platelets: 318 10*3/uL (ref 150–400)
Platelets: 319 10*3/uL (ref 150–400)
Platelets: 333 10*3/uL (ref 150–400)
RBC: 2.5 MIL/uL — ABNORMAL LOW (ref 4.22–5.81)
RBC: 2.56 MIL/uL — ABNORMAL LOW (ref 4.22–5.81)
RBC: 2.95 MIL/uL — ABNORMAL LOW (ref 4.22–5.81)
RBC: 3.18 MIL/uL — ABNORMAL LOW (ref 4.22–5.81)
RDW: 14.5 % (ref 11.5–15.5)
RDW: 14.5 % (ref 11.5–15.5)
RDW: 14.4 % (ref 11.5–15.5)
RDW: 14.4 % (ref 11.5–15.5)
WBC: 8.2 10*3/uL (ref 4.0–10.5)
WBC: 7.9 10*3/uL (ref 4.0–10.5)
WBC: 7.6 10*3/uL (ref 4.0–10.5)
WBC: 7.5 10*3/uL (ref 4.0–10.5)
nRBC: 0.2 % (ref 0.0–0.2)
nRBC: 0 % (ref 0.0–0.2)
nRBC: 0 % (ref 0.0–0.2)
nRBC: 0 % (ref 0.0–0.2)

## 2023-09-26 LAB — COMPREHENSIVE METABOLIC PANEL
ALT: 11 U/L (ref 0–44)
AST: 21 U/L (ref 15–41)
Albumin: 3.2 g/dL — ABNORMAL LOW (ref 3.5–5.0)
Alkaline Phosphatase: 37 U/L — ABNORMAL LOW (ref 38–126)
Anion gap: 10 (ref 5–15)
BUN: 17 mg/dL (ref 8–23)
CO2: 23 mmol/L (ref 22–32)
Calcium: 8.7 mg/dL — ABNORMAL LOW (ref 8.9–10.3)
Chloride: 102 mmol/L (ref 98–111)
Creatinine, Ser: 1.19 mg/dL (ref 0.61–1.24)
GFR, Estimated: >60 mL/min (ref >60)
Glucose, Bld: 78 mg/dL (ref 70–99)
Potassium: 4.2 mmol/L (ref 3.5–5.1)
Sodium: 135 mmol/L (ref 135–145)
Total Bilirubin: 0.8 mg/dL (ref 0.0–1.2)
Total Protein: 5.9 g/dL — ABNORMAL LOW (ref 6.5–8.1)

## 2023-09-26 LAB — URINALYSIS, COMPLETE (UACMP) WITH MICROSCOPIC
Bilirubin Urine: NEGATIVE
Glucose, UA: NEGATIVE mg/dL
Hgb urine dipstick: NEGATIVE
Ketones, ur: NEGATIVE mg/dL
Nitrite: NEGATIVE
Protein, ur: NEGATIVE mg/dL
Specific Gravity, Urine: 1.023 (ref 1.005–1.030)
pH: 6 (ref 5.0–8.0)

## 2023-09-26 LAB — CBG MONITORING, ED
Glucose-Capillary: 154 mg/dL — ABNORMAL HIGH (ref 70–99)
Glucose-Capillary: 76 mg/dL (ref 70–99)
Glucose-Capillary: 141 mg/dL — ABNORMAL HIGH (ref 70–99)
Glucose-Capillary: 137 mg/dL — ABNORMAL HIGH (ref 70–99)

## 2023-09-26 LAB — PHOSPHORUS: Phosphorus: 4.5 mg/dL (ref 2.5–4.6)

## 2023-09-26 LAB — OSMOLALITY, URINE: Osmolality, Ur: 432 mosm/kg (ref 300–900)

## 2023-09-26 LAB — MAGNESIUM: Magnesium: 2 mg/dL (ref 1.7–2.4)

## 2023-09-26 LAB — GLUCOSE, CAPILLARY
Glucose-Capillary: 147 mg/dL — ABNORMAL HIGH (ref 70–99)
Glucose-Capillary: 75 mg/dL (ref 70–99)
Glucose-Capillary: 115 mg/dL — ABNORMAL HIGH (ref 70–99)

## 2023-09-26 LAB — CREATININE, URINE, RANDOM: Creatinine, Urine: 64 mg/dL

## 2023-09-26 LAB — SODIUM, URINE, RANDOM: Sodium, Ur: 107 mmol/L

## 2023-09-26 LAB — PREPARE RBC (CROSSMATCH)

## 2023-09-26 LAB — HIV ANTIBODY (ROUTINE TESTING W REFLEX): HIV Screen 4th Generation wRfx: NONREACTIVE

## 2023-09-26 MED ORDER — SODIUM CHLORIDE 0.9% IV SOLUTION: Freq: Once | INTRAVENOUS | Status: AC

## 2023-09-26 MED ORDER — NA SULFATE-K SULFATE-MG SULF 17.5-3.13-1.6 GM/177ML PO SOLN
0.5000 | Freq: Once | ORAL | Status: AC
  Administered 2023-09-26: 177 mL via ORAL
  Filled 2023-09-26: qty 1

## 2023-09-26 MED ORDER — PEG 3350-KCL-NA BICARB-NACL 420 G PO SOLR
4000.0000 mL | Freq: Once | ORAL | Status: AC
  Administered 2023-09-26: 4000 mL via ORAL
  Filled 2023-09-26: qty 4000

## 2023-09-26 NOTE — Plan of Care (Signed)

## 2023-09-26 NOTE — Consult Note (Addendum)
 Consultation Note   Referring Provider:  [facility] PCP: Renford Dills, MD Primary Gastroenterologist:   Claudette Head, MD   Reason for Consultation: GI bleed DOA: 09/25/2023         Hospital Day: 2   Attending physician's note  I have taken a history, reviewed the chart and examined the patient. I performed a substantive portion of this encounter, including complete performance of at least one of the key components, in conjunction with the APP. I agree with the APP's note, impression and recommendations.    63 year old male with iron deficiency anemia, painless lower GI bleed admitted with severe symptomatic anemia History of multiple adenomatous colon polyps History of HCV  Will plan to proceed with EGD and colonoscopy for further evaluation Clear liquid diet Bowel prep Monitor hemoglobin and transfuse to >7   The patient was provided an opportunity to ask questions and all were answered. The patient agreed with the plan and demonstrated an understanding of the instructions.  Iona Beard , MD [phone]    ASSESSMENT    Hemodynamically stable, painless lower GI bleed with severe and recurrent IDA. Rule out diverticular hemorrhage. Could be hemorrhoidal but they don't usually bleed to this degree.  Hemoglobin 5.4 . Baseline unknown (no recent labs).  Interesting that the bleeding started only 2 weeks ago but he is iron deficient again  History of colon polyps Four tubular adenomas removed January 2023.  A 3-year surveillance colonoscopy was recommended  History of: HCV Unsure of treated.  Can address outpatient  Cholelithiasis Asymptomatic   Other medical history includes hypertension, DM2, OSA not on CPAP   PLAN:   --Presently getting third unit of RBCs.  Hemoglobin has improved from 5.4 to 7.1. --Monitor H/H --Given the iron deficiency patient will need EGD and colonoscopy. The risks and benefits of EGD  with possible biopsies and colonoscopy with possible biopsies /polypectomy were discussed with the patient who agrees to proceed.  Will aim for getting these procedures done tomorrow -- Clear liquids today, n.p.o. after midnight . HPI   Cristal has a remote history of iron deficiency anemia.   was evaluated by [place] in 2009 for iron deficiency anemia.  He had an EGD in 2009 for iron deficiency anemia but I am unable to see the results .  He also has a history of colon polyps and is up-to-date on surveillance colonoscopy   Kazumi presented to the ED yesterday with complaints of rectal bleeding with bowel movement over the last couple of weeks.  Also complains of fatigue.  He has not had any abdominal pain.  No rectal pain.  Recently taking ibuprofen a couple times a week but no other NSAIDs.  He describes bowel movements as being normal, no straining.  He does give a history of hemorrhoids and when the bleeding started he attributed it to the hemorrhoids he had a bowel movement with blood this morning but describes the volume of blood as being much less.  Though there are reports of  "maroon blood' patient is adamant that the blood has been bright red   In the ED his hemoglobin was 5.4.  Baseline hemoglobin unknown but it was 13 in November 2021.  WBC 7.9.  INR 1.0.  Normal LFTs.  Normal renal function.   Previous GI Evaluations   January 2023 polyp surveillance colonoscopy -- Quality of the bowel prep was good.  Exam was complete. -- External hemorrhoids.  Three sessile polyps in the transverse colon ranging from 5 to 7 mm in size.  Polyps removed.  An 8 mm polyp in the descending colon was removed.  Moderate diverticulosis of the left colon  Surgical [P], colon, ascending, polyp (3) - TUBULAR ADENOMA(S) WITHOUT HIGH-GRADE DYSPLASIA OR MALIGNANCY - SESSILE SERRATED POLYP WITHOUT CYTOLOGIC DYSPLASIA 2. Surgical [P], colon, descending, polyp (1) - TUBULAR ADENOMA WITHOUT HIGH-GRADE DYSPLASIA OR  MALIGNANCY   Recent Labs    09/25/23 1235 09/26/23 0510 09/26/23 0925  WBC 9.7 8.2 7.9  HGB 5.4* 7.0* 7.1*  HCT 17.6* 22.0* 22.2*  PLT 335 315 318   Recent Labs    09/25/23 1235 09/26/23 0510  NA 135 135  K 4.5 4.2  CL 100 102  CO2 23 23  GLUCOSE 81 78  BUN 21 17  CREATININE 1.49* 1.19  CALCIUM 9.4 8.7*   Recent Labs    09/25/23 1542 09/26/23 0510  PROT 6.7 5.9*  ALBUMIN 3.7 3.2*  AST 22 21  ALT 14 11  ALKPHOS 41 37*  BILITOT 0.6 0.8  BILIDIR <0.1  --   IBILI NOT CALCULATED  --    Recent Labs    09/25/23 1542  LABPROT 13.6  INR 1.0      Past Medical History:  Diagnosis Date   Blood transfusion without reported diagnosis    AS A BABY   Diabetes mellitus without complication (HCC)    Fatty liver    Hepatitis C    Hyperlipidemia    Hypertension    Tubular adenoma of colon 12/2015    Past Surgical History:  Procedure Laterality Date   COLONOSCOPY     POLYPECTOMY      Family History  Problem Relation Age of Onset   Heart disease Mother 8       CAD/AMI   Diabetes Mother    Hypertension Mother    Diabetes Father    Diabetes Sister    Hypertension Sister    Hypertension Brother    Diabetes Brother    Diabetes Maternal Grandfather    Diabetes Paternal Grandfather    Colon cancer Neg Hx    Esophageal cancer Neg Hx    Stomach cancer Neg Hx    Rectal cancer Neg Hx    Colon polyps Neg Hx     Prior to Admission medications   Medication Sig Start Date End Date Taking? Authorizing Provider  amLODipine (NORVASC) 10 MG tablet Take 1 tablet (10 mg total) by mouth daily. 08/19/20  Yes Just, Azalee Course, FNP  atorvastatin (LIPITOR) 40 MG tablet Take 1 tablet (40 mg total) by mouth daily. 08/19/20  Yes Just, Azalee Course, FNP  cyclobenzaprine (FLEXERIL) 5 MG tablet Take 1 tablet (5 mg total) by mouth 3 (three) times daily as needed for muscle spasms. 01/14/23  Yes White, Adrienne R, NP  Dulaglutide (TRULICITY) 1.5 MG/0.5ML SOPN Inject 1.5 mg into the skin  once a week. 08/19/20  Yes Just, Azalee Course, FNP  gabapentin (NEURONTIN) 400 MG capsule Take 1 capsule (400 mg total) by mouth 3 (three) times daily. 08/19/20  Yes Just, Azalee Course, FNP  hydrochlorothiazide (HYDRODIURIL) 25 MG tablet Take 1 tablet (25 mg total) by mouth daily. 08/19/20  Yes Just, Azalee Course, FNP  metFORMIN (GLUCOPHAGE) 1000 MG tablet Take 1  tablet (1,000 mg total) by mouth 2 (two) times daily with a meal. 08/19/20  Yes Just, Azalee Course, FNP  valsartan (DIOVAN) 80 MG tablet Take 1 tablet (80 mg total) by mouth daily. 08/19/20  Yes Just, Azalee Course, FNP  Blood Glucose Monitoring Suppl (ONE TOUCH ULTRA 2) w/Device KIT USE AS DIRECTED TO TEST BLOOD SUGAR IN THE MORNING 08/07/16   Trena Platt D, PA  Blood Glucose Monitoring Suppl KIT 1 application by Does not apply route every morning. 04/30/20   Noni Saupe, MD  Blood Pressure Monitoring (BLOOD PRESSURE CUFF) MISC Use as directed 07/07/17   Sherren Mocha, MD  glucose blood [facility] VERIO) test strip USE ONE STRIP TO CHECK GLUCOSE TWICE DAILY 04/23/19   Lezlie Lye, Meda Coffee, MD  naproxen sodium (ANAPROX DS) 550 MG tablet Take 1 tablet (550 mg total) by mouth 2 (two) times daily with a meal. Patient not taking: Reported on 09/25/2023 01/14/23   Valinda Hoar, NP    Current Facility-Administered Medications  Medication Dose Route Frequency Provider Last Rate Last Admin   0.9 %  sodium chloride infusion (Manually program via Guardrails IV Fluids)   Intravenous Once Therisa Doyne, MD   Held at 09/25/23 2103   0.9 %  sodium chloride infusion (Manually program via Guardrails IV Fluids)   Intravenous Once Osvaldo Shipper, MD       hydrOXYzine (ATARAX) tablet 10 mg  10 mg Oral TID PRN Therisa Doyne, MD       ondansetron (ZOFRAN) tablet 4 mg  4 mg Oral Q6H PRN Therisa Doyne, MD       Or   ondansetron (ZOFRAN) injection 4 mg  4 mg Intravenous Q6H PRN Doutova, Anastassia, MD       pantoprazole (PROTONIX) injection 40 mg  40 mg  Intravenous Q12H Doutova, Anastassia, MD   40 mg at 09/26/23 1017   Current Outpatient Medications  Medication Sig Dispense Refill   amLODipine (NORVASC) 10 MG tablet Take 1 tablet (10 mg total) by mouth daily. 90 tablet 3   atorvastatin (LIPITOR) 40 MG tablet Take 1 tablet (40 mg total) by mouth daily. 90 tablet 2   cyclobenzaprine (FLEXERIL) 5 MG tablet Take 1 tablet (5 mg total) by mouth 3 (three) times daily as needed for muscle spasms. 21 tablet 0   Dulaglutide (TRULICITY) 1.5 MG/0.5ML SOPN Inject 1.5 mg into the skin once a week. 0.5 mL 10   gabapentin (NEURONTIN) 400 MG capsule Take 1 capsule (400 mg total) by mouth 3 (three) times daily. 90 capsule 3   hydrochlorothiazide (HYDRODIURIL) 25 MG tablet Take 1 tablet (25 mg total) by mouth daily. 90 tablet 1   metFORMIN (GLUCOPHAGE) 1000 MG tablet Take 1 tablet (1,000 mg total) by mouth 2 (two) times daily with a meal. 90 tablet 6   valsartan (DIOVAN) 80 MG tablet Take 1 tablet (80 mg total) by mouth daily. 90 tablet 3   Blood Glucose Monitoring Suppl (ONE TOUCH ULTRA 2) w/Device KIT USE AS DIRECTED TO TEST BLOOD SUGAR IN THE MORNING 1 each 0   Blood Glucose Monitoring Suppl KIT 1 application by Does not apply route every morning. 1 kit 3   Blood Pressure Monitoring (BLOOD PRESSURE CUFF) MISC Use as directed 1 each 0   glucose blood (ONETOUCH VERIO) test strip USE ONE STRIP TO CHECK GLUCOSE TWICE DAILY 150 each 6   naproxen sodium (ANAPROX DS) 550 MG tablet Take 1 tablet (550 mg total) by mouth 2 (two)  times daily with a meal. (Patient not taking: Reported on 09/25/2023) 30 tablet 0    Allergies as of 09/25/2023 - Review Complete 09/25/2023  Allergen Reaction Noted   Tylenol [acetaminophen] Hypertension 09/13/2011    Social History   Socioeconomic History   Marital status: Married    Spouse name: Not on file   Number of children: Not on file   Years of education: Not on file   Highest education level: Not on file  Occupational  History   Not on file  Tobacco Use   Smoking status: Former   Smokeless tobacco: Never  Vaping Use   Vaping status: Never Used  Substance and Sexual Activity   Alcohol use: Yes    Comment: occasional wine   Drug use: No   Sexual activity: Yes  Other Topics Concern   Not on file  Social History Narrative   Marital status: married x 24 years      Children: 1 child; no grandchildren      Lives: with wife; daughter in [place] [employer] [place]      Employment: Teaching laboratory technician x 14 years; [employer]      Tobacco: quit smoking after ten years      Alcohol: 3 glasses of wine weekly      Exercise: none            Social Drivers of Corporate investment banker Strain: Not on file  Food Insecurity: No Food Insecurity (09/25/2023)   Hunger Vital Sign    Worried About Running Out of Food in the Last Year: Never true    Ran Out of Food in the Last Year: Never true  Transportation Needs: No Transportation Needs (09/25/2023)   PRAPARE - Administrator, Civil Service (Medical): No    Lack of Transportation (Non-Medical): No  Physical Activity: Not on file  Stress: Not on file  Social Connections: Not on file  Intimate Partner Violence: Not At Risk (09/25/2023)   Humiliation, Afraid, Rape, and Kick questionnaire    Fear of Current or Ex-Partner: No    Emotionally Abused: No    Physically Abused: No    Sexually Abused: No     Code Status   Code Status: Full Code  Review of Systems: All systems reviewed and negative except where noted in HPI.  Physical Exam: Vital signs in last 24 hours: Temp:  [97.8 F (36.6 C)-99 F (37.2 C)] 98.6 F (37 C) (03/04 1112) Pulse Rate:  [78-97] 79 (03/04 1112) Resp:  [15-28] 17 (03/04 1112) BP: (101-140)/(58-99) 103/70 (03/04 1112) SpO2:  [99 %-100 %] 100 % (03/04 1107) Weight:  [54.4 kg] 54.4 kg (03/03 1218)    General:  Pleasant male in NAD Psych:  Cooperative. Normal mood and affect Eyes:  Pupils equal Ears:  Normal auditory acuity Nose: No deformity, discharge or lesions Neck:  Supple, no masses felt Lungs:  Clear to auscultation.  Heart:  Regular rate, regular rhythm.  Abdomen:  Soft, nondistended, nontender, active bowel sounds, no masses felt Rectal : Protruding hemorrhoids on exam .  Tight anal canal probably secondary to internal hemorrhoid swelling no blood on DRE Msk: Symmetrical without gross deformities.  Neurologic:  Alert, oriented, grossly normal neurologically Extremities : No edema Skin:  Intact without significant lesions.    Intake/Output from previous day: 03/03 0701 - 03/04 0700 In: 727.3 [I.V.:97.3; Blood:630] Out: -  Intake/Output this shift:  No intake/output data recorded.  Willette Cluster, NP-C   09/26/2023, 11:13 AM

## 2023-09-26 NOTE — Progress Notes (Signed)
 Pt arrived to unit room 5W06 via stretcher from the ED.  ED staff assisted pt from the stretcher to his bed, pt ambulated with a steady gait. Pt placed on room wall bedside monitor and notified central monitoring of placement. Pt oriented to room, call bell, bed controls and instructed not to get up out of bed without calling for assist for his safety, bed alarm placed on and explained to the pt. Pt's primary nurse Leavy Cella, RN made aware of the above.   Tenee Wish,RN SWOT

## 2023-09-26 NOTE — Progress Notes (Signed)
 [facility] PROGRESS NOTE   Alejandro CORRALES ZOX:[id number] DOB: Mar 08, 1961 DOA: 09/25/2023  PCP: Renford Dills, MD  Brief History:  62 y.o. male with medical history significant of hemorrhoids, HTN HLD, DM2 who presented with fatigue and blood in the stools ongoing for at least 2 weeks.  He has noticed blood in the stool as well as maroon stool.  He underwent colonoscopy in 2023 which showed internal hemorrhoids along with diverticulosis.  Was found to have 1 polyp as well.  Patient's hemoglobin was noted to be 5.4.  He was hospitalized for further management.  Consultants: Gastroenterology  Procedures: Blood transfusion    Subjective/Interval History: Patient denies any abdominal pain nausea or vomiting.  Had some blood in the stool this morning as well.  Denies any dizziness or lightheadedness.  No chest pain.    Assessment/Plan:  Hematochezia Came in with hemoglobin of 5.4.  Underwent CT angiogram abdomen which did not show any active bleeding.  Extensive diverticulosis was noted.  No evidence for diverticulitis. Last colonoscopy was in 2023.  Bleeding is likely combination of diverticular as well as hemorrhoidal.  Had significant drop in hemoglobin. GI has been consulted.  Hemodynamically he is stable.  Acute blood loss anemia/iron deficiency Hemoglobin was 5.4 at admission.  Secondary to GI blood loss.  Has been transfused.  Monitor CBC and transfuse if hemoglobin drops below 7.  Last hemoglobin is 7.0. Anemia panel shows ferritin of 4, iron of 15, TIBC 379, percent saturation 4.  B12 457.  Folic acid 27.7.  Will benefit from iron supplementation.  Essential hypertension Holding antihypertensives.  Diabetes mellitus type II with hyperglycemia Holding diabetic medications as well.  1 episode of hypoglycemia noted last night.  Monitor CBGs. HbA1c 5.7.  Acute kidney injury Resolved with IV fluids.  Abnormal UA noted but patient denies any signs or symptoms of  UTI.  Chronic hepatitis C Outpatient management.  Cholelithiasis Incidentally noted on CT scan.  LFTs noted to be normal.  DVT Prophylaxis: SCDs Code Status: Full code Family Communication: Discussed with the patient Disposition Plan: Hopefully return home when improved  Status is: Inpatient Remains inpatient appropriate because: GI bleed      Medications: Scheduled:  sodium chloride   Intravenous Once   pantoprazole (PROTONIX) IV  40 mg Intravenous Q12H   Continuous: [id number], ondansetron **OR** ondansetron (ZOFRAN) IV  Antibiotics: Anti-infectives (From admission, onward)    None       Objective:  Vital Signs  Vitals:   09/26/23 0715 09/26/23 0721 09/26/23 0725 09/26/23 0815  BP: 112/65   116/84  Pulse: 88   89  Resp: (!) 25   16  Temp:   98.3 F (36.8 C)   TempSrc:   Oral   SpO2: 100% 99%  100%  Weight:      Height:        Intake/Output Summary (Last 24 hours) at 09/26/2023 0927 Last data filed at 09/26/2023 0348 Gross per 24 hour  Intake 727.33 ml  Output --  Net 727.33 ml   Filed Weights   09/25/23 1218  Weight: 54.4 kg    General appearance: Awake alert.  In no distress Resp: Clear to auscultation bilaterally.  Normal effort Cardio: S1-S2 is normal regular.  No S3-S4.  No rubs murmurs or bruit GI: Abdomen is soft.  Nontender nondistended.  Bowel sounds are present normal.  No masses organomegaly Extremities: No edema.  Full range of motion of lower extremities. Neurologic: Alert and oriented x3.  No focal neurological deficits.    Lab Results:  Data Reviewed: I have personally reviewed following labs and reports of the imaging studies  CBC: Recent Labs  Lab 09/25/23 1235 09/26/23 0510  WBC 9.7 8.2  NEUTROABS 7.1  --   HGB 5.4* 7.0*  HCT 17.6* 22.0*  MCV 91.2 88.0  PLT 335 315    Basic Metabolic Panel: Recent Labs  Lab 09/25/23 1235 09/25/23 1542 09/26/23 0510  NA 135  --  135  K 4.5  --  4.2  CL 100  --  102   CO2 23  --  23  GLUCOSE 81  --  78  BUN 21  --  17  CREATININE 1.49*  --  1.19  CALCIUM 9.4  --  8.7*  MG  --  1.9 2.0  PHOS  --  4.5 4.5    GFR: Estimated Creatinine Clearance: 48.9 mL/min (by C-G formula based on SCr of 1.19 mg/dL).  Liver Function Tests: Recent Labs  Lab 09/25/23 1542 09/26/23 0510  AST 22 21  ALT 14 11  ALKPHOS 41 37*  BILITOT 0.6 0.8  PROT 6.7 5.9*  ALBUMIN 3.7 3.2*     Coagulation Profile: Recent Labs  Lab 09/25/23 1542  INR 1.0    Cardiac Enzymes: Recent Labs  Lab 09/25/23 1542  CKTOTAL 215    HbA1C: Recent Labs    09/25/23 1542  HGBA1C 5.7*    CBG: Recent Labs  Lab 09/25/23 2133 09/26/23 0154 09/26/23 0509 09/26/23 0614 09/26/23 0754  GLUCAP 198* 154* 76 141* 137*    Thyroid Function Tests: Recent Labs    09/25/23 1542  TSH 3.099    Anemia Panel: Recent Labs    09/25/23 1542  VITAMINB12 457  FOLATE 27.7  FERRITIN 4*  TIBC 379  IRON 15*  RETICCTPCT 6.2*    Radiology Studies: CT Angio Abd/Pel W and/or Wo Contrast Result Date: 09/25/2023 CLINICAL DATA:  Lower GI bleed. Low blood pressures. Lethargy. Blood in stool. EXAM: CTA ABDOMEN AND PELVIS WITHOUT AND WITH CONTRAST TECHNIQUE: Multidetector CT imaging of the abdomen and pelvis was performed using the standard protocol during bolus administration of intravenous contrast. Multiplanar reconstructed images and MIPs were obtained and reviewed to evaluate the vascular anatomy. RADIATION DOSE REDUCTION: This exam was performed according to the departmental dose-optimization program which includes automated exposure control, adjustment of the mA and/or kV according to patient size and/or use of iterative reconstruction technique. CONTRAST:  75mL OMNIPAQUE IOHEXOL 350 MG/ML SOLN COMPARISON:  Abdominal ultrasound 01/11/2017 FINDINGS: VASCULAR Scattered calcified atherosclerotic plaque the aorta and its mesenteric, renal, and iliac artery branches without hemodynamically  significant stenosis. No aortic aneurysm or dissection. Review of the MIP images confirms the above findings. NON-VASCULAR Lower chest: No acute abnormality. Hepatobiliary: Cholelithiasis. No evidence of acute cholecystitis. Unremarkable liver. Pancreas: Unremarkable. Spleen: Unremarkable. Adrenals/Urinary Tract: Normal adrenal glands. No urinary calculi or hydronephrosis. Unremarkable bladder. Stomach/Bowel: Small hiatal hernia. Stomach is otherwise within normal limits. No bowel obstruction. Extensive sigmoid diverticulosis. No evidence of diverticulitis. No evidence of active GI bleeding. Normal appendix. Lymphatic: No lymphadenopathy. Reproductive: No acute abnormality. Other: No free intraperitoneal fluid or air. Musculoskeletal: No acute fracture. IMPRESSION: 1. No evidence of active GI bleeding. 2. Extensive sigmoid diverticulosis. No evidence of diverticulitis. 3. Cholelithiasis. No evidence of acute cholecystitis. 4. Aortic Atherosclerosis (ICD10-I70.0). Electronically Signed   By: Minerva Fester M.D.   On: 09/25/2023 21:22   DG Chest 2 View Result Date: 09/25/2023 CLINICAL DATA:  Fatigue.  EXAM: CHEST - 2 VIEW COMPARISON:  Chest radiograph dated 08/02/2018. FINDINGS: The heart size and mediastinal contours are within normal limits. Stable calcified nodule in the left upper lobe, adjacent to the left hilum. No focal consolidation, pleural effusion, or pneumothorax. Degenerative changes of the thoracic spine. No acute osseous abnormality. IMPRESSION: No acute cardiopulmonary findings. Electronically Signed   By: Hart Robinsons M.D.   On: 09/25/2023 16:04       LOS: 1 day   Osvaldo Shipper  [facility] Pager on [email or web address]  09/26/2023, 9:27 AM

## 2023-09-26 NOTE — H&P (View-Only) (Signed)
 Consultation Note   Referring Provider:  [facility] PCP: Renford Dills, MD Primary Gastroenterologist:   Claudette Head, MD   Reason for Consultation: GI bleed DOA: 09/25/2023         Hospital Day: 2   Attending physician's note  I have taken a history, reviewed the chart and examined the patient. I performed a substantive portion of this encounter, including complete performance of at least one of the key components, in conjunction with the APP. I agree with the APP's note, impression and recommendations.    63 year old male with iron deficiency anemia, painless lower GI bleed admitted with severe symptomatic anemia History of multiple adenomatous colon polyps History of HCV  Will plan to proceed with EGD and colonoscopy for further evaluation Clear liquid diet Bowel prep Monitor hemoglobin and transfuse to >7   The patient was provided an opportunity to ask questions and all were answered. The patient agreed with the plan and demonstrated an understanding of the instructions.  Iona Beard , MD [phone]    ASSESSMENT    Hemodynamically stable, painless lower GI bleed with severe and recurrent IDA. Rule out diverticular hemorrhage. Could be hemorrhoidal but they don't usually bleed to this degree.  Hemoglobin 5.4 . Baseline unknown (no recent labs).  Interesting that the bleeding started only 2 weeks ago but he is iron deficient again  History of colon polyps Four tubular adenomas removed January 2023.  A 3-year surveillance colonoscopy was recommended  History of: HCV Unsure of treated.  Can address outpatient  Cholelithiasis Asymptomatic   Other medical history includes hypertension, DM2, OSA not on CPAP   PLAN:   --Presently getting third unit of RBCs.  Hemoglobin has improved from 5.4 to 7.1. --Monitor H/H --Given the iron deficiency patient will need EGD and colonoscopy. The risks and benefits of EGD  with possible biopsies and colonoscopy with possible biopsies /polypectomy were discussed with the patient who agrees to proceed.  Will aim for getting these procedures done tomorrow -- Clear liquids today, n.p.o. after midnight . HPI   Cristal has a remote history of iron deficiency anemia.   was evaluated by [place] in 2009 for iron deficiency anemia.  He had an EGD in 2009 for iron deficiency anemia but I am unable to see the results .  He also has a history of colon polyps and is up-to-date on surveillance colonoscopy   Kazumi presented to the ED yesterday with complaints of rectal bleeding with bowel movement over the last couple of weeks.  Also complains of fatigue.  He has not had any abdominal pain.  No rectal pain.  Recently taking ibuprofen a couple times a week but no other NSAIDs.  He describes bowel movements as being normal, no straining.  He does give a history of hemorrhoids and when the bleeding started he attributed it to the hemorrhoids he had a bowel movement with blood this morning but describes the volume of blood as being much less.  Though there are reports of  "maroon blood' patient is adamant that the blood has been bright red   In the ED his hemoglobin was 5.4.  Baseline hemoglobin unknown but it was 13 in November 2021.  WBC 7.9.  INR 1.0.  Normal LFTs.  Normal renal function.   Previous GI Evaluations   January 2023 polyp surveillance colonoscopy -- Quality of the bowel prep was good.  Exam was complete. -- External hemorrhoids.  Three sessile polyps in the transverse colon ranging from 5 to 7 mm in size.  Polyps removed.  An 8 mm polyp in the descending colon was removed.  Moderate diverticulosis of the left colon  Surgical [P], colon, ascending, polyp (3) - TUBULAR ADENOMA(S) WITHOUT HIGH-GRADE DYSPLASIA OR MALIGNANCY - SESSILE SERRATED POLYP WITHOUT CYTOLOGIC DYSPLASIA 2. Surgical [P], colon, descending, polyp (1) - TUBULAR ADENOMA WITHOUT HIGH-GRADE DYSPLASIA OR  MALIGNANCY   Recent Labs    09/25/23 1235 09/26/23 0510 09/26/23 0925  WBC 9.7 8.2 7.9  HGB 5.4* 7.0* 7.1*  HCT 17.6* 22.0* 22.2*  PLT 335 315 318   Recent Labs    09/25/23 1235 09/26/23 0510  NA 135 135  K 4.5 4.2  CL 100 102  CO2 23 23  GLUCOSE 81 78  BUN 21 17  CREATININE 1.49* 1.19  CALCIUM 9.4 8.7*   Recent Labs    09/25/23 1542 09/26/23 0510  PROT 6.7 5.9*  ALBUMIN 3.7 3.2*  AST 22 21  ALT 14 11  ALKPHOS 41 37*  BILITOT 0.6 0.8  BILIDIR <0.1  --   IBILI NOT CALCULATED  --    Recent Labs    09/25/23 1542  LABPROT 13.6  INR 1.0      Past Medical History:  Diagnosis Date   Blood transfusion without reported diagnosis    AS A BABY   Diabetes mellitus without complication (HCC)    Fatty liver    Hepatitis C    Hyperlipidemia    Hypertension    Tubular adenoma of colon 12/2015    Past Surgical History:  Procedure Laterality Date   COLONOSCOPY     POLYPECTOMY      Family History  Problem Relation Age of Onset   Heart disease Mother 8       CAD/AMI   Diabetes Mother    Hypertension Mother    Diabetes Father    Diabetes Sister    Hypertension Sister    Hypertension Brother    Diabetes Brother    Diabetes Maternal Grandfather    Diabetes Paternal Grandfather    Colon cancer Neg Hx    Esophageal cancer Neg Hx    Stomach cancer Neg Hx    Rectal cancer Neg Hx    Colon polyps Neg Hx     Prior to Admission medications   Medication Sig Start Date End Date Taking? Authorizing Provider  amLODipine (NORVASC) 10 MG tablet Take 1 tablet (10 mg total) by mouth daily. 08/19/20  Yes Just, Azalee Course, FNP  atorvastatin (LIPITOR) 40 MG tablet Take 1 tablet (40 mg total) by mouth daily. 08/19/20  Yes Just, Azalee Course, FNP  cyclobenzaprine (FLEXERIL) 5 MG tablet Take 1 tablet (5 mg total) by mouth 3 (three) times daily as needed for muscle spasms. 01/14/23  Yes White, Adrienne R, NP  Dulaglutide (TRULICITY) 1.5 MG/0.5ML SOPN Inject 1.5 mg into the skin  once a week. 08/19/20  Yes Just, Azalee Course, FNP  gabapentin (NEURONTIN) 400 MG capsule Take 1 capsule (400 mg total) by mouth 3 (three) times daily. 08/19/20  Yes Just, Azalee Course, FNP  hydrochlorothiazide (HYDRODIURIL) 25 MG tablet Take 1 tablet (25 mg total) by mouth daily. 08/19/20  Yes Just, Azalee Course, FNP  metFORMIN (GLUCOPHAGE) 1000 MG tablet Take 1  tablet (1,000 mg total) by mouth 2 (two) times daily with a meal. 08/19/20  Yes Just, Azalee Course, FNP  valsartan (DIOVAN) 80 MG tablet Take 1 tablet (80 mg total) by mouth daily. 08/19/20  Yes Just, Azalee Course, FNP  Blood Glucose Monitoring Suppl (ONE TOUCH ULTRA 2) w/Device KIT USE AS DIRECTED TO TEST BLOOD SUGAR IN THE MORNING 08/07/16   Trena Platt D, PA  Blood Glucose Monitoring Suppl KIT 1 application by Does not apply route every morning. 04/30/20   Noni Saupe, MD  Blood Pressure Monitoring (BLOOD PRESSURE CUFF) MISC Use as directed 07/07/17   Sherren Mocha, MD  glucose blood [facility] VERIO) test strip USE ONE STRIP TO CHECK GLUCOSE TWICE DAILY 04/23/19   Lezlie Lye, Meda Coffee, MD  naproxen sodium (ANAPROX DS) 550 MG tablet Take 1 tablet (550 mg total) by mouth 2 (two) times daily with a meal. Patient not taking: Reported on 09/25/2023 01/14/23   Valinda Hoar, NP    Current Facility-Administered Medications  Medication Dose Route Frequency Provider Last Rate Last Admin   0.9 %  sodium chloride infusion (Manually program via Guardrails IV Fluids)   Intravenous Once Therisa Doyne, MD   Held at 09/25/23 2103   0.9 %  sodium chloride infusion (Manually program via Guardrails IV Fluids)   Intravenous Once Osvaldo Shipper, MD       hydrOXYzine (ATARAX) tablet 10 mg  10 mg Oral TID PRN Therisa Doyne, MD       ondansetron (ZOFRAN) tablet 4 mg  4 mg Oral Q6H PRN Therisa Doyne, MD       Or   ondansetron (ZOFRAN) injection 4 mg  4 mg Intravenous Q6H PRN Doutova, Anastassia, MD       pantoprazole (PROTONIX) injection 40 mg  40 mg  Intravenous Q12H Doutova, Anastassia, MD   40 mg at 09/26/23 1017   Current Outpatient Medications  Medication Sig Dispense Refill   amLODipine (NORVASC) 10 MG tablet Take 1 tablet (10 mg total) by mouth daily. 90 tablet 3   atorvastatin (LIPITOR) 40 MG tablet Take 1 tablet (40 mg total) by mouth daily. 90 tablet 2   cyclobenzaprine (FLEXERIL) 5 MG tablet Take 1 tablet (5 mg total) by mouth 3 (three) times daily as needed for muscle spasms. 21 tablet 0   Dulaglutide (TRULICITY) 1.5 MG/0.5ML SOPN Inject 1.5 mg into the skin once a week. 0.5 mL 10   gabapentin (NEURONTIN) 400 MG capsule Take 1 capsule (400 mg total) by mouth 3 (three) times daily. 90 capsule 3   hydrochlorothiazide (HYDRODIURIL) 25 MG tablet Take 1 tablet (25 mg total) by mouth daily. 90 tablet 1   metFORMIN (GLUCOPHAGE) 1000 MG tablet Take 1 tablet (1,000 mg total) by mouth 2 (two) times daily with a meal. 90 tablet 6   valsartan (DIOVAN) 80 MG tablet Take 1 tablet (80 mg total) by mouth daily. 90 tablet 3   Blood Glucose Monitoring Suppl (ONE TOUCH ULTRA 2) w/Device KIT USE AS DIRECTED TO TEST BLOOD SUGAR IN THE MORNING 1 each 0   Blood Glucose Monitoring Suppl KIT 1 application by Does not apply route every morning. 1 kit 3   Blood Pressure Monitoring (BLOOD PRESSURE CUFF) MISC Use as directed 1 each 0   glucose blood (ONETOUCH VERIO) test strip USE ONE STRIP TO CHECK GLUCOSE TWICE DAILY 150 each 6   naproxen sodium (ANAPROX DS) 550 MG tablet Take 1 tablet (550 mg total) by mouth 2 (two)  times daily with a meal. (Patient not taking: Reported on 09/25/2023) 30 tablet 0    Allergies as of 09/25/2023 - Review Complete 09/25/2023  Allergen Reaction Noted   Tylenol [acetaminophen] Hypertension 09/13/2011    Social History   Socioeconomic History   Marital status: Married    Spouse name: Not on file   Number of children: Not on file   Years of education: Not on file   Highest education level: Not on file  Occupational  History   Not on file  Tobacco Use   Smoking status: Former   Smokeless tobacco: Never  Vaping Use   Vaping status: Never Used  Substance and Sexual Activity   Alcohol use: Yes    Comment: occasional wine   Drug use: No   Sexual activity: Yes  Other Topics Concern   Not on file  Social History Narrative   Marital status: married x 24 years      Children: 1 child; no grandchildren      Lives: with wife; daughter in [place] [employer] [place]      Employment: Teaching laboratory technician x 14 years; [employer]      Tobacco: quit smoking after ten years      Alcohol: 3 glasses of wine weekly      Exercise: none            Social Drivers of Corporate investment banker Strain: Not on file  Food Insecurity: No Food Insecurity (09/25/2023)   Hunger Vital Sign    Worried About Running Out of Food in the Last Year: Never true    Ran Out of Food in the Last Year: Never true  Transportation Needs: No Transportation Needs (09/25/2023)   PRAPARE - Administrator, Civil Service (Medical): No    Lack of Transportation (Non-Medical): No  Physical Activity: Not on file  Stress: Not on file  Social Connections: Not on file  Intimate Partner Violence: Not At Risk (09/25/2023)   Humiliation, Afraid, Rape, and Kick questionnaire    Fear of Current or Ex-Partner: No    Emotionally Abused: No    Physically Abused: No    Sexually Abused: No     Code Status   Code Status: Full Code  Review of Systems: All systems reviewed and negative except where noted in HPI.  Physical Exam: Vital signs in last 24 hours: Temp:  [97.8 F (36.6 C)-99 F (37.2 C)] 98.6 F (37 C) (03/04 1112) Pulse Rate:  [78-97] 79 (03/04 1112) Resp:  [15-28] 17 (03/04 1112) BP: (101-140)/(58-99) 103/70 (03/04 1112) SpO2:  [99 %-100 %] 100 % (03/04 1107) Weight:  [54.4 kg] 54.4 kg (03/03 1218)    General:  Pleasant male in NAD Psych:  Cooperative. Normal mood and affect Eyes:  Pupils equal Ears:  Normal auditory acuity Nose: No deformity, discharge or lesions Neck:  Supple, no masses felt Lungs:  Clear to auscultation.  Heart:  Regular rate, regular rhythm.  Abdomen:  Soft, nondistended, nontender, active bowel sounds, no masses felt Rectal : Protruding hemorrhoids on exam .  Tight anal canal probably secondary to internal hemorrhoid swelling no blood on DRE Msk: Symmetrical without gross deformities.  Neurologic:  Alert, oriented, grossly normal neurologically Extremities : No edema Skin:  Intact without significant lesions.    Intake/Output from previous day: 03/03 0701 - 03/04 0700 In: 727.3 [I.V.:97.3; Blood:630] Out: -  Intake/Output this shift:  No intake/output data recorded.  Willette Cluster, NP-C   09/26/2023, 11:13 AM

## 2023-09-27 ENCOUNTER — Inpatient Hospital Stay (HOSPITAL_COMMUNITY): Admitting: Anesthesiology

## 2023-09-27 ENCOUNTER — Encounter (HOSPITAL_COMMUNITY): Payer: Self-pay | Admitting: Internal Medicine

## 2023-09-27 ENCOUNTER — Encounter (HOSPITAL_COMMUNITY)
Admission: EM | Disposition: A | Discharge status: Home / Self Care | Payer: Self-pay | Source: Home / Self Care | Attending: Internal Medicine

## 2023-09-27 DIAGNOSIS — K922 Gastrointestinal hemorrhage, unspecified: Secondary | ICD-10-CM

## 2023-09-27 DIAGNOSIS — K449 Diaphragmatic hernia without obstruction or gangrene: Secondary | ICD-10-CM

## 2023-09-27 DIAGNOSIS — D509 Iron deficiency anemia, unspecified: Secondary | ICD-10-CM

## 2023-09-27 DIAGNOSIS — K921 Melena: Secondary | ICD-10-CM | POA: Diagnosis not present

## 2023-09-27 DIAGNOSIS — K648 Other hemorrhoids: Secondary | ICD-10-CM

## 2023-09-27 DIAGNOSIS — N179 Acute kidney failure, unspecified: Secondary | ICD-10-CM | POA: Diagnosis not present

## 2023-09-27 DIAGNOSIS — D649 Anemia, unspecified: Secondary | ICD-10-CM | POA: Diagnosis not present

## 2023-09-27 HISTORY — PX: ESOPHAGOGASTRODUODENOSCOPY: SHX5428

## 2023-09-27 HISTORY — PX: HEMOSTASIS CLIP PLACEMENT: SHX6857

## 2023-09-27 HISTORY — PX: COLONOSCOPY: SHX5424

## 2023-09-27 LAB — CBC
HCT: 28.5 % — ABNORMAL LOW (ref 39.0–52.0)
HCT: 26 % — ABNORMAL LOW (ref 39.0–52.0)
Hemoglobin: 9.3 g/dL — ABNORMAL LOW (ref 13.0–17.0)
Hemoglobin: 8.4 g/dL — ABNORMAL LOW (ref 13.0–17.0)
MCH: 28.4 pg (ref 26.0–34.0)
MCH: 28.2 pg (ref 26.0–34.0)
MCHC: 32.6 g/dL (ref 30.0–36.0)
MCHC: 32.3 g/dL (ref 30.0–36.0)
MCV: 86.9 fL (ref 80.0–100.0)
MCV: 87.2 fL (ref 80.0–100.0)
Platelets: 375 10*3/uL (ref 150–400)
Platelets: 324 10*3/uL (ref 150–400)
RBC: 3.28 MIL/uL — ABNORMAL LOW (ref 4.22–5.81)
RBC: 2.98 MIL/uL — ABNORMAL LOW (ref 4.22–5.81)
RDW: 14.6 % (ref 11.5–15.5)
RDW: 14.7 % (ref 11.5–15.5)
WBC: 7.3 10*3/uL (ref 4.0–10.5)
WBC: 6 10*3/uL (ref 4.0–10.5)
nRBC: 0 % (ref 0.0–0.2)
nRBC: 0 % (ref 0.0–0.2)

## 2023-09-27 LAB — TYPE AND SCREEN
ABO/RH(D): O NEG
Antibody Screen: NEGATIVE
Unit division: 0
Unit division: 0
Unit division: 0

## 2023-09-27 LAB — BPAM RBC
Blood Product Expiration Date: 202503152359
Blood Product Expiration Date: 202503112359
Blood Product Expiration Date: 202503112359
ISSUE DATE / TIME: 202503041100
ISSUE DATE / TIME: 202503031738
ISSUE DATE / TIME: 202503032043
Unit Type and Rh: 9500
Unit Type and Rh: 9500
Unit Type and Rh: 9500

## 2023-09-27 LAB — GLUCOSE, CAPILLARY
Glucose-Capillary: 105 mg/dL — ABNORMAL HIGH (ref 70–99)
Glucose-Capillary: 79 mg/dL (ref 70–99)
Glucose-Capillary: 80 mg/dL (ref 70–99)
Glucose-Capillary: 84 mg/dL (ref 70–99)
Glucose-Capillary: 84 mg/dL (ref 70–99)
Glucose-Capillary: 88 mg/dL (ref 70–99)
Glucose-Capillary: 89 mg/dL (ref 70–99)

## 2023-09-27 LAB — BASIC METABOLIC PANEL
Anion gap: 6 (ref 5–15)
BUN: 10 mg/dL (ref 8–23)
CO2: 24 mmol/L (ref 22–32)
Calcium: 9 mg/dL (ref 8.9–10.3)
Chloride: 108 mmol/L (ref 98–111)
Creatinine, Ser: 1.19 mg/dL (ref 0.61–1.24)
GFR, Estimated: >60 mL/min (ref >60)
Glucose, Bld: 117 mg/dL — ABNORMAL HIGH (ref 70–99)
Potassium: 4.3 mmol/L (ref 3.5–5.1)
Sodium: 138 mmol/L (ref 135–145)

## 2023-09-27 LAB — HCV RNA QUANT: HCV Quantitative: NOT DETECTED [IU]/mL (ref >50)

## 2023-09-27 SURGERY — EGD (ESOPHAGOGASTRODUODENOSCOPY)
Anesthesia: Monitor Anesthesia Care

## 2023-09-27 MED ORDER — PROPOFOL 10 MG/ML IV BOLUS
INTRAVENOUS | Status: DC | PRN
  Administered 2023-09-27: 125 ug/kg/min via INTRAVENOUS
  Administered 2023-09-27: 20 mg via INTRAVENOUS
  Administered 2023-09-27 (×2): 40 mg via INTRAVENOUS
  Administered 2023-09-27 (×2): 30 mg via INTRAVENOUS

## 2023-09-27 MED ORDER — DEXMEDETOMIDINE HCL IN NACL 80 MCG/20ML IV SOLN
INTRAVENOUS | Status: DC | PRN
Start: 2023-09-27 — End: 2023-09-27
  Administered 2023-09-27: 8 ug via INTRAVENOUS

## 2023-09-27 MED ORDER — GLYCOPYRROLATE PF 0.2 MG/ML IJ SOSY
PREFILLED_SYRINGE | INTRAMUSCULAR | Status: AC
  Filled 2023-09-27: qty 1

## 2023-09-27 MED ORDER — PHENYLEPHRINE HCL (PRESSORS) 10 MG/ML IV SOLN
INTRAVENOUS | Status: DC | PRN
  Administered 2023-09-27: 160 ug via INTRAVENOUS
  Administered 2023-09-27: 80 ug via INTRAVENOUS
  Administered 2023-09-27: 160 ug via INTRAVENOUS

## 2023-09-27 MED ORDER — SODIUM CHLORIDE 0.9 % IV SOLN
250.0000 mg | INTRAVENOUS | Status: DC
  Administered 2023-09-27: 250 mg via INTRAVENOUS
  Filled 2023-09-27 (×2): qty 12.5

## 2023-09-27 MED ORDER — LIDOCAINE 2% (20 MG/ML) 5 ML SYRINGE
INTRAMUSCULAR | Status: AC
  Filled 2023-09-27: qty 5

## 2023-09-27 MED ORDER — LACTATED RINGERS IV SOLN: INTRAVENOUS | Status: AC

## 2023-09-27 MED ORDER — ALBUMIN HUMAN 5 % IV SOLN: INTRAVENOUS | Status: DC | PRN

## 2023-09-27 MED ORDER — LIDOCAINE 2% (20 MG/ML) 5 ML SYRINGE
INTRAMUSCULAR | Status: DC | PRN
  Administered 2023-09-27: 50 mg via INTRAVENOUS

## 2023-09-27 MED ORDER — SODIUM CHLORIDE 0.9 % IV SOLN: INTRAVENOUS | Status: DC | PRN

## 2023-09-27 NOTE — Anesthesia Preprocedure Evaluation (Signed)
 Anesthesia Evaluation  Patient identified by MRN, date of birth, ID band Patient awake    Reviewed: Allergy & Precautions, NPO status , Patient's Chart, lab work & pertinent test results, reviewed documented beta blocker date and time   History of Anesthesia Complications Negative for: history of anesthetic complications  Airway Mallampati: III  TM Distance: >3 FB     Dental  (+) Missing, Poor Dentition, Loose   Pulmonary neg COPD, former smoker, neg PE   breath sounds clear to auscultation       Cardiovascular hypertension, + Peripheral Vascular Disease  (-) CAD, (-) Past MI, (-) Cardiac Stents, (-) CABG, (-) CHF and (-) DOE (-) dysrhythmias (-) pacemaker Rhythm:Regular Rate:Normal     Neuro/Psych neg Seizures    GI/Hepatic Bowel prep,,,(+) Hepatitis -, C  Endo/Other  diabetes    Renal/GU Renal disease     Musculoskeletal   Abdominal   Peds  Hematology  (+) Blood dyscrasia, anemia   Anesthesia Other Findings   Reproductive/Obstetrics                              Anesthesia Physical Anesthesia Plan  ASA: 2  Anesthesia Plan: MAC   Post-op Pain Management:    Induction: Intravenous  PONV Risk Score and Plan: 1 and Propofol infusion  Airway Management Planned: Nasal Cannula and Natural Airway  Additional Equipment:   Intra-op Plan:   Post-operative Plan:   Informed Consent: I have reviewed the patients History and Physical, chart, labs and discussed the procedure including the risks, benefits and alternatives for the proposed anesthesia with the patient or authorized representative who has indicated his/her understanding and acceptance.     Dental advisory given  Plan Discussed with: CRNA  Anesthesia Plan Comments:          Anesthesia Quick Evaluation

## 2023-09-27 NOTE — Interval H&P Note (Signed)
 History and Physical Interval Note:  09/27/2023 12:51 PM  Alejandro [name]  has presented today for surgery, with the diagnosis of Lower GI bleeding, iron deficiency anemia.  The various methods of treatment have been discussed with the patient and family. After consideration of risks, benefits and other options for treatment, the patient has consented to  Procedure(s): EGD (ESOPHAGOGASTRODUODENOSCOPY) (N/A) COLONOSCOPY (N/A) as a surgical intervention.  The patient's history has been reviewed, patient examined, no change in status, stable for surgery.  I have reviewed the patient's chart and labs.  Questions were answered to the patient's satisfaction.     Britaney Espaillat

## 2023-09-27 NOTE — Progress Notes (Signed)
 [facility] PROGRESS NOTE   Alejandro [name]:[id number] DOB: 07-08-1961 DOA: 09/25/2023  PCP: Renford Dills, MD  Brief History:  63 y.o. male with medical history significant of hemorrhoids, HTN HLD, DM2 who presented with fatigue and blood in the stools ongoing for at least 2 weeks.  He has noticed blood in the stool as well as maroon stool.  He underwent colonoscopy in 2023 which showed internal hemorrhoids along with diverticulosis.  Was found to have 1 polyp as well.  Patient's hemoglobin was noted to be 5.4.  He was hospitalized for further management.  Consultants: Gastroenterology  Procedures: Blood transfusion    Subjective/Interval History:  Reports poor night sleep secondary to multiple BMs from bowel regimen, reports it has cleared, with no stools with BMs, but still reports blood with his clear BMs.     Assessment/Plan:  Hematochezia Came in with hemoglobin of 5.4.  Underwent CT angiogram abdomen which did not show any active bleeding.  Extensive diverticulosis was noted.  No evidence for diverticulitis. Last colonoscopy was in 2023.  Bleeding is likely combination of diverticular as well as hemorrhoidal.  Had significant drop in hemoglobin. GI has been consulted.  Hemodynamically he is stable. Plan for EGD and colonoscopy this afternoon.  Acute blood loss anemia/iron deficiency Hemoglobin was 5.4 at admission.  Secondary to GI blood loss.  Has been transfused.  Monitor CBC and transfuse if hemoglobin drops below 7.  Last hemoglobin is 7.0. Anemia panel shows ferritin of 4, iron of 15, TIBC 379, percent saturation 4.  B12 457.  Folic acid 27.7.  Will benefit from iron supplementation. Globin stable this a.m. at 9.3  Essential hypertension Holding antihypertensives.  Diabetes mellitus type II with hyperglycemia Holding diabetic medications as well.  1 episode of hypoglycemia noted last night.  Monitor CBGs. HbA1c 5.7.  Acute kidney injury Resolved  with IV fluids.  Abnormal UA noted but patient denies any signs or symptoms of UTI.  Chronic hepatitis C Outpatient management.  Cholelithiasis Incidentally noted on CT scan.  LFTs noted to be normal.  DVT Prophylaxis: SCDs Code Status: Full code Family Communication: Discussed with the patient Disposition Plan: Hopefully return home when improved  Status is: Inpatient Remains inpatient appropriate because: GI bleed      Medications: Scheduled:  sodium chloride   Intravenous Once   pantoprazole (PROTONIX) IV  40 mg Intravenous Q12H   Continuous:  lactated ringers     QMV:[id number], ondansetron **OR** ondansetron (ZOFRAN) IV  Antibiotics: Anti-infectives (From admission, onward)    None       Objective:  Vital Signs  Vitals:   09/27/23 0444 09/27/23 0500 09/27/23 0700 09/27/23 0845  BP:    132/71  Pulse:      Resp: (!) 24 (!) 23 20 14   Temp:    98.4 F (36.9 C)  TempSrc:    Oral  SpO2:    100%  Weight:      Height:        Intake/Output Summary (Last 24 hours) at 09/27/2023 1138 Last data filed at 09/26/2023 2200 Gross per 24 hour  Intake 5336 ml  Output 2330 ml  Net 3006 ml   Filed Weights   09/25/23 1218 09/27/23 0359  Weight: 54.4 kg 95.9 kg    Awake Alert, Oriented X 3, No new F.N deficits, Normal affect Symmetrical Chest wall movement, Good air movement bilaterally, CTAB RRR,No Gallops,Rubs or new Murmurs, No Parasternal Heave +ve B.Sounds, Abd Soft, No tenderness, No rebound -  guarding or rigidity. No Cyanosis, Clubbing or edema, No new Rash or bruise      Lab Results:  Data Reviewed: I have personally reviewed following labs and reports of the imaging studies  CBC: Recent Labs  Lab 09/25/23 1235 09/26/23 0510 09/26/23 0925 09/26/23 1635 09/26/23 2050 09/27/23 0439 09/27/23 0909  WBC 9.7   < > 7.9 7.6 7.5 7.3 6.0  NEUTROABS 7.1  --   --   --   --   --   --   HGB 5.4*   < > 7.1* 8.4* 9.0* 9.3* 8.4*  HCT 17.6*   < > 22.2*  25.4* 27.6* 28.5* 26.0*  MCV 91.2   < > 86.7 86.1 86.8 86.9 87.2  PLT 335   < > 318 319 333 375 324   < > = values in this interval not displayed.    Basic Metabolic Panel: Recent Labs  Lab 09/25/23 1235 09/25/23 1542 09/26/23 0510 09/27/23 0439  NA 135  --  135 138  K 4.5  --  4.2 4.3  CL 100  --  102 108  CO2 23  --  23 24  GLUCOSE 81  --  78 117*  BUN 21  --  17 10  CREATININE 1.49*  --  1.19 1.19  CALCIUM 9.4  --  8.7* 9.0  MG  --  1.9 2.0  --   PHOS  --  4.5 4.5  --     GFR: Estimated Creatinine Clearance: 72.6 mL/min (by C-G formula based on SCr of 1.19 mg/dL).  Liver Function Tests: Recent Labs  Lab 09/25/23 1542 09/26/23 0510  AST 22 21  ALT 14 11  ALKPHOS 41 37*  BILITOT 0.6 0.8  PROT 6.7 5.9*  ALBUMIN 3.7 3.2*     Coagulation Profile: Recent Labs  Lab 09/25/23 1542  INR 1.0    Cardiac Enzymes: Recent Labs  Lab 09/25/23 1542  CKTOTAL 215    HbA1C: Recent Labs    09/25/23 1542  HGBA1C 5.7*    CBG: Recent Labs  Lab 09/26/23 1604 09/26/23 2059 09/27/23 0000 09/27/23 0357 09/27/23 0848  GLUCAP 75 115* 84 88 105*    Thyroid Function Tests: Recent Labs    09/25/23 1542  TSH 3.099    Anemia Panel: Recent Labs    09/25/23 1542  VITAMINB12 457  FOLATE 27.7  FERRITIN 4*  TIBC 379  IRON 15*  RETICCTPCT 6.2*    Radiology Studies: CT Angio Abd/Pel W and/or Wo Contrast Result Date: 09/25/2023 CLINICAL DATA:  Lower GI bleed. Low blood pressures. Lethargy. Blood in stool. EXAM: CTA ABDOMEN AND PELVIS WITHOUT AND WITH CONTRAST TECHNIQUE: Multidetector CT imaging of the abdomen and pelvis was performed using the standard protocol during bolus administration of intravenous contrast. Multiplanar reconstructed images and MIPs were obtained and reviewed to evaluate the vascular anatomy. RADIATION DOSE REDUCTION: This exam was performed according to the departmental dose-optimization program which includes automated exposure control,  adjustment of the mA and/or kV according to patient size and/or use of iterative reconstruction technique. CONTRAST:  75mL OMNIPAQUE IOHEXOL 350 MG/ML SOLN COMPARISON:  Abdominal ultrasound 01/11/2017 FINDINGS: VASCULAR Scattered calcified atherosclerotic plaque the aorta and its mesenteric, renal, and iliac artery branches without hemodynamically significant stenosis. No aortic aneurysm or dissection. Review of the MIP images confirms the above findings. NON-VASCULAR Lower chest: No acute abnormality. Hepatobiliary: Cholelithiasis. No evidence of acute cholecystitis. Unremarkable liver. Pancreas: Unremarkable. Spleen: Unremarkable. Adrenals/Urinary Tract: Normal adrenal glands. No urinary calculi or  hydronephrosis. Unremarkable bladder. Stomach/Bowel: Small hiatal hernia. Stomach is otherwise within normal limits. No bowel obstruction. Extensive sigmoid diverticulosis. No evidence of diverticulitis. No evidence of active GI bleeding. Normal appendix. Lymphatic: No lymphadenopathy. Reproductive: No acute abnormality. Other: No free intraperitoneal fluid or air. Musculoskeletal: No acute fracture. IMPRESSION: 1. No evidence of active GI bleeding. 2. Extensive sigmoid diverticulosis. No evidence of diverticulitis. 3. Cholelithiasis. No evidence of acute cholecystitis. 4. Aortic Atherosclerosis (ICD10-I70.0). Electronically Signed   By: Minerva Fester M.D.   On: 09/25/2023 21:22   DG Chest 2 View Result Date: 09/25/2023 CLINICAL DATA:  Fatigue. EXAM: CHEST - 2 VIEW COMPARISON:  Chest radiograph dated 08/02/2018. FINDINGS: The heart size and mediastinal contours are within normal limits. Stable calcified nodule in the left upper lobe, adjacent to the left hilum. No focal consolidation, pleural effusion, or pneumothorax. Degenerative changes of the thoracic spine. No acute osseous abnormality. IMPRESSION: No acute cardiopulmonary findings. Electronically Signed   By: Hart Robinsons M.D.   On: 09/25/2023 16:04        LOS: 2 days   Huey Bienenstock MD  [facility] Pager on [email or web address]  09/27/2023, 11:38 AM

## 2023-09-27 NOTE — Progress Notes (Signed)
   09/27/23 1627  Spiritual Encounters  Type of Visit Initial  Care provided to: Patient  Reason for visit Routine spiritual support   Chaplain responded to a call from Rn that Pt wanted to receive the sacrament of ashes for [facility] Wednesday.  Chaplain services remain available by Spiritual Consult or for emergent cases, paging [phone]  Chaplain Raelene Bott, MDiv [email or web address] [phone]

## 2023-09-27 NOTE — Op Note (Signed)
 [facility] Patient Name: Alejandro [name] Procedure Date : 09/27/2023 MRN: [id number] Attending MD: Napoleon Form , MD, [id number] Date of Birth: December 29, 1960 CSN: [id number] Age: 63 Admit Type: Inpatient Procedure:                Upper GI endoscopy Indications:              Suspected upper gastrointestinal bleeding in                            patient with unexplained iron deficiency anemia,                            Dyspepsia Providers:                Napoleon Form, MD, Norman Clay, RN, Priscella Mann, Technician Referring MD:              Medicines:                Monitored Anesthesia Care Complications:            No immediate complications. Estimated Blood Loss:     Estimated blood loss was minimal. Procedure:                Pre-Anesthesia Assessment:                           - Prior to the procedure, a History and Physical                            was performed, and patient medications and                            allergies were reviewed. The patient's tolerance of                            previous anesthesia was also reviewed. The risks                            and benefits of the procedure and the sedation                            options and risks were discussed with the patient.                            All questions were answered, and informed consent                            was obtained. Prior Anticoagulants: The patient has                            taken no anticoagulant or antiplatelet agents. ASA                            Grade  Assessment: III - A patient with severe                            systemic disease. After reviewing the risks and                            benefits, the patient was deemed in satisfactory                            condition to undergo the procedure.                           After obtaining informed consent, the endoscope was                            passed under direct  vision. Throughout the                            procedure, the patient's blood pressure, pulse, and                            oxygen saturations were monitored continuously. The                            [id number]-[id number] ([id number]) Olympus endoscope was introduced                            through the mouth, and advanced to the second part                            of duodenum. The upper GI endoscopy was                            accomplished without difficulty. The patient                            tolerated the procedure well. Scope In: Scope Out: Findings:      The Z-line was regular and was found 40 cm from the incisors.      A small hiatal hernia was present.      The stomach was normal.      The cardia and gastric fundus were normal on retroflexion.      The examined duodenum was normal. Impression:               - Z-line regular, 40 cm from the incisors.                           - Small hiatal hernia.                           - Normal stomach.                           - Normal examined duodenum.                           -  No specimens collected. Recommendation:           - Patient has a contact number available for                            emergencies. The signs and symptoms of potential                            delayed complications were discussed with the                            patient. Return to normal activities tomorrow.                            Written discharge instructions were provided to the                            patient.                           - Resume previous diet.                           - Continue present medications.                           - See the other procedure note for documentation of                            additional recommendations. Procedure Code(s):        --- Professional ---                           [phone], Esophagogastroduodenoscopy, flexible,                            transoral; diagnostic, including collection of                             specimen(s) by brushing or washing, when performed                            (separate procedure) Diagnosis Code(s):        --- Professional ---                           K44.9, Diaphragmatic hernia without obstruction or                            gangrene                           D50.9, Iron deficiency anemia, unspecified                           R10.13, Epigastric pain CPT copyright 2022 American [employer] Association. All rights reserved. The codes documented in this report are preliminary and upon coder review may  be revised to meet current compliance requirements. Napoleon Form, MD 09/27/2023 2:37:43 PM This report has been signed electronically. Number of Addenda: 0

## 2023-09-27 NOTE — Plan of Care (Signed)
  Problem: Clinical Measurements: Goal: Ability to maintain clinical measurements within normal limits will improve Outcome: Progressing- CBG q 4 hrs, NPO after midnight, no hypo/hyper glycemia.  Goal: Will remain free from infection Outcome: Progressing- afebrile, normal WBC count. Goal: Diagnostic test results will improve Outcome: Progressing- follow lab CBC q 6hrs.  Goal: Respiratory complications will improve Outcome: Progressing- on room air, normal respiratory effort. No distress noted.  Goal: Cardiovascular complication will be avoided Outcome: Progressing: stable hemodynamically, NSR on the monitor.   Problem: Fluid Volume: acute blood loss anemia/ iron deficiency from GI bleeding, presents with bloody stool. Pt got total 3 units of PRBC. Pt is under bowel prep planning for EGD & colonoscopy at am. Pt is well tolerated. Denies abdominal pain, nausea or vomiting.  Goal: Ability to maintain a balanced intake and output will improve Outcome: Progressing   Filiberto Pinks, RN

## 2023-09-27 NOTE — Transfer of Care (Signed)
 Immediate Anesthesia Transfer of Care Note  Patient: Alejandro [name]  Procedure(s) Performed: EGD (ESOPHAGOGASTRODUODENOSCOPY) COLONOSCOPY CONTROL OF HEMORRHAGE, GI TRACT, ENDOSCOPIC, BY CLIPPING OR OVERSEWING  Patient Location: PACU  Anesthesia Type:MAC  Level of Consciousness: sedated and drowsy  Airway & Oxygen Therapy: Patient Spontanous Breathing and Patient connected to face mask oxygen  Post-op Assessment: Report given to RN and Post -op Vital signs reviewed and stable  Post vital signs: Reviewed and stable  Last Vitals:  Vitals Value Taken Time  BP    Temp    Pulse 84 09/27/23 1445  Resp 25 09/27/23 1445  SpO2 100 % 09/27/23 1445    Last Pain:  Vitals:   09/27/23 1245  TempSrc: Temporal  PainSc: 0-No pain         Complications: No notable events documented.

## 2023-09-27 NOTE — Op Note (Signed)
 [facility] Patient Name: Alejandro [name] Procedure Date : 09/27/2023 MRN: [id number] Attending MD: Napoleon Form , MD, [id number] Date of Birth: August 04, 1960 CSN: [id number] Age: 63 Admit Type: Inpatient Procedure:                Colonoscopy Indications:              Evaluation of unexplained GI bleeding presenting                            with Hematochezia, Unexplained iron deficiency                            anemia Providers:                Napoleon Form, MD, Norman Clay, RN, Priscella Mann, Technician Referring MD:              Medicines:                Monitored Anesthesia Care Complications:            No immediate complications. Estimated Blood Loss:     Estimated blood loss was minimal. Procedure:                Pre-Anesthesia Assessment:                           - Prior to the procedure, a History and Physical                            was performed, and patient medications and                            allergies were reviewed. The patient's tolerance of                            previous anesthesia was also reviewed. The risks                            and benefits of the procedure and the sedation                            options and risks were discussed with the patient.                            All questions were answered, and informed consent                            was obtained. Prior Anticoagulants: The patient has                            taken no anticoagulant or antiplatelet agents. ASA                            Grade Assessment: III -  A patient with severe                            systemic disease. After reviewing the risks and                            benefits, the patient was deemed in satisfactory                            condition to undergo the procedure.                           After obtaining informed consent, the colonoscope                            was passed under direct vision.  Throughout the                            procedure, the patient's blood pressure, pulse, and                            oxygen saturations were monitored continuously. The                            [id number]-[id number] ([id number]) Olympus peds colonoscope was                            introduced through the anus and advanced to the the                            cecum, identified by appendiceal orifice and                            ileocecal valve. The colonoscopy was performed                            without difficulty. The patient tolerated the                            procedure well. The quality of the bowel                            preparation was good. The ileocecal valve,                            appendiceal orifice, and rectum were photographed. Scope In: 2:13:25 PM Scope Out: 2:27:03 PM Scope Withdrawal Time: 0 hours 11 minutes 9 seconds  Total Procedure Duration: 0 hours 13 minutes 38 seconds  Findings:      Hemorrhoids were found on perianal exam.      Bleeding internal hemorrhoids were found during retroflexion. The       hemorrhoids were large. To stop active bleeding, two hemostatic clips       were successfully placed. Clip manufacturer: [employer]. There       was no bleeding at the  end of the procedure.      The exam was otherwise without abnormality. Impression:               - Hemorrhoids found on perianal exam.                           - Bleeding internal hemorrhoids. Clips were placed.                            Clip manufacturer: [employer].                           - The examination was otherwise normal.                           - No specimens collected. Recommendation:           - Patient has a contact number available for                            emergencies. The signs and symptoms of potential                            delayed complications were discussed with the                            patient. Return to normal activities tomorrow.                             Written discharge instructions were provided to the                            patient.                           - Mechanical soft diet X 3 days and then advance to                            previous regular diet.                           - Continue present medications.                           - Avoid NSAID's                           - Avoid excessive straining during defecation                           - Follow up with colorectal surgery for hemorrhoid                            ligation/hemorrhoidectomy, can be done as outpatient                           - GI signing off, available  if have any questions Procedure Code(s):        --- Professional ---                           [phone], Colonoscopy, flexible; with control of                            bleeding, any method Diagnosis Code(s):        --- Professional ---                           K64.8, Other hemorrhoids                           K92.1, Melena (includes Hematochezia)                           D50.9, Iron deficiency anemia, unspecified CPT copyright 2022 American Medical Association. All rights reserved. The codes documented in this report are preliminary and upon coder review may  be revised to meet current compliance requirements. Napoleon Form, MD 09/27/2023 2:44:34 PM This report has been signed electronically. Number of Addenda: 0

## 2023-09-28 ENCOUNTER — Other Ambulatory Visit (HOSPITAL_COMMUNITY): Payer: Self-pay

## 2023-09-28 DIAGNOSIS — K648 Other hemorrhoids: Secondary | ICD-10-CM

## 2023-09-28 DIAGNOSIS — D649 Anemia, unspecified: Secondary | ICD-10-CM | POA: Diagnosis not present

## 2023-09-28 LAB — BASIC METABOLIC PANEL
Anion gap: 3 — ABNORMAL LOW (ref 5–15)
BUN: 11 mg/dL (ref 8–23)
CO2: 24 mmol/L (ref 22–32)
Calcium: 8 mg/dL — ABNORMAL LOW (ref 8.9–10.3)
Chloride: 107 mmol/L (ref 98–111)
Creatinine, Ser: 1.18 mg/dL (ref 0.61–1.24)
GFR, Estimated: >60 mL/min (ref >60)
Glucose, Bld: 87 mg/dL (ref 70–99)
Potassium: 4 mmol/L (ref 3.5–5.1)
Sodium: 134 mmol/L — ABNORMAL LOW (ref 135–145)

## 2023-09-28 LAB — GLUCOSE, CAPILLARY
Glucose-Capillary: 86 mg/dL (ref 70–99)
Glucose-Capillary: 87 mg/dL (ref 70–99)
Glucose-Capillary: 117 mg/dL — ABNORMAL HIGH (ref 70–99)

## 2023-09-28 LAB — CBC
HCT: 26.1 % — ABNORMAL LOW (ref 39.0–52.0)
HCT: 29.8 % — ABNORMAL LOW (ref 39.0–52.0)
Hemoglobin: 8.4 g/dL — ABNORMAL LOW (ref 13.0–17.0)
Hemoglobin: 9.6 g/dL — ABNORMAL LOW (ref 13.0–17.0)
MCH: 28.3 pg (ref 26.0–34.0)
MCH: 28.3 pg (ref 26.0–34.0)
MCHC: 32.2 g/dL (ref 30.0–36.0)
MCHC: 32.2 g/dL (ref 30.0–36.0)
MCV: 87.9 fL (ref 80.0–100.0)
MCV: 87.9 fL (ref 80.0–100.0)
Platelets: 314 10*3/uL (ref 150–400)
Platelets: 378 10*3/uL (ref 150–400)
RBC: 2.97 MIL/uL — ABNORMAL LOW (ref 4.22–5.81)
RBC: 3.39 MIL/uL — ABNORMAL LOW (ref 4.22–5.81)
RDW: 14.5 % (ref 11.5–15.5)
RDW: 14.6 % (ref 11.5–15.5)
WBC: 7.6 10*3/uL (ref 4.0–10.5)
WBC: 8.4 10*3/uL (ref 4.0–10.5)
nRBC: 0 % (ref 0.0–0.2)
nRBC: 0.2 % (ref 0.0–0.2)

## 2023-09-28 MED ORDER — IRON SUCROSE 20 MG/ML IV SOLN
250.0000 mg | Freq: Once | INTRAVENOUS | Status: AC
  Administered 2023-09-28: 250 mg via INTRAVENOUS

## 2023-09-28 MED ORDER — METAMUCIL SMOOTH TEXTURE 58.6 % PO POWD: 1.0000 | Freq: Three times a day (TID) | ORAL | Status: AC

## 2023-09-28 MED ORDER — HYDROCORTISONE ACETATE 25 MG RE SUPP
25.0000 mg | Freq: Every day | RECTAL | 0 refills | Status: AC
  Filled 2023-09-28: qty 7, 7d supply, fill #0

## 2023-09-28 MED ORDER — FERROUS SULFATE 325 (65 FE) MG PO TABS
325.0000 mg | ORAL_TABLET | Freq: Two times a day (BID) | ORAL | 0 refills | Status: AC
  Filled 2023-09-28: qty 60, 30d supply, fill #0

## 2023-09-28 MED ORDER — POLYETHYLENE GLYCOL 3350 17 G PO PACK: 17.0000 g | PACK | Freq: Every day | ORAL | Status: AC | PRN

## 2023-09-28 NOTE — Anesthesia Postprocedure Evaluation (Signed)
 Anesthesia Post Note  Patient: Alejandro [name]  Procedure(s) Performed: EGD (ESOPHAGOGASTRODUODENOSCOPY) COLONOSCOPY CONTROL OF HEMORRHAGE, GI TRACT, ENDOSCOPIC, BY CLIPPING OR OVERSEWING     Patient location during evaluation: PACU Anesthesia Type: MAC Level of consciousness: awake and alert Pain management: pain level controlled Vital Signs Assessment: post-procedure vital signs reviewed and stable Respiratory status: spontaneous breathing, nonlabored ventilation, respiratory function stable and patient connected to nasal cannula oxygen Cardiovascular status: stable and blood pressure returned to baseline Postop Assessment: no apparent nausea or vomiting Anesthetic complications: no   No notable events documented.                Mariann Barter

## 2023-09-28 NOTE — Progress Notes (Signed)
 Patient had one episode of bright red blood per stool and ask RN to come assess. Dr. Randol Kern notified. GI was contacted, set of vitals obtained. Dr. Bea Laura ordered cbc x1 stat.

## 2023-09-28 NOTE — Discharge Instructions (Signed)
 Follow with Primary MD Renford Dills, MD in 7 days   Get CBC, CMP,  checked  by Primary MD next visit.    Activity: As tolerated with Full fall precautions use walker/cane & assistance as needed   Disposition Home    Diet: Heart Healthy /carb modified   On your next visit with your primary care physician please Get Medicines reviewed and adjusted.   Please request your Prim.MD to go over all Hospital Tests and Procedure/Radiological results at the follow up, please get all Hospital records sent to your Prim MD by signing hospital release before you go home.   If you experience worsening of your admission symptoms, develop shortness of breath, life threatening emergency, suicidal or homicidal thoughts you must seek medical attention immediately by calling 911 or calling your MD immediately  if symptoms less severe.  You Must read complete instructions/literature along with all the possible adverse reactions/side effects for all the Medicines you take and that have been prescribed to you. Take any new Medicines after you have completely understood and accpet all the possible adverse reactions/side effects.   Do not drive, operating heavy machinery, perform activities at heights, swimming or participation in water activities or provide baby sitting services if your were admitted for syncope or siezures until you have seen by Primary MD or a Neurologist and advised to do so again.  Do not drive when taking Pain medications.    Do not take more than prescribed Pain, Sleep and Anxiety Medications  Special Instructions: If you have smoked or chewed Tobacco  in the last 2 yrs please stop smoking, stop any regular Alcohol  and or any Recreational drug use.  Wear Seat belts while driving.   Please note  You were cared for by a hospitalist during your hospital stay. If you have any questions about your discharge medications or the care you received while you were in the hospital after you  are discharged, you can call the unit and asked to speak with the hospitalist on call if the hospitalist that took care of you is not available. Once you are discharged, your primary care physician will handle any further medical issues. Please note that NO REFILLS for any discharge medications will be authorized once you are discharged, as it is imperative that you return to your primary care physician (or establish a relationship with a primary care physician if you do not have one) for your aftercare needs so that they can reassess your need for medications and monitor your lab values.

## 2023-09-28 NOTE — Discharge Summary (Addendum)
 Physician Discharge Summary  Alejandro [name] WUJ:[id number] DOB: 17-Aug-1960 DOA: 09/25/2023  PCP: Renford Dills, MD  Admit date: 09/25/2023 Discharge date: 09/28/2023  Admitted From: (Home) Disposition:  (Home )  Recommendations for Outpatient Follow-up:  Follow up with PCP in 1-2 weeks Please obtain BMP/CBC in one week Ambulatory referral has been sent to general surgery for bleeding internal hemorrhoids Please adjust antihypertensive medication as needed, please see discussion below Please consider outpatient referral to ID clinic for history of hepatitis C (unclear if treated or not)   Diet recommendation: Heart Healthy / Carb Modified   Brief/Interim Summary: 63 y.o. male with medical history significant of hemorrhoids, HTN HLD, DM2 who presented with fatigue and blood in the stools ongoing for at least 2 weeks. He has noticed blood in the stool as well as maroon stool. He underwent colonoscopy in 2023 which showed internal hemorrhoids along with diverticulosis. Was found to have 1 polyp as well. Patient's hemoglobin was noted to be 5.4. He was hospitalized for further management.  Please see discussion below  Hematochezia Bleeding internal hemorrhoid Came in with hemoglobin of 5.4.  Underwent CT angiogram abdomen which did not show any active bleeding.  Extensive diverticulosis was noted.  No evidence for diverticulitis. Last colonoscopy was in 2023.  Bleeding is likely combination of diverticular as well as hemorrhoidal.  Had significant drop in hemoglobin. GI has been consulted.  Patient went for endoscopy and colonoscopy 09/27/2023, where he was found to have internal hemorrhoids, where clips were placed.  Patient was monitored overnight, hemoglobin remained stable this morning, he was instructed to constipation, to be on scheduled Metamucil and as needed MiraLAX. -Patient had another episode of small amount of hemorrhoidal bleed before discharge, hemoglobin has been repeated came back  stable at 9.6, patient was instructed to come back to ED if significant amount of blood loss with hemorrhoids or continuous hemorrhoidal bleed. -Referral has been sent for general surgery, they will arrange for early follow-up appointment.    Acute blood loss anemia/iron deficiency anemia Hemoglobin was 5.4 at admission.  Secondary to GI blood loss.  Has been transfused. Hgb remained stable over last 48 hours . - Anemia panel shows ferritin of 4, iron of 15, TIBC 379, percent saturation 4.  B12 457.  Folic acid 27.7.  Will benefit from iron supplementation.  He received IV iron x 2 during hospital stay, he will be discharged on oral iron.    Essential hypertension Blood pressure has been normal to soft during hospital stay, with all his home medication has been held, so at time of discharge I only continued with valsartan, and held his amlodipine and hydrochlorothiazide, discussed with him to follow with PCP if these need to be resumed if blood pressure started to increase.   Diabetes mellitus type II with hyperglycemia Holding diabetic medications as well been n.p.o. for his procedure, he had 1 episode of hypoglycemia during hospital stay, his A1c is 5.7 -Given low CBGs during hospital stay despite being on a regular diet, I have discontinued his metformin and he was kept only on Trulicity.   Acute kidney injury Resolved with IV fluids.  Abnormal UA noted but patient denies any signs or symptoms of UTI.   Chronic hepatitis C Outpatient management.   Cholelithiasis Incidentally noted on CT scan.  LFTs noted to be normal.  Discharge Diagnoses:  Principal Problem:   Symptomatic anemia Active Problems:   Chronic hepatitis C virus infection (HCC)   Blood in stool   Type  2 diabetes mellitus with hyperglycemia, without long-term current use of insulin (HCC)   Essential hypertension   AKI (acute kidney injury) (HCC)   Hypoglycemia   Gastrointestinal hemorrhage   Bleeding internal  hemorrhoids    Discharge Instructions  Discharge Instructions     Ambulatory referral to General Surgery   Complete by: As directed    Need referral to colorectal surgery  for internal hemorrhoids   What is the reason for referral?: Other   Discharge instructions   Complete by: As directed    Follow with Primary MD Renford Dills, MD in 7 days   Get CBC, CMP,  checked  by Primary MD next visit.    Activity: As tolerated with Full fall precautions use walker/cane & assistance as needed   Disposition Home    Diet: Heart Healthy /carb modified   On your next visit with your primary care physician please Get Medicines reviewed and adjusted.   Please request your Prim.MD to go over all Hospital Tests and Procedure/Radiological results at the follow up, please get all Hospital records sent to your Prim MD by signing hospital release before you go home.   If you experience worsening of your admission symptoms, develop shortness of breath, life threatening emergency, suicidal or homicidal thoughts you must seek medical attention immediately by calling 911 or calling your MD immediately  if symptoms less severe.  You Must read complete instructions/literature along with all the possible adverse reactions/side effects for all the Medicines you take and that have been prescribed to you. Take any new Medicines after you have completely understood and accpet all the possible adverse reactions/side effects.   Do not drive, operating heavy machinery, perform activities at heights, swimming or participation in water activities or provide baby sitting services if your were admitted for syncope or siezures until you have seen by Primary MD or a Neurologist and advised to do so again.  Do not drive when taking Pain medications.    Do not take more than prescribed Pain, Sleep and Anxiety Medications  Special Instructions: If you have smoked or chewed Tobacco  in the last 2 yrs please stop  smoking, stop any regular Alcohol  and or any Recreational drug use.  Wear Seat belts while driving.   Please note  You were cared for by a hospitalist during your hospital stay. If you have any questions about your discharge medications or the care you received while you were in the hospital after you are discharged, you can call the unit and asked to speak with the hospitalist on call if the hospitalist that took care of you is not available. Once you are discharged, your primary care physician will handle any further medical issues. Please note that NO REFILLS for any discharge medications will be authorized once you are discharged, as it is imperative that you return to your primary care physician (or establish a relationship with a primary care physician if you do not have one) for your aftercare needs so that they can reassess your need for medications and monitor your lab values.   Increase activity slowly   Complete by: As directed       Allergies as of 09/28/2023       Reactions   Tylenol [acetaminophen] Hypertension        Medication List     STOP taking these medications    amLODipine 10 MG tablet Commonly known as: NORVASC   hydrochlorothiazide 25 MG tablet Commonly known as: HYDRODIURIL  metFORMIN 1000 MG tablet Commonly known as: GLUCOPHAGE   naproxen sodium 550 MG tablet Commonly known as: Anaprox DS       TAKE these medications    atorvastatin 40 MG tablet Commonly known as: LIPITOR Take 1 tablet (40 mg total) by mouth daily.   Blood Pressure Cuff Misc Use as directed   cyclobenzaprine 5 MG tablet Commonly known as: FLEXERIL Take 1 tablet (5 mg total) by mouth 3 (three) times daily as needed for muscle spasms.   ferrous sulfate 325 (65 FE) MG tablet Take 1 tablet (325 mg total) by mouth 2 (two) times daily.   gabapentin 400 MG capsule Commonly known as: NEURONTIN Take 1 capsule (400 mg total) by mouth 3 (three) times daily.   Metamucil  Smooth Texture 58.6 % powder Generic drug: psyllium Take 1 packet by mouth 3 (three) times daily.   ONE TOUCH ULTRA 2 w/Device Kit USE AS DIRECTED TO TEST BLOOD SUGAR IN THE MORNING   Blood Glucose Monitoring Suppl Kit 1 application by Does not apply route every morning.   OneTouch Verio test strip Generic drug: glucose blood USE ONE STRIP TO CHECK GLUCOSE TWICE DAILY   polyethylene glycol 17 g packet Commonly known as: MiraLax Take 17 g by mouth daily as needed.   Trulicity 1.5 MG/0.5ML Soaj Generic drug: Dulaglutide Inject 1.5 mg into the skin once a week.   valsartan 80 MG tablet Commonly known as: Diovan Take 1 tablet (80 mg total) by mouth daily.        Allergies  Allergen Reactions   Tylenol [Acetaminophen] Hypertension    Consultations: GI   Procedures/Studies: CT Angio Abd/Pel W and/or Wo Contrast Result Date: 09/25/2023 CLINICAL DATA:  Lower GI bleed. Low blood pressures. Lethargy. Blood in stool. EXAM: CTA ABDOMEN AND PELVIS WITHOUT AND WITH CONTRAST TECHNIQUE: Multidetector CT imaging of the abdomen and pelvis was performed using the standard protocol during bolus administration of intravenous contrast. Multiplanar reconstructed images and MIPs were obtained and reviewed to evaluate the vascular anatomy. RADIATION DOSE REDUCTION: This exam was performed according to the departmental dose-optimization program which includes automated exposure control, adjustment of the mA and/or kV according to patient size and/or use of iterative reconstruction technique. CONTRAST:  75mL OMNIPAQUE IOHEXOL 350 MG/ML SOLN COMPARISON:  Abdominal ultrasound 01/11/2017 FINDINGS: VASCULAR Scattered calcified atherosclerotic plaque the aorta and its mesenteric, renal, and iliac artery branches without hemodynamically significant stenosis. No aortic aneurysm or dissection. Review of the MIP images confirms the above findings. NON-VASCULAR Lower chest: No acute abnormality. Hepatobiliary:  Cholelithiasis. No evidence of acute cholecystitis. Unremarkable liver. Pancreas: Unremarkable. Spleen: Unremarkable. Adrenals/Urinary Tract: Normal adrenal glands. No urinary calculi or hydronephrosis. Unremarkable bladder. Stomach/Bowel: Small hiatal hernia. Stomach is otherwise within normal limits. No bowel obstruction. Extensive sigmoid diverticulosis. No evidence of diverticulitis. No evidence of active GI bleeding. Normal appendix. Lymphatic: No lymphadenopathy. Reproductive: No acute abnormality. Other: No free intraperitoneal fluid or air. Musculoskeletal: No acute fracture. IMPRESSION: 1. No evidence of active GI bleeding. 2. Extensive sigmoid diverticulosis. No evidence of diverticulitis. 3. Cholelithiasis. No evidence of acute cholecystitis. 4. Aortic Atherosclerosis (ICD10-I70.0). Electronically Signed   By: Minerva Fester M.D.   On: 09/25/2023 21:22   DG Chest 2 View Result Date: 09/25/2023 CLINICAL DATA:  Fatigue. EXAM: CHEST - 2 VIEW COMPARISON:  Chest radiograph dated 08/02/2018. FINDINGS: The heart size and mediastinal contours are within normal limits. Stable calcified nodule in the left upper lobe, adjacent to the left hilum. No focal consolidation, pleural  effusion, or pneumothorax. Degenerative changes of the thoracic spine. No acute osseous abnormality. IMPRESSION: No acute cardiopulmonary findings. Electronically Signed   By: Hart Robinsons M.D.   On: 09/25/2023 16:04      Subjective: No significant events overnight, he denies any complaints.  Patient had small amount of hemorrhoidal bleed this morning as well, he denies any dizziness, lightheadedness, vital signs are stable, repeat hemoglobin after this episode is reassuring at 9.6.  Discharge Exam: Vitals:   09/28/23 0434 09/28/23 0803  BP: 106/60 125/63  Pulse: 76 86  Resp: 20 19  Temp: 98.2 F (36.8 C) 98.7 F (37.1 C)  SpO2: 100% 100%   Vitals:   09/28/23 0000 09/28/23 0026 09/28/23 0434 09/28/23 0803  BP:  (!)  120/58 106/60 125/63  Pulse: 77 73 76 86  Resp: 18 18 20 19   Temp:  97.8 F (36.6 C) 98.2 F (36.8 C) 98.7 F (37.1 C)  TempSrc:  Oral Oral Oral  SpO2: 100% 99% 100% 100%  Weight:      Height:        General: Pt is alert, awake, not in acute distress Cardiovascular: RRR, S1/S2 +, no rubs, no gallops Respiratory: CTA bilaterally, no wheezing, no rhonchi Abdominal: Soft, NT, ND, bowel sounds + Extremities: no edema, no cyanosis    The results of significant diagnostics from this hospitalization (including imaging, microbiology, ancillary and laboratory) are listed below for reference.     Microbiology: No results found for this or any previous visit (from the past 240 hours).   Labs: BNP (last 3 results) No results for input(s): "BNP" in the last 8760 hours. Basic Metabolic Panel: Recent Labs  Lab 09/25/23 1235 09/25/23 1542 09/26/23 0510 09/27/23 0439 09/28/23 0512  NA 135  --  135 138 134*  K 4.5  --  4.2 4.3 4.0  CL 100  --  102 108 107  CO2 23  --  23 24 24   GLUCOSE 81  --  78 117* 87  BUN 21  --  17 10 11   CREATININE 1.49*  --  1.19 1.19 1.18  CALCIUM 9.4  --  8.7* 9.0 8.0*  MG  --  1.9 2.0  --   --   PHOS  --  4.5 4.5  --   --    Liver Function Tests: Recent Labs  Lab 09/25/23 1542 09/26/23 0510  AST 22 21  ALT 14 11  ALKPHOS 41 37*  BILITOT 0.6 0.8  PROT 6.7 5.9*  ALBUMIN 3.7 3.2*   No results for input(s): "LIPASE", "AMYLASE" in the last 168 hours. No results for input(s): "AMMONIA" in the last 168 hours. CBC: Recent Labs  Lab 09/25/23 1235 09/26/23 0510 09/26/23 1635 09/26/23 2050 09/27/23 0439 09/27/23 0909 09/28/23 0512  WBC 9.7   < > 7.6 7.5 7.3 6.0 7.6  NEUTROABS 7.1  --   --   --   --   --   --   HGB 5.4*   < > 8.4* 9.0* 9.3* 8.4* 8.4*  HCT 17.6*   < > 25.4* 27.6* 28.5* 26.0* 26.1*  MCV 91.2   < > 86.1 86.8 86.9 87.2 87.9  PLT 335   < > 319 333 375 324 314   < > = values in this interval not displayed.   Cardiac  Enzymes: Recent Labs  Lab 09/25/23 1542  CKTOTAL 215   BNP: Invalid input(s): "POCBNP" CBG: Recent Labs  Lab 09/27/23 1252 09/27/23 2008 09/27/23 2358 09/28/23 0410  09/28/23 0805  GLUCAP 80 79 84 86 87   D-Dimer No results for input(s): "DDIMER" in the last 72 hours. Hgb A1c Recent Labs    09/25/23 1542  HGBA1C 5.7*   Lipid Profile No results for input(s): "CHOL", "HDL", "LDLCALC", "TRIG", "CHOLHDL", "LDLDIRECT" in the last 72 hours. Thyroid function studies Recent Labs    09/25/23 1542  TSH 3.099   Anemia work up Recent Labs    09/25/23 1542  VITAMINB12 457  FOLATE 27.7  FERRITIN 4*  TIBC 379  IRON 15*  RETICCTPCT 6.2*   Urinalysis    Component Value Date/Time   COLORURINE YELLOW 09/26/2023 0252   APPEARANCEUR CLEAR 09/26/2023 0252   LABSPEC 1.023 09/26/2023 0252   PHURINE 6.0 09/26/2023 0252   GLUCOSEU NEGATIVE 09/26/2023 0252   HGBUR NEGATIVE 09/26/2023 0252   BILIRUBINUR NEGATIVE 09/26/2023 0252   BILIRUBINUR neg 03/27/2015 1233   KETONESUR NEGATIVE 09/26/2023 0252   PROTEINUR NEGATIVE 09/26/2023 0252   UROBILINOGEN 0.2 03/27/2015 1233   NITRITE NEGATIVE 09/26/2023 0252   LEUKOCYTESUR LARGE (A) 09/26/2023 0252   Sepsis Labs Recent Labs  Lab 09/26/23 2050 09/27/23 0439 09/27/23 0909 09/28/23 0512  WBC 7.5 7.3 6.0 7.6   Microbiology No results found for this or any previous visit (from the past 240 hours).   Time coordinating discharge: Over 30 minutes  SIGNED:   Huey Bienenstock, MD  [facility] 09/28/2023, 10:01 AM Pager   If 7PM-7AM, please contact night-coverage [email or web address] Password [email or web address]

## 2023-09-28 NOTE — Care Management (Signed)
 Transition of Care [facility]) - Inpatient Brief Assessment   Patient Details  Name: Alejandro [name] MRN: [id number] Date of Birth: [birth year]  [facility] of [facility]) CM/SW Contact:    Lockie Pares, RN Phone Number: 09/28/2023, 9:06 AM   Clinical Narrative:  No needs identified at this time. The patient will be discussed in daily progression rounds. If a need is identified,please place a TOC consult  Transition of Care Asessment: Insurance and Status: Insurance coverage has been reviewed Patient has primary care physician: Yes Home environment has been reviewed: Lives with spouse apartment Prior level of function:: Independent Prior/Current Home Services: No current home services Social Drivers of Health Review: SDOH reviewed no interventions necessary Readmission risk has been reviewed: Yes Transition of care needs: no transition of care needs at this time

## 2023-09-28 NOTE — Plan of Care (Signed)
 Reviewing plan of care. Pt is progressing. No report of bloody stool today. No pain. Stable hemodynamically, afebrile, normal respiration. No acute distress overnight. He is able to sleep well with no complaints.  Problem: Education: Goal: Ability to describe self-care measures that may prevent or decrease complications (Diabetes Survival Skills Education) will improve Outcome: Progressing Goal: Individualized Educational Video(s) Outcome: Progressing   Problem: Fluid Volume: Goal: Ability to maintain a balanced intake and output will improve Outcome: Progressing   Problem: Health Behavior/Discharge Planning: Goal: Ability to identify and utilize available resources and services will improve Outcome: Progressing Goal: Ability to manage health-related needs will improve Outcome: Progressing   Problem: Metabolic: Goal: Ability to maintain appropriate glucose levels will improve Outcome: Progressing   Problem: Nutritional: Goal: Maintenance of adequate nutrition will improve Outcome: Progressing Goal: Progress toward achieving an optimal weight will improve Outcome: Progressing   Problem: Clinical Measurements: Goal: Ability to maintain clinical measurements within normal limits will improve Outcome: Progressing Goal: Will remain free from infection Outcome: Progressing Goal: Diagnostic test results will improve Outcome: Progressing Goal: Respiratory complications will improve Outcome: Progressing Goal: Cardiovascular complication will be avoided Outcome: Progressing   Problem: Elimination: Goal: Will not experience complications related to bowel motility Outcome: Progressing Goal: Will not experience complications related to urinary retention Outcome: Progressing   Filiberto Pinks, RN

## 2023-10-01 ENCOUNTER — Encounter (HOSPITAL_COMMUNITY): Payer: Self-pay | Admitting: Gastroenterology

## 2024-05-25 ENCOUNTER — Emergency Department (HOSPITAL_COMMUNITY)
Admission: EM | Admit: 2024-05-25 | Discharge: 2024-05-25 | Disposition: A | Discharge status: Home / Self Care | Attending: Emergency Medicine | Admitting: Emergency Medicine

## 2024-05-25 ENCOUNTER — Other Ambulatory Visit: Payer: Self-pay

## 2024-05-25 DIAGNOSIS — R350 Frequency of micturition: Secondary | ICD-10-CM | POA: Diagnosis not present

## 2024-05-25 DIAGNOSIS — R739 Hyperglycemia, unspecified: Secondary | ICD-10-CM | POA: Diagnosis present

## 2024-05-25 DIAGNOSIS — E1165 Type 2 diabetes mellitus with hyperglycemia: Secondary | ICD-10-CM | POA: Diagnosis not present

## 2024-05-25 LAB — CBG MONITORING, ED
Glucose-Capillary: 342 mg/dL — ABNORMAL HIGH (ref 70–99)
Glucose-Capillary: 235 mg/dL — ABNORMAL HIGH (ref 70–99)

## 2024-05-25 LAB — I-STAT CHEM 8, ED
BUN: 20 mg/dL (ref 8–23)
BUN: 20 mg/dL (ref 8–23)
Calcium, Ion: 1.15 mmol/L (ref 1.15–1.40)
Calcium, Ion: 1.14 mmol/L — ABNORMAL LOW (ref 1.15–1.40)
Chloride: 97 mmol/L — ABNORMAL LOW (ref 98–111)
Chloride: 97 mmol/L — ABNORMAL LOW (ref 98–111)
Creatinine, Ser: 1.1 mg/dL (ref 0.61–1.24)
Creatinine, Ser: 1.1 mg/dL (ref 0.61–1.24)
Glucose, Bld: 339 mg/dL — ABNORMAL HIGH (ref 70–99)
Glucose, Bld: 318 mg/dL — ABNORMAL HIGH (ref 70–99)
HCT: 45 % (ref 39.0–52.0)
HCT: 44 % (ref 39.0–52.0)
Hemoglobin: 15.3 g/dL (ref 13.0–17.0)
Hemoglobin: 15 g/dL (ref 13.0–17.0)
Potassium: 4.2 mmol/L (ref 3.5–5.1)
Potassium: 4.4 mmol/L (ref 3.5–5.1)
Sodium: 131 mmol/L — ABNORMAL LOW (ref 135–145)
Sodium: 132 mmol/L — ABNORMAL LOW (ref 135–145)
TCO2: 22 mmol/L (ref 22–32)
TCO2: 24 mmol/L (ref 22–32)

## 2024-05-25 LAB — I-STAT VENOUS BLOOD GAS, ED
Acid-Base Excess: 2 mmol/L (ref 0.0–2.0)
Bicarbonate: 26.2 mmol/L (ref 20.0–28.0)
Calcium, Ion: 1.12 mmol/L — ABNORMAL LOW (ref 1.15–1.40)
HCT: 43 % (ref 39.0–52.0)
Hemoglobin: 14.6 g/dL (ref 13.0–17.0)
O2 Saturation: 94 %
Potassium: 4.4 mmol/L (ref 3.5–5.1)
Sodium: 132 mmol/L — ABNORMAL LOW (ref 135–145)
TCO2: 27 mmol/L (ref 22–32)
pCO2, Ven: 40.2 mmHg — ABNORMAL LOW (ref 44–60)
pH, Ven: 7.423 (ref 7.25–7.43)
pO2, Ven: 69 mmHg — ABNORMAL HIGH (ref 32–45)

## 2024-05-25 LAB — URINALYSIS, ROUTINE W REFLEX MICROSCOPIC
Bacteria, UA: NONE SEEN
Bilirubin Urine: NEGATIVE
Glucose, UA: >=500 mg/dL — AB
Ketones, ur: NEGATIVE mg/dL
Leukocytes,Ua: NEGATIVE
Nitrite: NEGATIVE
Protein, ur: NEGATIVE mg/dL
Specific Gravity, Urine: 1.005 (ref 1.005–1.030)
pH: 7 (ref 5.0–8.0)

## 2024-05-25 LAB — CBC
HCT: 40.7 % (ref 39.0–52.0)
Hemoglobin: 13.3 g/dL (ref 13.0–17.0)
MCH: 28 pg (ref 26.0–34.0)
MCHC: 32.7 g/dL (ref 30.0–36.0)
MCV: 85.7 fL (ref 80.0–100.0)
Platelets: 256 K/uL (ref 150–400)
RBC: 4.75 MIL/uL (ref 4.22–5.81)
RDW: 13.2 % (ref 11.5–15.5)
WBC: 12 K/uL — ABNORMAL HIGH (ref 4.0–10.5)
nRBC: 0 % (ref 0.0–0.2)

## 2024-05-25 LAB — BASIC METABOLIC PANEL WITH GFR
Anion gap: 13 (ref 5–15)
BUN: 17 mg/dL (ref 8–23)
CO2: 23 mmol/L (ref 22–32)
Calcium: 9.2 mg/dL (ref 8.9–10.3)
Chloride: 95 mmol/L — ABNORMAL LOW (ref 98–111)
Creatinine, Ser: 1.04 mg/dL (ref 0.61–1.24)
GFR, Estimated: >60 mL/min (ref >60)
Glucose, Bld: 316 mg/dL — ABNORMAL HIGH (ref 70–99)
Potassium: 4.4 mmol/L (ref 3.5–5.1)
Sodium: 131 mmol/L — ABNORMAL LOW (ref 135–145)

## 2024-05-25 LAB — BETA-HYDROXYBUTYRIC ACID: Beta-Hydroxybutyric Acid: 0.11 mmol/L (ref 0.05–0.27)

## 2024-05-25 LAB — LACTIC ACID, PLASMA: Lactic Acid, Venous: 1.5 mmol/L (ref 0.5–1.9)

## 2024-05-25 MED ORDER — METFORMIN HCL 500 MG PO TABS: 500.0000 mg | ORAL_TABLET | Freq: Every day | ORAL | 0 refills | Status: AC

## 2024-05-25 MED ORDER — INSULIN ASPART 100 UNIT/ML IJ SOLN
5.0000 [IU] | Freq: Once | INTRAMUSCULAR | Status: AC
  Administered 2024-05-25: 5 [IU] via INTRAVENOUS

## 2024-05-25 MED ORDER — LACTATED RINGERS IV BOLUS
1000.0000 mL | Freq: Once | INTRAVENOUS | Status: AC
  Administered 2024-05-25: 1000 mL via INTRAVENOUS

## 2024-05-25 MED ORDER — IRBESARTAN 150 MG PO TABS
75.0000 mg | ORAL_TABLET | Freq: Every day | ORAL | Status: DC
  Administered 2024-05-25: 75 mg via ORAL
  Filled 2024-05-25: qty 1

## 2024-05-25 NOTE — Discharge Instructions (Addendum)
 Check your blood sugar at least twice daily and keep a record for your doctor to review.  We are restarting metformin  today which you should ask your doctor if you should continue.

## 2024-05-25 NOTE — ED Provider Notes (Signed)
  Physical Exam  BP (!) 171/85 (BP Location: Left Arm)   Pulse 63   Temp 97.7 F (36.5 C) (Oral)   Resp 18   Ht 5' 10 (1.778 m)   Wt 99.8 kg   SpO2 100%   BMI 31.57 kg/m   Physical Exam  Procedures  Procedures  ED Course / MDM    Medical Decision Making Amount and/or Complexity of Data Reviewed Labs: ordered.  Risk Prescription drug management.   Received care of patient from Dr. Chad.  Please see his note for prior history, physical and care.  Briefly this is a 63 year old male with a history of diabetes who presented with concern for hyperglycemia, polyuria. Labs completed and personally about interpreted by me show hyperglycemia without signs of DKA.  Plan for him to receive IV fluids, insulin , his blood pressure medication.  Recheck blood sugar is***

## 2024-05-25 NOTE — ED Triage Notes (Signed)
 States he is currently taking predinsone and his blood sugar was elevated this am

## 2024-05-25 NOTE — ED Notes (Signed)
 CBG 235

## 2024-05-25 NOTE — ED Provider Notes (Addendum)
 [place] [facility]ERGENCY DEPARTMENT AT [facility] Provider Note   CSN: [id number] Arrival date & time: 05/25/24  0416     Patient presents with: Hyperglycemia   Alejandro [name] is a 63 y.o. male.   Patient with a history of diabetes on Trulicity  here with elevated blood sugar and polyuria and frequency.  States he is urinating 3 times between 2 and 3 AM which is atypical for him.  Felt well going to bed last night.  He checked his blood sugar and found it to be 345.  Does not normally have blood sugars in this range.  He has been taking methylprednisolone for his low back for the past 2 days.  He is also been prescribed ibuprofen  and Flexeril .  Does have a history of chronic back problems.  Denies any new fall or injury.  No focal weakness, numbness or tingling.  No fever, chills, nausea, vomiting, chest pain or shortness of breath.  No bowel or bladder incontinence.  No history of IV drug abuse or cancer.  Hypertensive on arrival. Became concerned that his blood sugar was elevated and was having urinary frequency so he came to the ED this morning.  The history is provided by the patient.  Hyperglycemia Associated symptoms: no abdominal pain, no chest pain, no dizziness, no dysuria, no fever, no nausea, no shortness of breath, no vomiting and no weakness        Prior to Admission medications   Medication Sig Start Date End Date Taking? Authorizing Provider  atorvastatin  (LIPITOR) 40 MG tablet Take 1 tablet (40 mg total) by mouth daily. 08/19/20   Just, Kelsea J, FNP  Blood Glucose Monitoring Suppl (ONE TOUCH ULTRA 2) w/Device KIT USE AS DIRECTED TO TEST BLOOD SUGAR IN THE MORNING 08/07/16   Isadora Krabbe D, PA  Blood Glucose Monitoring Suppl KIT 1 application by Does not apply route every morning. 04/30/20   Melonie Tori Mikel CHRISTELLA, MD  Blood Pressure Monitoring (BLOOD PRESSURE CUFF) MISC Use as directed 07/07/17   Loreli Elyn SAILOR, MD  cyclobenzaprine  (FLEXERIL ) 5 MG tablet Take 1  tablet (5 mg total) by mouth 3 (three) times daily as needed for muscle spasms. 01/14/23   White, Shelba SAUNDERS, NP  Dulaglutide  (TRULICITY ) 1.5 MG/0.5ML SOPN Inject 1.5 mg into the skin once a week. 08/19/20   Just, Kelsea J, FNP  ferrous sulfate  325 (65 FE) MG tablet Take 1 tablet (325 mg total) by mouth 2 (two) times daily. 09/28/23 10/28/23  Elgergawy, Brayton RAMAN, MD  gabapentin  (NEURONTIN ) 400 MG capsule Take 1 capsule (400 mg total) by mouth 3 (three) times daily. 08/19/20   Just, Kelsea J, FNP  glucose blood (ONETOUCH VERIO) test strip USE ONE STRIP TO CHECK GLUCOSE TWICE DAILY 04/23/19   Melonie Tori, Mikel CHRISTELLA, MD  polyethylene glycol (MIRALAX ) 17 g packet Take 17 g by mouth daily as needed. 09/28/23   Elgergawy, Dawood S, MD  psyllium (METAMUCIL SMOOTH TEXTURE) 58.6 % powder Take 1 packet by mouth 3 (three) times daily. 09/28/23   Elgergawy, Brayton RAMAN, MD  valsartan  (DIOVAN ) 80 MG tablet Take 1 tablet (80 mg total) by mouth daily. 08/19/20   Just, Kelsea J, FNP    Allergies: Tylenol [acetaminophen]    Review of Systems  Constitutional:  Negative for activity change, appetite change and fever.  HENT:  Negative for congestion and rhinorrhea.   Respiratory:  Negative for cough, chest tightness and shortness of breath.   Cardiovascular:  Negative for chest pain.  Gastrointestinal:  Negative for abdominal pain, nausea and vomiting.  Genitourinary:  Negative for dysuria and hematuria.  Musculoskeletal:  Positive for arthralgias, back pain and myalgias.  Skin:  Negative for rash.  Neurological:  Negative for dizziness, weakness and headaches.   all other systems are negative except as noted in the HPI and PMH.    Updated Vital Signs BP (!) 188/134 (BP Location: Right Arm)   Pulse 82   Temp 97.7 F (36.5 C) (Oral)   Resp 18   Ht 5' 10 (1.778 m)   Wt 99.8 kg   SpO2 98%   BMI 31.57 kg/m   Physical Exam Vitals and nursing note reviewed.  Constitutional:      General: He is not in acute distress.     Appearance: He is well-developed.  HENT:     Head: Normocephalic and atraumatic.     Mouth/Throat:     Pharynx: No oropharyngeal exudate.  Eyes:     Conjunctiva/sclera: Conjunctivae normal.     Pupils: Pupils are equal, round, and reactive to light.  Neck:     Comments: No meningismus. Cardiovascular:     Rate and Rhythm: Normal rate and regular rhythm.     Heart sounds: Normal heart sounds. No murmur heard. Pulmonary:     Effort: Pulmonary effort is normal. No respiratory distress.     Breath sounds: Normal breath sounds.  Chest:     Chest wall: No tenderness.  Abdominal:     Palpations: Abdomen is soft.     Tenderness: There is no abdominal tenderness. There is no guarding or rebound.  Musculoskeletal:        General: No tenderness. Normal range of motion.     Cervical back: Normal range of motion and neck supple.     Comments: No midline tenderness. 5/5 strength in bilateral lower extremities. Ankle plantar and dorsiflexion intact. Great toe extension intact bilaterally. +2 DP and PT pulses. +2 patellar reflexes bilaterally. Normal gait.   Skin:    General: Skin is warm.  Neurological:     Mental Status: He is alert and oriented to person, place, and time.     Cranial Nerves: No cranial nerve deficit.     Motor: No abnormal muscle tone.     Coordination: Coordination normal.     Comments:  5/5 strength throughout. CN 2-12 intact.Equal grip strength.   Psychiatric:        Behavior: Behavior normal.     (all labs ordered are listed, but only abnormal results are displayed) Labs Reviewed  CBC - Abnormal; Notable for the following components:      Result Value   WBC 12.0 (*)    All other components within normal limits  URINALYSIS, ROUTINE W REFLEX MICROSCOPIC - Abnormal; Notable for the following components:   Color, Urine COLORLESS (*)    Glucose, UA >=500 (*)    Hgb urine dipstick SMALL (*)    All other components within normal limits  BASIC METABOLIC PANEL WITH  GFR - Abnormal; Notable for the following components:   Sodium 131 (*)    Chloride 95 (*)    Glucose, Bld 316 (*)    All other components within normal limits  CBG MONITORING, ED - Abnormal; Notable for the following components:   Glucose-Capillary 342 (*)    All other components within normal limits  I-STAT VENOUS BLOOD GAS, ED - Abnormal; Notable for the following components:   pCO2, Ven 40.2 (*)    pO2, Ven  69 (*)    Sodium 132 (*)    Calcium , Ion 1.12 (*)    All other components within normal limits  I-STAT CHEM 8, ED - Abnormal; Notable for the following components:   Sodium 132 (*)    Chloride 97 (*)    Glucose, Bld 318 (*)    Calcium , Ion 1.14 (*)    All other components within normal limits  I-STAT CHEM 8, ED - Abnormal; Notable for the following components:   Sodium 131 (*)    Chloride 97 (*)    Glucose, Bld 339 (*)    All other components within normal limits  LACTIC ACID, PLASMA  BETA-HYDROXYBUTYRIC ACID  LACTIC ACID, PLASMA  CBG MONITORING, ED  CBG MONITORING, ED    EKG: None  Radiology: No results found.   Procedures   Medications Ordered in the ED  lactated ringers  bolus 1,000 mL (has no administration in time range)  insulin  aspart (novoLOG ) injection 5 Units (has no administration in time range)                                    Medical Decision Making Amount and/or Complexity of Data Reviewed Labs: ordered. Decision-making details documented in ED Course. Radiology: ordered and independent interpretation performed. Decision-making details documented in ED Course. ECG/medicine tests: ordered and independent interpretation performed. Decision-making details documented in ED Course.  Risk Prescription drug management.   Urinary frequency, urgency, hyperglycemia with recent steroid use.  Denies chest pain or abdominal pain.  Doubt DKA but will check labs and hydrate.  Low concern for cord compression or cauda equina.  Patient given IV  fluids and IV insulin .  Labs are reassuring without evidence of DKA.  Urinalysis is negative without ketones.  Patient admits to dietary indiscretion over the past several days as well as taking his steroids. Hypertensive on arrival did not take his blood pressure medicine for the past 2 days.  No headache, chest pain, shortness of breath or abdominal pain.  Patient feeling improved after fluids and insulin .  Will recheck blood sugar and give his blood pressure medication.  Plan to follow-up with PCP for further adjustments of his diabetes regimen.  He states he was taken off metformin  sometime ago and does not take any thing other than Trulicity .  Does not check his sugars on a regular basis. He stopped metformin  in March after a blood transfusion but is not sure why.  Discussed checking his sugar at least twice daily and following up with his primary doctor for further adjustments of his diabetes regimen.  Care transferred at shift change pending improvement in blood pressure and blood sugar.      Final diagnoses:  None    ED Discharge Orders     None          Varnika Butz, Garnette, MD 05/25/24 SHERRIAN Carita Garnette, MD 05/25/24 [phone]

## 2024-08-19 ENCOUNTER — Encounter (HOSPITAL_BASED_OUTPATIENT_CLINIC_OR_DEPARTMENT_OTHER): Payer: Self-pay

## 2024-08-19 ENCOUNTER — Other Ambulatory Visit: Payer: Self-pay

## 2024-08-19 ENCOUNTER — Emergency Department (HOSPITAL_BASED_OUTPATIENT_CLINIC_OR_DEPARTMENT_OTHER): Admission: EM | Admit: 2024-08-19 | Discharge: 2024-08-19 | Disposition: A | Discharge status: Home / Self Care

## 2024-08-19 DIAGNOSIS — Z79899 Other long term (current) drug therapy: Secondary | ICD-10-CM | POA: Insufficient documentation

## 2024-08-19 DIAGNOSIS — E1165 Type 2 diabetes mellitus with hyperglycemia: Secondary | ICD-10-CM | POA: Diagnosis not present

## 2024-08-19 DIAGNOSIS — I10 Essential (primary) hypertension: Secondary | ICD-10-CM | POA: Insufficient documentation

## 2024-08-19 DIAGNOSIS — R739 Hyperglycemia, unspecified: Secondary | ICD-10-CM

## 2024-08-19 DIAGNOSIS — Z7984 Long term (current) use of oral hypoglycemic drugs: Secondary | ICD-10-CM | POA: Diagnosis not present

## 2024-08-19 LAB — URINALYSIS, ROUTINE W REFLEX MICROSCOPIC
Bilirubin Urine: NEGATIVE
Glucose, UA: >=500 mg/dL — AB
Hgb urine dipstick: NEGATIVE
Ketones, ur: NEGATIVE mg/dL
Leukocytes,Ua: NEGATIVE
Nitrite: NEGATIVE
Protein, ur: NEGATIVE mg/dL
Specific Gravity, Urine: 1.01 (ref 1.005–1.030)
pH: 6 (ref 5.0–8.0)

## 2024-08-19 LAB — BASIC METABOLIC PANEL WITH GFR
Anion gap: 11 (ref 5–15)
BUN: 15 mg/dL (ref 8–23)
CO2: 29 mmol/L (ref 22–32)
Calcium: 9.9 mg/dL (ref 8.9–10.3)
Chloride: 95 mmol/L — ABNORMAL LOW (ref 98–111)
Creatinine, Ser: 1.17 mg/dL (ref 0.61–1.24)
GFR, Estimated: >60 mL/min
Glucose, Bld: 167 mg/dL — ABNORMAL HIGH (ref 70–99)
Potassium: 3.9 mmol/L (ref 3.5–5.1)
Sodium: 135 mmol/L (ref 135–145)

## 2024-08-19 LAB — URINALYSIS, MICROSCOPIC (REFLEX)
RBC / HPF: NONE SEEN RBC/hpf (ref 0–5)
WBC, UA: NONE SEEN WBC/hpf (ref 0–5)

## 2024-08-19 LAB — CBC
HCT: 43.3 % (ref 39.0–52.0)
Hemoglobin: 14.1 g/dL (ref 13.0–17.0)
MCH: 28 pg (ref 26.0–34.0)
MCHC: 32.6 g/dL (ref 30.0–36.0)
MCV: 85.9 fL (ref 80.0–100.0)
Platelets: 238 10*3/uL (ref 150–400)
RBC: 5.04 MIL/uL (ref 4.22–5.81)
RDW: 13.2 % (ref 11.5–15.5)
WBC: 7 10*3/uL (ref 4.0–10.5)
nRBC: 0 % (ref 0.0–0.2)

## 2024-08-19 LAB — CBG MONITORING, ED: Glucose-Capillary: 159 mg/dL — ABNORMAL HIGH (ref 70–99)

## 2024-08-19 NOTE — ED Provider Notes (Signed)
 " [place] EMERGENCY DEPARTMENT AT [facility] Provider Note   CSN: [id number] Arrival date & time: 08/19/24  1631     Patient presents with: Hyperglycemia   Alejandro [name] is a 64 y.o. male.  Hyperglycemia Associated symptoms: polyuria   Patient is a 64 year old male presenting ED today for concerns for hyperglycemia, noted to be currently taking Trulicity  and Jardiance.  Reports that he had a blood sugar of around 400 last night, though with his Dexcom report that it has come down to 116.  Notably said that his blood sugar was elevated because he had 3 pieces of pizza and a large piece of cake around that period of time.  Noting to have additionally reported polyuria.  Past medical history of  Hepatitis C, HTN, diabetes, HLD  Denies fever, headache, vision changes, chest pain, shortness of breath, abdominal pain, nausea, vomiting, diarrhea, dysuria, hematuria, melena, hematochezia, rashes, right lower leg swelling, discharge.     Prior to Admission medications  Medication Sig Start Date End Date Taking? Authorizing Provider  atorvastatin  (LIPITOR) 40 MG tablet Take 1 tablet (40 mg total) by mouth daily. 08/19/20   Just, Kelsea J, FNP  Blood Glucose Monitoring Suppl (ONE TOUCH ULTRA 2) w/Device KIT USE AS DIRECTED TO TEST BLOOD SUGAR IN THE MORNING 08/07/16   Isadora Krabbe D, PA  Blood Glucose Monitoring Suppl KIT 1 application by Does not apply route every morning. 04/30/20   Melonie Tori Mikel CHRISTELLA, MD  Blood Pressure Monitoring (BLOOD PRESSURE CUFF) MISC Use as directed 07/07/17   Loreli Elyn SAILOR, MD  cyclobenzaprine  (FLEXERIL ) 5 MG tablet Take 1 tablet (5 mg total) by mouth 3 (three) times daily as needed for muscle spasms. 01/14/23   White, Shelba SAUNDERS, NP  Dulaglutide  (TRULICITY ) 1.5 MG/0.5ML SOPN Inject 1.5 mg into the skin once a week. 08/19/20   Just, Kelsea J, FNP  ferrous sulfate  325 (65 FE) MG tablet Take 1 tablet (325 mg total) by mouth 2 (two) times daily.  09/28/23 10/28/23  Elgergawy, Brayton RAMAN, MD  gabapentin  (NEURONTIN ) 400 MG capsule Take 1 capsule (400 mg total) by mouth 3 (three) times daily. 08/19/20   Just, Kelsea J, FNP  glucose blood (ONETOUCH VERIO) test strip USE ONE STRIP TO CHECK GLUCOSE TWICE DAILY 04/23/19   Melonie Tori, Mikel CHRISTELLA, MD  metFORMIN  (GLUCOPHAGE ) 500 MG tablet Take 1 tablet (500 mg total) by mouth daily with breakfast. 05/25/24   Rancour, Garnette, MD  polyethylene glycol (MIRALAX ) 17 g packet Take 17 g by mouth daily as needed. 09/28/23   Elgergawy, Dawood S, MD  psyllium (METAMUCIL SMOOTH TEXTURE) 58.6 % powder Take 1 packet by mouth 3 (three) times daily. 09/28/23   Elgergawy, Brayton RAMAN, MD  valsartan  (DIOVAN ) 80 MG tablet Take 1 tablet (80 mg total) by mouth daily. 08/19/20   Just, Kelsea J, FNP    Allergies: Tylenol [acetaminophen]    Review of Systems  Endocrine: Positive for polyuria.  All other systems reviewed and are negative.   Updated Vital Signs BP 133/85 (BP Location: Right Arm)   Pulse 73   Temp 98.3 F (36.8 C) (Oral)   Resp 16   SpO2 100%   Physical Exam Vitals and nursing note reviewed.  Constitutional:      General: He is not in acute distress.    Appearance: Normal appearance. He is not ill-appearing or diaphoretic.  HENT:     Head: Normocephalic and atraumatic.  Eyes:     General: No  scleral icterus.       Right eye: No discharge.        Left eye: No discharge.     Extraocular Movements: Extraocular movements intact.     Conjunctiva/sclera: Conjunctivae normal.  Cardiovascular:     Rate and Rhythm: Normal rate and regular rhythm.     Pulses: Normal pulses.     Heart sounds: Normal heart sounds. No murmur heard.    No friction rub. No gallop.  Pulmonary:     Effort: Pulmonary effort is normal. No respiratory distress.     Breath sounds: No stridor. No wheezing, rhonchi or rales.  Chest:     Chest wall: No tenderness.  Abdominal:     General: Abdomen is flat. There is no distension.      Palpations: Abdomen is soft.     Tenderness: There is no abdominal tenderness. There is no right CVA tenderness, left CVA tenderness, guarding or rebound.  Musculoskeletal:        General: No swelling, deformity or signs of injury.     Cervical back: Normal range of motion. No rigidity.     Right lower leg: No edema.     Left lower leg: No edema.  Skin:    General: Skin is warm and dry.     Findings: No bruising, erythema or lesion.  Neurological:     General: No focal deficit present.     Mental Status: He is alert and oriented to person, place, and time. Mental status is at baseline.     Sensory: No sensory deficit.     Motor: No weakness.  Psychiatric:        Mood and Affect: Mood normal.     (all labs ordered are listed, but only abnormal results are displayed) Labs Reviewed  BASIC METABOLIC PANEL WITH GFR - Abnormal; Notable for the following components:      Result Value   Chloride 95 (*)    Glucose, Bld 167 (*)    All other components within normal limits  URINALYSIS, ROUTINE W REFLEX MICROSCOPIC - Abnormal; Notable for the following components:   Glucose, UA >=500 (*)    All other components within normal limits  URINALYSIS, MICROSCOPIC (REFLEX) - Abnormal; Notable for the following components:   Bacteria, UA RARE (*)    All other components within normal limits  CBG MONITORING, ED - Abnormal; Notable for the following components:   Glucose-Capillary 159 (*)    All other components within normal limits  CBC    EKG: None  Radiology: No results found.  Procedures   Medications Ordered in the ED - No data to display  Medical Decision Making Amount and/or Complexity of Data Reviewed Labs: ordered.   This patient is a 64 year old male who presents to the ED for concern of high blood sugar noted on his Dexcom after eating 3 pieces of pizza and multiple pieces of cake.  Presenting for concerns for high blood sugar currently on Jardiance and Trulicity .  On  physical exam, patient is in no acute distress, afebrile, alert and orient x 4, speaking in full sentences, nontachypneic, nontachycardic.  LCTAB, RRR, no murmur, no abdominal tenderness to palpation, no CVA tenderness, no leg edema.  Unremarkable exam.  Patient overall very well-appearing, with blood sugar controlled currently.  Suspecting blood sugar elevation secondary to patient's carb intake.  Low suspicion for DKA, HHS.  Lab work and urine unremarkable, no UTI.  Will have obtained follow-up with PCP for further management of  his blood sugar and start carb counting.  Patient vital signs have remained stable throughout the course of patient's time in the ED. Low suspicion for any other emergent pathology at this time. I believe this patient is safe to be discharged. Provided strict return to ER precautions. Patient expressed agreement and understanding of plan. All questions were answered.  Differential diagnoses prior to evaluation: The emergent differential diagnosis includes, but is not limited to, hyperglycemia, DKA, UTI, STI,. This is not an exhaustive differential.   Past Medical History / Co-morbidities / Social History: Chronic hepatitis C, iron  deficiency anemia, type 2 diabetes, HTN, venous insufficiency, GI bleed  Additional history: Chart reviewed. Pertinent results include:   Last seen on 05/25/2024 for hyperglycemia noting polyuria and frequency at that time as well.  Currently taking Trulicity  at that time.  Noted to have been taken off metformin  by PCP and was to follow-up with PCP after hydration and insulin  provide in the emergency department.  Lab Tests/Imaging studies: I personally interpreted labs/imaging and the pertinent results include: CBC unremarkable BMP notes a mild hyperglycemia 167 but otherwise unremarkable. UA notes glucose without any signs of UTI  Medications:   I have reviewed the patients home medicines and have made adjustments as needed.  Critical  Interventions: None  Social Determinants of Health: Has good follow-up PCP  Disposition: After consideration of the diagnostic results and the patients response to treatment, I feel that the patient would benefit from discharge and treatment as above.   emergency department workup does not suggest an emergent condition requiring admission or immediate intervention beyond what has been performed at this time. The plan is: Follow-up with PCP, return for new or worsening symptoms, carb counting. The patient is safe for discharge and has been instructed to return immediately for worsening symptoms, change in symptoms or any other concerns.  Final diagnoses:  Hyperglycemia    ED Discharge Orders     None          Beola Terrall GORMAN DEVONNA 08/19/24 1945    Ula Prentice SAUNDERS, MD 08/19/24 2253  "

## 2024-08-19 NOTE — Discharge Instructions (Addendum)
 You are seen today for high blood sugar, this is likely due to your high carb intake.  Continue to monitor your blood sugar at home, follow-up with your PCP.  Return if you been to have any profuse vomiting, severe abdominal pain, diarrhea, confusion or shortness of breath.

## 2024-08-19 NOTE — ED Notes (Signed)
 RN provided AVS using Teachback Method. Patient verbalizes understanding of Discharge Instructions. Opportunity for Questioning and Answers were provided by RN. Patient Discharged from ED ambulatory to home via self.

## 2024-08-19 NOTE — ED Triage Notes (Addendum)
 States blood sugar has been elevated w home monitor (around 400) since last night. Increased urinary frequency.  Denies pain at time of triage   A&Ox4, ambulatory
